# Patient Record
Sex: Female | Born: 1975
Health system: Southern US, Community
[De-identification: ages and names within clinical notes are randomized; demographics above are authoritative.]

## PROBLEM LIST (undated history)

## (undated) DIAGNOSIS — A599 Trichomoniasis, unspecified: Secondary | ICD-10-CM

## (undated) DIAGNOSIS — K602 Anal fissure, unspecified: Secondary | ICD-10-CM

## (undated) DIAGNOSIS — A499 Bacterial infection, unspecified: Secondary | ICD-10-CM

## (undated) DIAGNOSIS — B379 Candidiasis, unspecified: Secondary | ICD-10-CM

## (undated) DIAGNOSIS — N920 Excessive and frequent menstruation with regular cycle: Secondary | ICD-10-CM

## (undated) DIAGNOSIS — T7840XA Allergy, unspecified, initial encounter: Secondary | ICD-10-CM

## (undated) DIAGNOSIS — Z8619 Personal history of other infectious and parasitic diseases: Secondary | ICD-10-CM

## (undated) DIAGNOSIS — N809 Endometriosis, unspecified: Secondary | ICD-10-CM

## (undated) DIAGNOSIS — IMO0002 Reserved for concepts with insufficient information to code with codable children: Secondary | ICD-10-CM

## (undated) DIAGNOSIS — N760 Acute vaginitis: Secondary | ICD-10-CM

## (undated) DIAGNOSIS — K219 Gastro-esophageal reflux disease without esophagitis: Secondary | ICD-10-CM

## (undated) DIAGNOSIS — L100 Pemphigus vulgaris: Secondary | ICD-10-CM

## (undated) DIAGNOSIS — N898 Other specified noninflammatory disorders of vagina: Secondary | ICD-10-CM

## (undated) DIAGNOSIS — N946 Dysmenorrhea, unspecified: Secondary | ICD-10-CM

## (undated) HISTORY — PX: DILATION AND CURETTAGE OF UTERUS: SHX78

## (undated) HISTORY — DX: Trichomoniasis, unspecified: A59.9

## (undated) HISTORY — DX: Reserved for concepts with insufficient information to code with codable children: IMO0002

## (undated) HISTORY — DX: Other specified noninflammatory disorders of vagina: N89.8

## (undated) HISTORY — PX: TUBAL LIGATION: SHX77

## (undated) HISTORY — DX: Personal history of other infectious and parasitic diseases: Z86.19

## (undated) HISTORY — PX: WISDOM TOOTH EXTRACTION: SHX21

## (undated) HISTORY — DX: Allergy, unspecified, initial encounter: T78.40XA

## (undated) HISTORY — PX: OTHER SURGICAL HISTORY: SHX169

## (undated) HISTORY — DX: Candidiasis, unspecified: B37.9

## (undated) HISTORY — DX: Bacterial infection, unspecified: A49.9

---

## 1997-10-13 ENCOUNTER — Emergency Department (HOSPITAL_COMMUNITY): Admission: EM | Admit: 1997-10-13 | Discharge: 1997-10-13 | Payer: Self-pay | Admitting: Emergency Medicine

## 1997-11-24 ENCOUNTER — Inpatient Hospital Stay (HOSPITAL_COMMUNITY): Admission: AD | Admit: 1997-11-24 | Discharge: 1997-11-24 | Payer: Self-pay | Admitting: Family Medicine

## 1997-11-25 ENCOUNTER — Emergency Department (HOSPITAL_COMMUNITY): Admission: EM | Admit: 1997-11-25 | Discharge: 1997-11-25 | Payer: Self-pay | Admitting: Emergency Medicine

## 1997-12-06 ENCOUNTER — Emergency Department (HOSPITAL_COMMUNITY): Admission: EM | Admit: 1997-12-06 | Discharge: 1997-12-06 | Payer: Self-pay | Admitting: Emergency Medicine

## 1997-12-06 ENCOUNTER — Encounter: Payer: Self-pay | Admitting: Emergency Medicine

## 1998-03-04 ENCOUNTER — Emergency Department (HOSPITAL_COMMUNITY): Admission: EM | Admit: 1998-03-04 | Discharge: 1998-03-04 | Payer: Self-pay | Admitting: Emergency Medicine

## 1998-03-22 ENCOUNTER — Inpatient Hospital Stay (HOSPITAL_COMMUNITY): Admission: AD | Admit: 1998-03-22 | Discharge: 1998-03-22 | Payer: Self-pay | Admitting: *Deleted

## 1998-06-11 ENCOUNTER — Inpatient Hospital Stay (HOSPITAL_COMMUNITY): Admission: AD | Admit: 1998-06-11 | Discharge: 1998-06-11 | Payer: Self-pay | Admitting: Obstetrics

## 1998-06-12 ENCOUNTER — Inpatient Hospital Stay (HOSPITAL_COMMUNITY): Admission: AD | Admit: 1998-06-12 | Discharge: 1998-06-12 | Payer: Self-pay | Admitting: *Deleted

## 1998-10-01 ENCOUNTER — Inpatient Hospital Stay (HOSPITAL_COMMUNITY): Admission: AD | Admit: 1998-10-01 | Discharge: 1998-10-01 | Payer: Self-pay | Admitting: *Deleted

## 1998-10-17 ENCOUNTER — Inpatient Hospital Stay (HOSPITAL_COMMUNITY): Admission: AD | Admit: 1998-10-17 | Discharge: 1998-10-18 | Payer: Self-pay | Admitting: *Deleted

## 1998-10-20 ENCOUNTER — Emergency Department (HOSPITAL_COMMUNITY): Admission: EM | Admit: 1998-10-20 | Discharge: 1998-10-20 | Payer: Self-pay | Admitting: Emergency Medicine

## 1998-11-04 ENCOUNTER — Inpatient Hospital Stay (HOSPITAL_COMMUNITY): Admission: AD | Admit: 1998-11-04 | Discharge: 1998-11-04 | Payer: Self-pay | Admitting: *Deleted

## 1998-11-11 ENCOUNTER — Ambulatory Visit (HOSPITAL_COMMUNITY): Admission: RE | Admit: 1998-11-11 | Discharge: 1998-11-11 | Payer: Self-pay | Admitting: *Deleted

## 1998-11-15 ENCOUNTER — Inpatient Hospital Stay (HOSPITAL_COMMUNITY): Admission: AD | Admit: 1998-11-15 | Discharge: 1998-11-15 | Payer: Self-pay | Admitting: *Deleted

## 1998-11-16 ENCOUNTER — Encounter: Payer: Self-pay | Admitting: *Deleted

## 1998-11-16 ENCOUNTER — Inpatient Hospital Stay (HOSPITAL_COMMUNITY): Admission: AD | Admit: 1998-11-16 | Discharge: 1998-11-16 | Payer: Self-pay | Admitting: Obstetrics

## 1998-12-27 ENCOUNTER — Ambulatory Visit (HOSPITAL_COMMUNITY): Admission: RE | Admit: 1998-12-27 | Discharge: 1998-12-27 | Payer: Self-pay | Admitting: *Deleted

## 1999-02-01 ENCOUNTER — Inpatient Hospital Stay (HOSPITAL_COMMUNITY): Admission: AD | Admit: 1999-02-01 | Discharge: 1999-02-01 | Payer: Self-pay | Admitting: *Deleted

## 1999-03-11 ENCOUNTER — Inpatient Hospital Stay (HOSPITAL_COMMUNITY): Admission: AD | Admit: 1999-03-11 | Discharge: 1999-03-11 | Payer: Self-pay | Admitting: Obstetrics

## 1999-03-15 ENCOUNTER — Ambulatory Visit (HOSPITAL_COMMUNITY): Admission: RE | Admit: 1999-03-15 | Discharge: 1999-03-15 | Payer: Self-pay | Admitting: *Deleted

## 1999-04-04 ENCOUNTER — Inpatient Hospital Stay (HOSPITAL_COMMUNITY): Admission: AD | Admit: 1999-04-04 | Discharge: 1999-04-04 | Payer: Self-pay | Admitting: *Deleted

## 1999-04-14 ENCOUNTER — Inpatient Hospital Stay (HOSPITAL_COMMUNITY): Admission: AD | Admit: 1999-04-14 | Discharge: 1999-04-14 | Payer: Self-pay | Admitting: Obstetrics

## 1999-04-16 ENCOUNTER — Inpatient Hospital Stay (HOSPITAL_COMMUNITY): Admission: AD | Admit: 1999-04-16 | Discharge: 1999-04-16 | Payer: Self-pay | Admitting: Obstetrics & Gynecology

## 1999-04-17 ENCOUNTER — Inpatient Hospital Stay (HOSPITAL_COMMUNITY): Admission: AD | Admit: 1999-04-17 | Discharge: 1999-04-17 | Payer: Self-pay | Admitting: Obstetrics & Gynecology

## 1999-04-30 ENCOUNTER — Inpatient Hospital Stay (HOSPITAL_COMMUNITY): Admission: AD | Admit: 1999-04-30 | Discharge: 1999-04-30 | Payer: Self-pay | Admitting: Obstetrics & Gynecology

## 1999-05-06 ENCOUNTER — Inpatient Hospital Stay (HOSPITAL_COMMUNITY): Admission: AD | Admit: 1999-05-06 | Discharge: 1999-05-06 | Payer: Self-pay | Admitting: Obstetrics

## 1999-05-12 ENCOUNTER — Inpatient Hospital Stay (HOSPITAL_COMMUNITY): Admission: AD | Admit: 1999-05-12 | Discharge: 1999-05-12 | Payer: Self-pay | Admitting: Obstetrics

## 1999-05-12 ENCOUNTER — Ambulatory Visit (HOSPITAL_COMMUNITY): Admission: RE | Admit: 1999-05-12 | Discharge: 1999-05-12 | Payer: Self-pay | Admitting: *Deleted

## 1999-05-20 ENCOUNTER — Inpatient Hospital Stay (HOSPITAL_COMMUNITY): Admission: AD | Admit: 1999-05-20 | Discharge: 1999-05-20 | Payer: Self-pay | Admitting: Obstetrics & Gynecology

## 1999-05-22 ENCOUNTER — Encounter (INDEPENDENT_AMBULATORY_CARE_PROVIDER_SITE_OTHER): Payer: Self-pay | Admitting: Specialist

## 1999-05-22 ENCOUNTER — Inpatient Hospital Stay (HOSPITAL_COMMUNITY): Admission: AD | Admit: 1999-05-22 | Discharge: 1999-05-24 | Payer: Self-pay | Admitting: Obstetrics & Gynecology

## 1999-05-29 ENCOUNTER — Inpatient Hospital Stay (HOSPITAL_COMMUNITY): Admission: AD | Admit: 1999-05-29 | Discharge: 1999-05-29 | Payer: Self-pay | Admitting: Obstetrics

## 1999-08-28 ENCOUNTER — Emergency Department (HOSPITAL_COMMUNITY): Admission: EM | Admit: 1999-08-28 | Discharge: 1999-08-28 | Payer: Self-pay

## 2001-01-07 ENCOUNTER — Other Ambulatory Visit: Admission: RE | Admit: 2001-01-07 | Discharge: 2001-01-07 | Payer: Self-pay | Admitting: Obstetrics and Gynecology

## 2001-05-20 ENCOUNTER — Other Ambulatory Visit: Admission: RE | Admit: 2001-05-20 | Discharge: 2001-05-20 | Payer: Self-pay | Admitting: Obstetrics and Gynecology

## 2001-06-27 ENCOUNTER — Encounter (INDEPENDENT_AMBULATORY_CARE_PROVIDER_SITE_OTHER): Payer: Self-pay

## 2001-06-27 ENCOUNTER — Ambulatory Visit (HOSPITAL_COMMUNITY): Admission: RE | Admit: 2001-06-27 | Discharge: 2001-06-27 | Payer: Self-pay | Admitting: Obstetrics and Gynecology

## 2001-12-09 ENCOUNTER — Emergency Department (HOSPITAL_COMMUNITY): Admission: EM | Admit: 2001-12-09 | Discharge: 2001-12-10 | Payer: Self-pay | Admitting: Emergency Medicine

## 2001-12-10 ENCOUNTER — Emergency Department (HOSPITAL_COMMUNITY): Admission: EM | Admit: 2001-12-10 | Discharge: 2001-12-10 | Payer: Self-pay | Admitting: Emergency Medicine

## 2002-02-04 ENCOUNTER — Encounter: Payer: Self-pay | Admitting: Emergency Medicine

## 2002-02-04 ENCOUNTER — Emergency Department (HOSPITAL_COMMUNITY): Admission: EM | Admit: 2002-02-04 | Discharge: 2002-02-04 | Payer: Self-pay | Admitting: Emergency Medicine

## 2002-03-05 DIAGNOSIS — R87619 Unspecified abnormal cytological findings in specimens from cervix uteri: Secondary | ICD-10-CM

## 2002-03-05 DIAGNOSIS — IMO0002 Reserved for concepts with insufficient information to code with codable children: Secondary | ICD-10-CM

## 2002-03-05 HISTORY — DX: Reserved for concepts with insufficient information to code with codable children: IMO0002

## 2002-03-05 HISTORY — DX: Unspecified abnormal cytological findings in specimens from cervix uteri: R87.619

## 2002-12-21 ENCOUNTER — Emergency Department (HOSPITAL_COMMUNITY): Admission: EM | Admit: 2002-12-21 | Discharge: 2002-12-21 | Payer: Self-pay | Admitting: Emergency Medicine

## 2003-09-27 ENCOUNTER — Emergency Department (HOSPITAL_COMMUNITY): Admission: EM | Admit: 2003-09-27 | Discharge: 2003-09-27 | Payer: Self-pay | Admitting: Emergency Medicine

## 2004-11-15 ENCOUNTER — Inpatient Hospital Stay (HOSPITAL_COMMUNITY): Admission: AD | Admit: 2004-11-15 | Discharge: 2004-11-15 | Payer: Self-pay | Admitting: Family Medicine

## 2005-01-01 ENCOUNTER — Emergency Department (HOSPITAL_COMMUNITY): Admission: EM | Admit: 2005-01-01 | Discharge: 2005-01-01 | Payer: Self-pay | Admitting: Emergency Medicine

## 2005-01-05 ENCOUNTER — Emergency Department (HOSPITAL_COMMUNITY): Admission: EM | Admit: 2005-01-05 | Discharge: 2005-01-05 | Payer: Self-pay | Admitting: Family Medicine

## 2005-02-24 ENCOUNTER — Emergency Department (HOSPITAL_COMMUNITY): Admission: EM | Admit: 2005-02-24 | Discharge: 2005-02-24 | Payer: Self-pay | Admitting: Emergency Medicine

## 2005-04-12 ENCOUNTER — Emergency Department (HOSPITAL_COMMUNITY): Admission: EM | Admit: 2005-04-12 | Discharge: 2005-04-12 | Payer: Self-pay | Admitting: Family Medicine

## 2005-05-16 ENCOUNTER — Emergency Department (HOSPITAL_COMMUNITY): Admission: EM | Admit: 2005-05-16 | Discharge: 2005-05-16 | Payer: Self-pay | Admitting: Emergency Medicine

## 2005-05-31 ENCOUNTER — Emergency Department (HOSPITAL_COMMUNITY): Admission: EM | Admit: 2005-05-31 | Discharge: 2005-05-31 | Payer: Self-pay | Admitting: Family Medicine

## 2005-08-25 ENCOUNTER — Emergency Department (HOSPITAL_COMMUNITY): Admission: EM | Admit: 2005-08-25 | Discharge: 2005-08-25 | Payer: Self-pay | Admitting: Emergency Medicine

## 2005-09-17 ENCOUNTER — Emergency Department (HOSPITAL_COMMUNITY): Admission: EM | Admit: 2005-09-17 | Discharge: 2005-09-17 | Payer: Self-pay | Admitting: Family Medicine

## 2006-07-12 ENCOUNTER — Inpatient Hospital Stay (HOSPITAL_COMMUNITY): Admission: AD | Admit: 2006-07-12 | Discharge: 2006-07-12 | Payer: Self-pay | Admitting: Obstetrics & Gynecology

## 2006-08-12 ENCOUNTER — Emergency Department (HOSPITAL_COMMUNITY): Admission: EM | Admit: 2006-08-12 | Discharge: 2006-08-12 | Payer: Self-pay | Admitting: Family Medicine

## 2006-09-25 ENCOUNTER — Emergency Department (HOSPITAL_COMMUNITY): Admission: EM | Admit: 2006-09-25 | Discharge: 2006-09-25 | Payer: Self-pay | Admitting: Emergency Medicine

## 2007-04-08 DIAGNOSIS — N898 Other specified noninflammatory disorders of vagina: Secondary | ICD-10-CM

## 2007-04-08 HISTORY — DX: Other specified noninflammatory disorders of vagina: N89.8

## 2007-05-18 ENCOUNTER — Emergency Department (HOSPITAL_COMMUNITY): Admission: EM | Admit: 2007-05-18 | Discharge: 2007-05-18 | Payer: Self-pay | Admitting: Emergency Medicine

## 2008-03-06 ENCOUNTER — Emergency Department (HOSPITAL_COMMUNITY): Admission: EM | Admit: 2008-03-06 | Discharge: 2008-03-06 | Payer: Self-pay | Admitting: Emergency Medicine

## 2008-03-24 ENCOUNTER — Inpatient Hospital Stay (HOSPITAL_COMMUNITY): Admission: AD | Admit: 2008-03-24 | Discharge: 2008-03-24 | Payer: Self-pay | Admitting: Anesthesiology

## 2008-03-28 ENCOUNTER — Encounter (INDEPENDENT_AMBULATORY_CARE_PROVIDER_SITE_OTHER): Payer: Self-pay | Admitting: Emergency Medicine

## 2008-03-28 ENCOUNTER — Emergency Department (HOSPITAL_COMMUNITY): Admission: EM | Admit: 2008-03-28 | Discharge: 2008-03-28 | Payer: Self-pay | Admitting: Emergency Medicine

## 2008-03-28 ENCOUNTER — Ambulatory Visit: Payer: Self-pay | Admitting: Vascular Surgery

## 2008-10-19 ENCOUNTER — Emergency Department (HOSPITAL_COMMUNITY): Admission: EM | Admit: 2008-10-19 | Discharge: 2008-10-19 | Payer: Self-pay | Admitting: Emergency Medicine

## 2008-10-23 ENCOUNTER — Emergency Department (HOSPITAL_COMMUNITY): Admission: EM | Admit: 2008-10-23 | Discharge: 2008-10-23 | Payer: Self-pay | Admitting: Family Medicine

## 2008-11-28 ENCOUNTER — Emergency Department (HOSPITAL_COMMUNITY): Admission: EM | Admit: 2008-11-28 | Discharge: 2008-11-28 | Payer: Self-pay | Admitting: Emergency Medicine

## 2008-12-01 ENCOUNTER — Emergency Department (HOSPITAL_COMMUNITY): Admission: EM | Admit: 2008-12-01 | Discharge: 2008-12-01 | Payer: Self-pay | Admitting: Family Medicine

## 2008-12-11 ENCOUNTER — Emergency Department (HOSPITAL_BASED_OUTPATIENT_CLINIC_OR_DEPARTMENT_OTHER): Admission: EM | Admit: 2008-12-11 | Discharge: 2008-12-11 | Payer: Self-pay | Admitting: Emergency Medicine

## 2008-12-14 ENCOUNTER — Ambulatory Visit: Payer: Self-pay | Admitting: Advanced Practice Midwife

## 2008-12-14 ENCOUNTER — Inpatient Hospital Stay (HOSPITAL_COMMUNITY): Admission: AD | Admit: 2008-12-14 | Discharge: 2008-12-14 | Payer: Self-pay | Admitting: Obstetrics & Gynecology

## 2009-08-14 ENCOUNTER — Emergency Department (HOSPITAL_COMMUNITY): Admission: EM | Admit: 2009-08-14 | Discharge: 2009-08-14 | Payer: Self-pay | Admitting: Emergency Medicine

## 2010-03-18 ENCOUNTER — Emergency Department (HOSPITAL_COMMUNITY)
Admission: EM | Admit: 2010-03-18 | Discharge: 2010-03-18 | Payer: Self-pay | Source: Home / Self Care | Admitting: Emergency Medicine

## 2010-05-29 DIAGNOSIS — R946 Abnormal results of thyroid function studies: Secondary | ICD-10-CM | POA: Insufficient documentation

## 2010-05-29 DIAGNOSIS — E559 Vitamin D deficiency, unspecified: Secondary | ICD-10-CM | POA: Insufficient documentation

## 2010-06-08 LAB — WET PREP, GENITAL
Trich, Wet Prep: NONE SEEN
Yeast Wet Prep HPF POC: NONE SEEN

## 2010-06-10 LAB — BASIC METABOLIC PANEL
BUN: 9 mg/dL (ref 6–23)
CO2: 26 mEq/L (ref 19–32)
Calcium: 8.6 mg/dL (ref 8.4–10.5)
Chloride: 105 mEq/L (ref 96–112)
Creatinine, Ser: 0.74 mg/dL (ref 0.4–1.2)
GFR calc Af Amer: >60 mL/min (ref >60)
GFR calc non Af Amer: >60 mL/min (ref >60)
Glucose, Bld: 96 mg/dL (ref 70–99)
Potassium: 3.8 mEq/L (ref 3.5–5.1)
Sodium: 136 mEq/L (ref 135–145)

## 2010-06-10 LAB — CBC
HCT: 35.5 % — ABNORMAL LOW (ref 36.0–46.0)
Hemoglobin: 12.3 g/dL (ref 12.0–15.0)
MCHC: 34.6 g/dL (ref 30.0–36.0)
MCV: 93.7 fL (ref 78.0–100.0)
Platelets: 337 10*3/uL (ref 150–400)
RBC: 3.79 MIL/uL — ABNORMAL LOW (ref 3.87–5.11)
RDW: 12.8 % (ref 11.5–15.5)
WBC: 5.6 10*3/uL (ref 4.0–10.5)

## 2010-06-10 LAB — DIFFERENTIAL
Basophils Absolute: 0 10*3/uL (ref 0.0–0.1)
Basophils Relative: 0 % (ref 0–1)
Eosinophils Absolute: 0.1 10*3/uL (ref 0.0–0.7)
Eosinophils Relative: 1 % (ref 0–5)
Lymphocytes Relative: 30 % (ref 12–46)
Lymphs Abs: 1.7 10*3/uL (ref 0.7–4.0)
Monocytes Absolute: 0.5 10*3/uL (ref 0.1–1.0)
Monocytes Relative: 10 % (ref 3–12)
Neutro Abs: 3.3 10*3/uL (ref 1.7–7.7)
Neutrophils Relative %: 59 % (ref 43–77)

## 2010-06-10 LAB — URINALYSIS, ROUTINE W REFLEX MICROSCOPIC
Bilirubin Urine: NEGATIVE
Bilirubin Urine: NEGATIVE
Glucose, UA: NEGATIVE mg/dL
Glucose, UA: NEGATIVE mg/dL
Hgb urine dipstick: NEGATIVE
Hgb urine dipstick: NEGATIVE
Ketones, ur: NEGATIVE mg/dL
Nitrite: NEGATIVE
Nitrite: NEGATIVE
Protein, ur: NEGATIVE mg/dL
Protein, ur: NEGATIVE mg/dL
Specific Gravity, Urine: 1.012 (ref 1.005–1.030)
Specific Gravity, Urine: 1.031 — ABNORMAL HIGH (ref 1.005–1.030)
Urobilinogen, UA: 0.2 mg/dL (ref 0.0–1.0)
Urobilinogen, UA: 0.2 mg/dL (ref 0.0–1.0)
pH: 6 (ref 5.0–8.0)
pH: 7 (ref 5.0–8.0)

## 2010-06-10 LAB — PREGNANCY, URINE: Preg Test, Ur: NEGATIVE

## 2010-06-10 LAB — POCT PREGNANCY, URINE: Preg Test, Ur: NEGATIVE

## 2010-06-19 LAB — URINALYSIS, ROUTINE W REFLEX MICROSCOPIC
Bilirubin Urine: NEGATIVE
Glucose, UA: NEGATIVE mg/dL
Hgb urine dipstick: NEGATIVE
Ketones, ur: NEGATIVE mg/dL
Nitrite: NEGATIVE
Protein, ur: NEGATIVE mg/dL
Specific Gravity, Urine: >1.03 — ABNORMAL HIGH (ref 1.005–1.030)
Urobilinogen, UA: 0.2 mg/dL (ref 0.0–1.0)
pH: 6 (ref 5.0–8.0)

## 2010-06-19 LAB — POCT PREGNANCY, URINE: Preg Test, Ur: NEGATIVE

## 2010-06-19 LAB — WET PREP, GENITAL
Clue Cells Wet Prep HPF POC: NONE SEEN
Trich, Wet Prep: NONE SEEN
Yeast Wet Prep HPF POC: NONE SEEN

## 2010-06-19 LAB — GC/CHLAMYDIA PROBE AMP, GENITAL
Chlamydia, DNA Probe: NEGATIVE
GC Probe Amp, Genital: NEGATIVE

## 2010-07-21 NOTE — Op Note (Signed)
[facility]  Patient:    Christina Allen, Christina Allen                          MRN: [id number] Proc. Date: 05/23/99 Adm. Date:  [id number] Attending:  Antionette Char                           Operative Report  PREOPERATIVE DIAGNOSIS:       Desire for surgical sterilization.  POSTOPERATIVE DIAGNOSIS:      Desire for surgical sterilization.  OPERATION:                    Modified bilateral Pomeroy tubal ligation.  SURGEON:                      Conni Elliot, M.D.  ASSISTANT:  ANESTHESIA:  ESTIMATED BLOOD LOSS:         Approximately 10 cc.  OPERATIVE FINDINGS:           Uterus, tubes and ovaries were normal for post gravid state; however, there were adhesions to the omentum overlying the adnexa as well as adhesions to the anterior abdominal wall of the omentum.  INDICATIONS:  DESCRIPTION OF PROCEDURE:     After continuous lumbar epidural anesthetic, the patient supine in the left lateral tilt position, the abdomen was prepped and draped in a sterile fashion.  A periumbilical incision was made.  Incision was ade through the skin and subcutaneous fascia.  Cavity entered. The right fallopian ube was identified, grasped with Babcock clamps and followed out to its fimbriated nd. Segment of tube was brought into the operative field, double suture ligated and an approximately 1 cm segment was incised and hemostasis was adequate.  The same procedure was done on the left. Hemostasis was adequate.  Anterior peritoneum and fascia in routine fashion.  Needle and instrument was correct. DD:  05/23/99 TD:  05/24/99 Job: [id number]

## 2010-07-21 NOTE — Discharge Summary (Signed)
[facility] [place]  Patient:    Christina Allen, Christina Allen                          MRN: [id number] Adm. Date:  [id number] Disc. Date: [id number] Attending:  Antionette Char Dictator:   Pollyann Savoy, P.A.-S.                           Discharge Summary  HISTORY OF PRESENT ILLNESS:   This patient is a 35 year old, gravida 3, para 2-1-0-3, admitted at 39-6/7 weeks for the onset of labor.  HOSPITAL COURSE:              The patient progressed to completion and delivered a viable female under an epidural anesthesia, NSVD. The infants Apgars were 9 at ne minute and 9 at five minutes.  Nuchal cord x 1 was reduced.  Estimated blood loss was about 500 cc.  There were no tears or abrasions and no complications.  The following day, May 23, 1999, the patient underwent BTL performed by Conni Elliot, M.D.  The procedure was successful with no complications.  On physical  examination, the patient is a well-dressed, well-nourished female in no acute distress.  Afebrile, vital signs are stable.  Heart is regular rate and rhythm.  Lungs are clear to auscultation bilaterally.  The abdomen was gravid and nontender. On extremities, there was no clubbing, cyanosis, or edema.  Reflexes were 2+, pulses 2+ with no clonus.  On admission, the patients cervical examination was about 3 to 4 cm, 60% effaced, station -1 and the patient was contracting regularly about every four to six minutes with no decellerations.  Fetal heart rate in the 140s to 160s, strip reassuring.  There were no complications during the hospitalization.  The patient will breastfeed her female infant.  Postoperative CBC showed an H&H of 10.2 and 28.9.  The patient was discharged on May 24, 1999, n good condition.  The patient was discharged to home accompanied by her female infant.  DISCHARGE MEDICATIONS:        1. Tylox one to two q.4-6h. p.r.n.                               2. Ibuprofen 600 mg q.6h.                         3. Iron sulfate 325 mg b.i.d.                               4. Prenatal vitamins one q.d.  The patient was given routine instructions for pelvic rest x 6 weeks, regular diet, and signs and symptoms of infection wound care for her BTL.  The patient will follow up at [facility] in six weeks. DD:  05/25/99 TD:  05/25/99 Job: [id number]

## 2010-07-21 NOTE — H&P (Signed)
[facility]  Patient:    Christina Allen, Christina Allen Visit Number: [id number] MRN: [id number]          Service Type: DSU Location: [facility] Attending Physician:  Wandalee Ferdinand Dictated by:   Rudy Jew Ashley Royalty, M.D. Admit Date:  06/27/2001                           History and Physical  HISTORY OF PRESENT ILLNESS:   This is a 35 year old gravida 3, para 3, who presented for colposcopy June 09, 2001, due to a recent Pap smear in March 2003 revealing CIN-I after a December 2002 LEEP.  Exam was limited to colposcopy on June 09, 2001.  The study showed that the squamocolumnar junction was somewhat recessed apparently as a consequence of her previous LEEP.  There were no _____ lesions noted.  The cervix was then bathed with 5% acetic acid and revisualized through the colposcope.  Colposcopically- directed biopsies revealed CIN-I.  The operator wondered if he could see areas of white epithelium at the squamocolumnar junction at 11 and 5 oclock. However, the exam was difficult due to the recessed nature of the squamocolumnar junction.  The pathology returned December 2002 showing mild dysplasia, margins not involved.  The patient was admitted for cold knife conization.  MEDICATIONS:                  Prednisone.  PAST MEDICAL HISTORY:         Pemphigus vulgaris.  ALLERGIES:                    PENICILLIN.  FAMILY HISTORY:               Noncontributory.  SOCIAL HISTORY:               The patient denies the use of significant alcohol.  She does smoke cigarettes.  REVIEW OF SYSTEMS:            Noncontributory.  PHYSICAL EXAMINATION:  VITAL SIGNS:                  Afebrile.  Vital signs stable.  GENERAL:                      A well-developed, well-nourished white female in no acute distress.  SKIN:                         Warm and dry without lesions.  LYMPHATIC:                    There was no supraclavicular, cervical, or inguinal adenopathy.  HEENT:                         Normocephalic.  NECK:                         Supple without thyromegaly.  CHEST:                        Lungs clear.  CARDIAC:                      Regular rate and rhythm without murmurs, gallops, or rubs.  BREASTS:  Deferred.  ABDOMEN:                      Soft and nontender without masses or organomegaly.  Bowel sounds were active.  MUSCULOSKELETAL:              Full range of motion without any cyanosis or significant edema or tenderness.  PELVIC:                       External genitalia is within normal limits.  The vagina and cervix are without gross lesions.  Pap is done.  Bimanual examination reveals the uterus to be approximately 8 x 4.4 cm.  No adnexal masses were palpable.  Rectovaginal exam confirms.  IMPRESSION:                   1. History of loop electrosurgical excision                                  procedure for cervical intraepithelial                                  neoplasia.                               2. Cervical intraepithelial neoplasia I on                                  Papanicolaou smear with colposcopically-                                  directed biopsy revealing rare detached                                  atypical squamous fragments suspicious for                                  low-grade dysplasia.  PLAN:                         As patient has already had a LEEP in the past and a tall, skinny cone is recommended, the procedure will be to allow the conization.  The risks, benefits of conization were fully discussed.  The patient states she understands and accepts.  Questions invited and answered. Dictated by:   Rudy Jew Ashley Royalty, M.D. Attending Physician:  Wandalee Ferdinand DD:  06/27/01 TD:  06/27/01 Job: [id number]

## 2010-07-21 NOTE — Op Note (Signed)
[facility]  Patient:    Christina Allen, Christina Allen Visit Number: [id number] MRN: [id number]          Service Type: DSU Location: [facility] Attending Physician:  Wandalee Ferdinand Dictated by:   Rudy Jew Ashley Royalty, M.D. Proc. Date: 06/27/01 Admit Date:  06/27/2001                             Operative Report  PREOPERATIVE DIAGNOSIS:       1. CIN-I with detached atypical squamous fragments. Pathology is pending.                               2. Inadequate colposcopy.  POSTOPERATIVE DIAGNOSES:      1. CIN-I with detached atypical squamous fragments.  Pathology is pending.                               2. Inadequate colposcopy.  PROCEDURE:                    Cold knife conization.  SURGEON:                      Rudy Jew. Ashley Royalty, M.D.  ANESTHESIA:                   Monitored anesthesia care with 1% Xylocaine paracervical block (10 cc).  ESTIMATED BLOOD LOSS:         50 cc.  COMPLICATIONS:                None.  PACKS/DRAINS:                 None.  DESCRIPTION OF PROCEDURE:     The patient was taken to the operating room and placed in the dorsal supine position.  After adequate IV sedation was administered, she was placed in the lithotomy position and prepped and draped in the usual sterile fashion for vaginal surgery.  Hibiclens was used instead of Betadine.  The cervix was grasped at 3 and 9 oclock and hemostatic sutures of 2-0 chromic were placed without difficulty.  The cervix was then bathed with Lugol solution.  There were no nonstaining areas noted.  Next, a tall skinny cone was taken circumferentially around the cervix with the #11 blade.  It was cut off high in the cervical canal with Mayo scissors; submitted to pathology for histologic studies.  It was marked at 12 oclock with a suture.  Next, an attempt was made to cauterize the cone; however, it was not cooperative in terms of bleeding cessation.  Hence, four quadrant sutures of 2-0 chromic  were placed, each occupying one quadrant.  Hemostasis was noted. The procedure was then terminated.  All vaginal instruments were removed and the patient was sent to recovery room in excellent condition. Dictated by:   Rudy Jew Ashley Royalty, M.D. Attending Physician:  Wandalee Ferdinand DD:  06/27/01 TD:  06/28/01 Job: [id number]

## 2010-09-28 ENCOUNTER — Inpatient Hospital Stay (INDEPENDENT_AMBULATORY_CARE_PROVIDER_SITE_OTHER)
Admission: RE | Admit: 2010-09-28 | Discharge: 2010-09-28 | Disposition: A | Discharge status: Home / Self Care | Payer: Medicaid Other | Source: Ambulatory Visit | Attending: Family Medicine | Admitting: Family Medicine

## 2010-09-28 DIAGNOSIS — N390 Urinary tract infection, site not specified: Secondary | ICD-10-CM

## 2010-09-28 LAB — POCT URINALYSIS DIP (DEVICE)
Bilirubin Urine: NEGATIVE
Glucose, UA: NEGATIVE mg/dL
Ketones, ur: NEGATIVE mg/dL
Leukocytes, UA: NEGATIVE
Nitrite: POSITIVE — AB
Protein, ur: NEGATIVE mg/dL
Specific Gravity, Urine: 1.025 (ref 1.005–1.030)
Urobilinogen, UA: 0.2 mg/dL (ref 0.0–1.0)
pH: 5.5 (ref 5.0–8.0)

## 2010-09-28 LAB — POCT PREGNANCY, URINE: Preg Test, Ur: NEGATIVE

## 2010-11-27 LAB — URINALYSIS, ROUTINE W REFLEX MICROSCOPIC
Bilirubin Urine: NEGATIVE
Glucose, UA: NEGATIVE
Hgb urine dipstick: NEGATIVE
Ketones, ur: NEGATIVE
Nitrite: NEGATIVE
Protein, ur: NEGATIVE
Specific Gravity, Urine: 1.024
Urobilinogen, UA: 0.2
pH: 6.5

## 2010-11-27 LAB — CBC
HCT: 35.7 — ABNORMAL LOW
Hemoglobin: 12.4
MCHC: 34.8
MCV: 92.9
Platelets: 357
RBC: 3.84 — ABNORMAL LOW
RDW: 12.5
WBC: 3.4 — ABNORMAL LOW

## 2010-11-27 LAB — DIFFERENTIAL
Basophils Absolute: 0
Basophils Relative: 1
Eosinophils Absolute: 0.1
Eosinophils Relative: 2
Lymphocytes Relative: 41
Lymphs Abs: 1.4
Monocytes Absolute: 0.4
Monocytes Relative: 11
Neutro Abs: 1.5 — ABNORMAL LOW
Neutrophils Relative %: 45

## 2010-11-27 LAB — WET PREP, GENITAL
Clue Cells Wet Prep HPF POC: NONE SEEN
Trich, Wet Prep: NONE SEEN
Yeast Wet Prep HPF POC: NONE SEEN

## 2010-11-27 LAB — RPR: RPR Ser Ql: NONREACTIVE

## 2010-11-27 LAB — GC/CHLAMYDIA PROBE AMP, GENITAL
Chlamydia, DNA Probe: NEGATIVE
GC Probe Amp, Genital: NEGATIVE

## 2010-11-27 LAB — PREGNANCY, URINE: Preg Test, Ur: NEGATIVE

## 2010-12-18 LAB — URINE MICROSCOPIC-ADD ON

## 2010-12-18 LAB — WET PREP, GENITAL: Yeast Wet Prep HPF POC: NONE SEEN

## 2010-12-18 LAB — GC/CHLAMYDIA PROBE AMP, GENITAL
Chlamydia, DNA Probe: NEGATIVE
GC Probe Amp, Genital: NEGATIVE

## 2010-12-18 LAB — URINALYSIS, ROUTINE W REFLEX MICROSCOPIC
Bilirubin Urine: NEGATIVE
Glucose, UA: NEGATIVE
Hgb urine dipstick: NEGATIVE
Ketones, ur: NEGATIVE
Nitrite: NEGATIVE
Protein, ur: NEGATIVE
Specific Gravity, Urine: 1.015
Urobilinogen, UA: 1
pH: 7

## 2010-12-18 LAB — RPR: RPR Ser Ql: NONREACTIVE

## 2010-12-18 LAB — PREGNANCY, URINE: Preg Test, Ur: NEGATIVE

## 2010-12-21 LAB — POCT URINALYSIS DIP (DEVICE)
Bilirubin Urine: NEGATIVE
Glucose, UA: NEGATIVE
Ketones, ur: NEGATIVE
Nitrite: NEGATIVE
Operator id: 235561
Protein, ur: 30 — AB
Specific Gravity, Urine: >=1.03
Urobilinogen, UA: 1
pH: 6

## 2010-12-21 LAB — GC/CHLAMYDIA PROBE AMP, GENITAL
Chlamydia, DNA Probe: NEGATIVE
GC Probe Amp, Genital: POSITIVE — AB

## 2010-12-21 LAB — WET PREP, GENITAL: Yeast Wet Prep HPF POC: NONE SEEN

## 2010-12-21 LAB — POCT PREGNANCY, URINE
Operator id: 235561
Preg Test, Ur: NEGATIVE

## 2011-03-05 ENCOUNTER — Other Ambulatory Visit: Payer: Medicaid Other

## 2011-03-05 ENCOUNTER — Other Ambulatory Visit: Payer: Self-pay | Admitting: Orthopaedic Surgery

## 2011-03-05 ENCOUNTER — Ambulatory Visit
Admission: RE | Admit: 2011-03-05 | Discharge: 2011-03-05 | Disposition: A | Discharge status: Home / Self Care | Payer: Medicaid Other | Source: Ambulatory Visit | Attending: Orthopaedic Surgery | Admitting: Orthopaedic Surgery

## 2011-03-05 DIAGNOSIS — M25569 Pain in unspecified knee: Secondary | ICD-10-CM

## 2011-06-10 ENCOUNTER — Encounter (HOSPITAL_COMMUNITY): Payer: Self-pay | Admitting: *Deleted

## 2011-06-10 ENCOUNTER — Emergency Department (HOSPITAL_COMMUNITY)
Admission: EM | Admit: 2011-06-10 | Discharge: 2011-06-10 | Disposition: A | Discharge status: Home / Self Care | Payer: Medicaid Other | Attending: Emergency Medicine | Admitting: Emergency Medicine

## 2011-06-10 DIAGNOSIS — K5289 Other specified noninfective gastroenteritis and colitis: Secondary | ICD-10-CM | POA: Insufficient documentation

## 2011-06-10 DIAGNOSIS — K529 Noninfective gastroenteritis and colitis, unspecified: Secondary | ICD-10-CM

## 2011-06-10 DIAGNOSIS — K219 Gastro-esophageal reflux disease without esophagitis: Secondary | ICD-10-CM | POA: Insufficient documentation

## 2011-06-10 HISTORY — DX: Gastro-esophageal reflux disease without esophagitis: K21.9

## 2011-06-10 LAB — COMPREHENSIVE METABOLIC PANEL
ALT: 17 U/L (ref 0–35)
AST: 21 U/L (ref 0–37)
Albumin: 4 g/dL (ref 3.5–5.2)
Alkaline Phosphatase: 77 U/L (ref 39–117)
BUN: 15 mg/dL (ref 6–23)
CO2: 20 mEq/L (ref 19–32)
Calcium: 9.4 mg/dL (ref 8.4–10.5)
Chloride: 105 mEq/L (ref 96–112)
Creatinine, Ser: 0.87 mg/dL (ref 0.50–1.10)
GFR calc Af Amer: >90 mL/min (ref >90)
GFR calc non Af Amer: 87 mL/min — ABNORMAL LOW (ref >90)
Glucose, Bld: 98 mg/dL (ref 70–99)
Potassium: 4.1 mEq/L (ref 3.5–5.1)
Sodium: 137 mEq/L (ref 135–145)
Total Bilirubin: 0.4 mg/dL (ref 0.3–1.2)
Total Protein: 7.7 g/dL (ref 6.0–8.3)

## 2011-06-10 LAB — LIPASE, BLOOD: Lipase: 20 U/L (ref 11–59)

## 2011-06-10 LAB — DIFFERENTIAL
Basophils Absolute: 0 10*3/uL (ref 0.0–0.1)
Basophils Relative: 0 % (ref 0–1)
Eosinophils Absolute: 0 10*3/uL (ref 0.0–0.7)
Eosinophils Relative: 0 % (ref 0–5)
Lymphocytes Relative: 13 % (ref 12–46)
Lymphs Abs: 1.2 10*3/uL (ref 0.7–4.0)
Monocytes Absolute: 0.6 10*3/uL (ref 0.1–1.0)
Monocytes Relative: 6 % (ref 3–12)
Neutro Abs: 7.4 10*3/uL (ref 1.7–7.7)
Neutrophils Relative %: 81 % — ABNORMAL HIGH (ref 43–77)

## 2011-06-10 LAB — URINALYSIS, ROUTINE W REFLEX MICROSCOPIC
Bilirubin Urine: NEGATIVE
Glucose, UA: NEGATIVE mg/dL
Hgb urine dipstick: NEGATIVE
Ketones, ur: NEGATIVE mg/dL
Leukocytes, UA: NEGATIVE
Nitrite: NEGATIVE
Protein, ur: NEGATIVE mg/dL
Specific Gravity, Urine: 1.029 (ref 1.005–1.030)
Urobilinogen, UA: 0.2 mg/dL (ref 0.0–1.0)
pH: 5.5 (ref 5.0–8.0)

## 2011-06-10 LAB — CBC
HCT: 40.3 % (ref 36.0–46.0)
Hemoglobin: 14.3 g/dL (ref 12.0–15.0)
MCH: 31.7 pg (ref 26.0–34.0)
MCHC: 35.5 g/dL (ref 30.0–36.0)
MCV: 89.4 fL (ref 78.0–100.0)
Platelets: 395 10*3/uL (ref 150–400)
RBC: 4.51 MIL/uL (ref 3.87–5.11)
RDW: 12.6 % (ref 11.5–15.5)
WBC: 9.2 10*3/uL (ref 4.0–10.5)

## 2011-06-10 LAB — POCT PREGNANCY, URINE: Preg Test, Ur: NEGATIVE

## 2011-06-10 MED ORDER — SODIUM CHLORIDE 0.9 % IV BOLUS (SEPSIS)
1000.0000 mL | Freq: Once | INTRAVENOUS | Status: AC
  Administered 2011-06-10: 1000 mL via INTRAVENOUS

## 2011-06-10 MED ORDER — ONDANSETRON HCL 4 MG PO TABS: 4.0000 mg | ORAL_TABLET | Freq: Four times a day (QID) | ORAL | Status: AC

## 2011-06-10 MED ORDER — TRAMADOL HCL 50 MG PO TABS: 50.0000 mg | ORAL_TABLET | Freq: Four times a day (QID) | ORAL | Status: AC | PRN

## 2011-06-10 MED ORDER — MORPHINE SULFATE 4 MG/ML IJ SOLN
4.0000 mg | Freq: Once | INTRAMUSCULAR | Status: AC
  Administered 2011-06-10: 4 mg via INTRAVENOUS
  Filled 2011-06-10: qty 1

## 2011-06-10 MED ORDER — ONDANSETRON HCL 4 MG/2ML IJ SOLN
4.0000 mg | Freq: Once | INTRAMUSCULAR | Status: AC
  Administered 2011-06-10: 4 mg via INTRAVENOUS
  Filled 2011-06-10: qty 2

## 2011-06-10 NOTE — ED Provider Notes (Signed)
Medical screening examination/treatment/procedure(s) were performed by non-physician practitioner and as supervising physician I was immediately available for consultation/collaboration.   Carleene Cooper III, MD 06/10/11 2239

## 2011-06-10 NOTE — ED Provider Notes (Signed)
History     CSN: [id number]  Arrival date & time 06/10/11  1136   First MD Initiated Contact with Patient 06/10/11 1249      Chief Complaint  Patient presents with  . Abdominal Pain  . Diarrhea  . Emesis    (Consider location/radiation/quality/duration/timing/severity/associated sxs/prior treatment) HPI  36 year old female presents with a chief complaints of nausea, vomiting, diarrhea, and abdominal pain. Patient states she was at a cookout yesterday. States she did eat a moderate amount of food. This morning when she woke up she experienced diffuse abdominal pain with associated nausea, nonbloody nonbilious vomiting, and nonbloody diarrhea. Onset was this morning, Symptoms has been persistent, nothing makes her worse, fetal position seems to make the pain better. She has not tried any other thing to help alleviate symptoms. Patient denies fever, chest pain, shortness of breath, back pain, dysuria, vaginal discharge, or rash. Her last menstrual period was 3 weeks ago. She is not sexually active. She has a history of GERD but states this does not feel like GERD. Patient believes her pain is likely from eating at a cookout yesterday. Patient does have prior abdominal surgery including tubal ligation and C-section.   Past Medical History  Diagnosis Date  . GERD (gastroesophageal reflux disease)     Past Surgical History  Procedure Date  . Other surgical history     tubal ligation and c-section    No family history on file.  History  Substance Use Topics  . Smoking status: Former Games developer  . Smokeless tobacco: Not on file  . Alcohol Use: No    OB History    Grav Para Term Preterm Abortions TAB SAB Ect Mult Living                  Review of Systems  All other systems reviewed and are negative.    Allergies  Review of patient's allergies indicates no known allergies.  Home Medications   Current Outpatient Rx  Name Route Sig Dispense Refill  . DEXLANSOPRAZOLE 30 MG  PO CPDR Oral Take 30 mg by mouth daily.      BP 104/80  Pulse 94  Temp(Src) 98 F (36.7 C) (Oral)  Resp 28  SpO2 98%  LMP 05/23/2011  Physical Exam  Nursing note and vitals reviewed. Constitutional: She appears well-developed and well-nourished. No distress.       Awake, alert, nontoxic appearance  HENT:  Head: Atraumatic.  Eyes: Conjunctivae are normal. Right eye exhibits no discharge. Left eye exhibits no discharge.  Neck: Neck supple.  Cardiovascular: Normal rate and regular rhythm.   Pulmonary/Chest: Effort normal. No respiratory distress. She exhibits no tenderness.  Abdominal: Soft. There is tenderness. There is no rebound.       Diffuse abdominal pain without guarding or rebound tenderness. No overlying skin changes. No mass noted. No hernia appreciated.  Musculoskeletal: She exhibits no tenderness.       ROM appears intact, no obvious focal weakness  Neurological: She is alert.       Mental status and motor strength appears intact  Skin: No rash noted.  Psychiatric: She has a normal mood and affect.    ED Course  Procedures (including critical care time)   Labs Reviewed  URINALYSIS, ROUTINE W REFLEX MICROSCOPIC   No results found.   No diagnosis found.  Results for orders placed during the hospital encounter of 06/10/11  URINALYSIS, ROUTINE W REFLEX MICROSCOPIC      Component Value Range   Color, Urine  YELLOW  YELLOW    APPearance HAZY (*) CLEAR    Specific Gravity, Urine 1.029  1.005 - 1.030    pH 5.5  5.0 - 8.0    Glucose, UA NEGATIVE  NEGATIVE (mg/dL)   Hgb urine dipstick NEGATIVE  NEGATIVE    Bilirubin Urine NEGATIVE  NEGATIVE    Ketones, ur NEGATIVE  NEGATIVE (mg/dL)   Protein, ur NEGATIVE  NEGATIVE (mg/dL)   Urobilinogen, UA 0.2  0.0 - 1.0 (mg/dL)   Nitrite NEGATIVE  NEGATIVE    Leukocytes, UA NEGATIVE  NEGATIVE   POCT PREGNANCY, URINE      Component Value Range   Preg Test, Ur NEGATIVE  NEGATIVE   CBC      Component Value Range   WBC 9.2   4.0 - 10.5 (K/uL)   RBC 4.51  3.87 - 5.11 (MIL/uL)   Hemoglobin 14.3  12.0 - 15.0 (g/dL)   HCT 16.1  09.6 - 04.5 (%)   MCV 89.4  78.0 - 100.0 (fL)   MCH 31.7  26.0 - 34.0 (pg)   MCHC 35.5  30.0 - 36.0 (g/dL)   RDW 40.9  81.1 - 91.4 (%)   Platelets 395  150 - 400 (K/uL)  DIFFERENTIAL      Component Value Range   Neutrophils Relative 81 (*) 43 - 77 (%)   Neutro Abs 7.4  1.7 - 7.7 (K/uL)   Lymphocytes Relative 13  12 - 46 (%)   Lymphs Abs 1.2  0.7 - 4.0 (K/uL)   Monocytes Relative 6  3 - 12 (%)   Monocytes Absolute 0.6  0.1 - 1.0 (K/uL)   Eosinophils Relative 0  0 - 5 (%)   Eosinophils Absolute 0.0  0.0 - 0.7 (K/uL)   Basophils Relative 0  0 - 1 (%)   Basophils Absolute 0.0  0.0 - 0.1 (K/uL)  COMPREHENSIVE METABOLIC PANEL      Component Value Range   Sodium 137  135 - 145 (mEq/L)   Potassium 4.1  3.5 - 5.1 (mEq/L)   Chloride 105  96 - 112 (mEq/L)   CO2 20  19 - 32 (mEq/L)   Glucose, Bld 98  70 - 99 (mg/dL)   BUN 15  6 - 23 (mg/dL)   Creatinine, Ser 7.82  0.50 - 1.10 (mg/dL)   Calcium 9.4  8.4 - 95.6 (mg/dL)   Total Protein 7.7  6.0 - 8.3 (g/dL)   Albumin 4.0  3.5 - 5.2 (g/dL)   AST PENDING  0 - 37 (U/L)   ALT 17  0 - 35 (U/L)   Alkaline Phosphatase 77  39 - 117 (U/L)   Total Bilirubin 0.4  0.3 - 1.2 (mg/dL)   GFR calc non Af Amer 87 (*) >90 (mL/min)   GFR calc Af Amer >90  >90 (mL/min)  LIPASE, BLOOD      Component Value Range   Lipase 20  11 - 59 (U/L)   No results found.    MDM  Abdominal pain with nausea vomiting and diarrhea. Abdomen is diffusely tender without guarding or rebound tenderness. Patient states uncomfortable but in no acute distress. Diagnostic lab work ordered. Patient will be receiving IV fluids, antinausea medication, pain medication, and I will continue to monitor the patient.   2:06 PM Re: evaluation, patient states she feels much better. States her nausea and abdominal pain has improved with current treatment. She still complaining of the  dehydrated and request for more IV fluid. I will continue to  hydrate her. Patient has not vomited or had diarrhea in the ED.     Fayrene Helper, PA-C 06/10/11 1500

## 2011-06-10 NOTE — ED Notes (Signed)
Pt is here with abdominal pain, diarrhea, and vomiting at 0700 this am.  LMP was in March.  Pt reports she is not having sex.  Pt is crying and aggitated at triage.

## 2011-06-10 NOTE — Discharge Instructions (Signed)
Viral Gastroenteritis Viral gastroenteritis is also known as stomach flu. This condition affects the stomach and intestinal tract. It can cause sudden diarrhea and vomiting. The illness typically lasts 3 to 8 days. Most people develop an immune response that eventually gets rid of the virus. While this natural response develops, the virus can make you quite ill. CAUSES  Many different viruses can cause gastroenteritis, such as rotavirus or noroviruses. You can catch one of these viruses by consuming contaminated food or water. You may also catch a virus by sharing utensils or other personal items with an infected person or by touching a contaminated surface. SYMPTOMS  The most common symptoms are diarrhea and vomiting. These problems can cause a severe loss of body fluids (dehydration) and a body salt (electrolyte) imbalance. Other symptoms may include:  Fever.   Headache.   Fatigue.   Abdominal pain.  DIAGNOSIS  Your caregiver can usually diagnose viral gastroenteritis based on your symptoms and a physical exam. A stool sample may also be taken to test for the presence of viruses or other infections. TREATMENT  This illness typically goes away on its own. Treatments are aimed at rehydration. The most serious cases of viral gastroenteritis involve vomiting so severely that you are not able to keep fluids down. In these cases, fluids must be given through an intravenous line (IV). HOME CARE INSTRUCTIONS   Drink enough fluids to keep your urine clear or pale yellow. Drink small amounts of fluids frequently and increase the amounts as tolerated.   Ask your caregiver for specific rehydration instructions.   Avoid:   Foods high in sugar.   Alcohol.   Carbonated drinks.   Tobacco.   Juice.   Caffeine drinks.   Extremely hot or cold fluids.   Fatty, greasy foods.   Too much intake of anything at one time.   Dairy products until 24 to 48 hours after diarrhea stops.   You may  consume probiotics. Probiotics are active cultures of beneficial bacteria. They may lessen the amount and number of diarrheal stools in adults. Probiotics can be found in yogurt with active cultures and in supplements.   Wash your hands well to avoid spreading the virus.   Only take over-the-counter or prescription medicines for pain, discomfort, or fever as directed by your caregiver. Do not give aspirin to children. Antidiarrheal medicines are not recommended.   Ask your caregiver if you should continue to take your regular prescribed and over-the-counter medicines.   Keep all follow-up appointments as directed by your caregiver.  SEEK IMMEDIATE MEDICAL CARE IF:   You are unable to keep fluids down.   You do not urinate at least once every 6 to 8 hours.   You develop shortness of breath.   You notice blood in your stool or vomit. This may look like coffee grounds.   You have abdominal pain that increases or is concentrated in one small area (localized).   You have persistent vomiting or diarrhea.   You have a fever.   The patient is a child younger than 3 months, and he or she has a fever.   The patient is a child older than 3 months, and he or she has a fever and persistent symptoms.   The patient is a child older than 3 months, and he or she has a fever and symptoms suddenly get worse.   The patient is a baby, and he or she has no tears when crying.  MAKE SURE YOU:     Understand these instructions.   Will watch your condition.   Will get help right away if you are not doing well or get worse.  Document Released: 02/19/2005 Document Revised: 02/08/2011 Document Reviewed: 12/06/2010 [facility] Patient Information 2012 [place], [place].  Clear Liquid Diet The clear liquid dietconsists of foods that are liquid or will become liquid at room temperature.You should be able to see through the liquid and beverages. Examples of foods allowed on a clear liquid diet include fruit  juice, broth or bouillon, gelatin, or frozen ice pops. The purpose of this diet is to provide necessary fluid, electrolytes such as sodium and potassium, and energy to keep the body functioning during times when you are not able to consume a regular diet.A clear liquid diet should not be continued for long periods of time as it is not nutritionally adequate.  REASONS FOR USING A CLEAR LIQUID DIET  In sudden onset (acute) conditions for a patient before or after surgery.   As the first step in oral feeding.   For fluid and electrolyte replacement in diarrheal diseases.   As a diet before certain medical tests are performed.  ADEQUACY The clear liquid diet is adequate only in ascorbic acid, according to the Recommended Dietary Allowances of the [employer]. CHOOSING FOODS Breads and Starches  Allowed:  None are allowed.   Avoid: All are avoided.  Vegetables  Allowed:  Strained tomato or vegetable juice.   Avoid: Any others.  Fruit  Allowed:  Strained fruit juices and fruit drinks. Include 1 serving of citrus or vitamin C-enriched fruit juice daily.   Avoid: Any others.  Meat and Meat Substitutes  Allowed:  None are allowed.   Avoid: All are avoided.  Milk  Allowed:  None are allowed.   Avoid: All are avoided.  Soups and Combination Foods  Allowed:  Clear bouillon, broth, or strained broth-based soups.   Avoid: Any others.  Desserts and Sweets  Allowed:  Sugar, honey. High protein gelatin. Flavored gelatin, ices, or frozen ice pops that do not contain milk.   Avoid: Any others.  Fats and Oils  Allowed:  None are allowed.   Avoid: All are avoided.  Beverages  Allowed: Cereal beverages, coffee (regular or decaffeinated), tea, or soda at the discretion of your caregiver.   Avoid: Any others.  Condiments  Allowed:  Iodized salt.   Avoid: Any others, including pepper.  Supplements  Allowed:  Liquid nutrition beverages.   Avoid: Any others  that contain lactose or fiber.  SAMPLE MEAL PLAN Breakfast  4 oz (120 mL) strained orange juice.    to 1 cup (125 to 250 mL) gelatin (plain or fortified).   1 cup (250 mL) beverage (coffee or tea).   Sugar, if desired.  Midmorning Snack   cup (125 mL) gelatin (plain or fortified).  Lunch  1 cup (250 mL) broth or consomm.   4 oz (120 mL) strained grapefruit juice.    cup (125 mL) gelatin (plain or fortified).   1 cup (250 mL) beverage (coffee or tea).   Sugar, if desired.  Midafternoon Snack   cup (125 mL) fruit ice.    cup (125 mL) strained fruit juice.  Dinner  1 cup (250 mL) broth or consomm.    cup (125 mL) cranberry juice.    cup (125 mL) flavored gelatin (plain or fortified).   1 cup (250 mL) beverage (coffee or tea).   Sugar, if desired.  Evening Snack  4 oz (120 mL)  strained apple juice (vitamin C-fortified).    cup (125 mL) flavored gelatin (plain or fortified).  Document Released: 02/19/2005 Document Revised: 02/08/2011 Document Reviewed: 05/19/2010 [facility] Patient Information 2012 [place], [place].Viral Gastroenteritis Viral gastroenteritis is also known as stomach flu. This condition affects the stomach and intestinal tract. It can cause sudden diarrhea and vomiting. The illness typically lasts 3 to 8 days. Most people develop an immune response that eventually gets rid of the virus. While this natural response develops, the virus can make you quite ill. CAUSES  Many different viruses can cause gastroenteritis, such as rotavirus or noroviruses. You can catch one of these viruses by consuming contaminated food or water. You may also catch a virus by sharing utensils or other personal items with an infected person or by touching a contaminated surface. SYMPTOMS  The most common symptoms are diarrhea and vomiting. These problems can cause a severe loss of body fluids (dehydration) and a body salt (electrolyte) imbalance. Other symptoms may  include:  Fever.   Headache.   Fatigue.   Abdominal pain.  DIAGNOSIS  Your caregiver can usually diagnose viral gastroenteritis based on your symptoms and a physical exam. A stool sample may also be taken to test for the presence of viruses or other infections. TREATMENT  This illness typically goes away on its own. Treatments are aimed at rehydration. The most serious cases of viral gastroenteritis involve vomiting so severely that you are not able to keep fluids down. In these cases, fluids must be given through an intravenous line (IV). HOME CARE INSTRUCTIONS   Drink enough fluids to keep your urine clear or pale yellow. Drink small amounts of fluids frequently and increase the amounts as tolerated.   Ask your caregiver for specific rehydration instructions.   Avoid:   Foods high in sugar.   Alcohol.   Carbonated drinks.   Tobacco.   Juice.   Caffeine drinks.   Extremely hot or cold fluids.   Fatty, greasy foods.   Too much intake of anything at one time.   Dairy products until 24 to 48 hours after diarrhea stops.   You may consume probiotics. Probiotics are active cultures of beneficial bacteria. They may lessen the amount and number of diarrheal stools in adults. Probiotics can be found in yogurt with active cultures and in supplements.   Wash your hands well to avoid spreading the virus.   Only take over-the-counter or prescription medicines for pain, discomfort, or fever as directed by your caregiver. Do not give aspirin to children. Antidiarrheal medicines are not recommended.   Ask your caregiver if you should continue to take your regular prescribed and over-the-counter medicines.   Keep all follow-up appointments as directed by your caregiver.  SEEK IMMEDIATE MEDICAL CARE IF:   You are unable to keep fluids down.   You do not urinate at least once every 6 to 8 hours.   You develop shortness of breath.   You notice blood in your stool or vomit. This  may look like coffee grounds.   You have abdominal pain that increases or is concentrated in one small area (localized).   You have persistent vomiting or diarrhea.   You have a fever.   The patient is a child younger than 3 months, and he or she has a fever.   The patient is a child older than 3 months, and he or she has a fever and persistent symptoms.   The patient is a child older than 3 months, and  he or she has a fever and symptoms suddenly get worse.   The patient is a baby, and he or she has no tears when crying.  MAKE SURE YOU:   Understand these instructions.   Will watch your condition.   Will get help right away if you are not doing well or get worse.  Document Released: 02/19/2005 Document Revised: 02/08/2011 Document Reviewed: 12/06/2010 [facility] Patient Information 2012 [place], [place].

## 2011-07-25 DIAGNOSIS — Z8639 Personal history of other endocrine, nutritional and metabolic disease: Secondary | ICD-10-CM | POA: Insufficient documentation

## 2011-07-27 ENCOUNTER — Telehealth: Payer: Self-pay | Admitting: Obstetrics and Gynecology

## 2011-08-15 ENCOUNTER — Ambulatory Visit (INDEPENDENT_AMBULATORY_CARE_PROVIDER_SITE_OTHER): Payer: Medicaid Other | Admitting: Obstetrics and Gynecology

## 2011-08-15 ENCOUNTER — Encounter: Payer: Self-pay | Admitting: Obstetrics and Gynecology

## 2011-08-15 VITALS — BP 104/70 | Ht 61.0 in | Wt 174.0 lb

## 2011-08-15 DIAGNOSIS — N946 Dysmenorrhea, unspecified: Secondary | ICD-10-CM

## 2011-08-15 DIAGNOSIS — Z975 Presence of (intrauterine) contraceptive device: Secondary | ICD-10-CM

## 2011-08-15 DIAGNOSIS — Z124 Encounter for screening for malignant neoplasm of cervix: Secondary | ICD-10-CM

## 2011-08-16 DIAGNOSIS — Z975 Presence of (intrauterine) contraceptive device: Secondary | ICD-10-CM | POA: Insufficient documentation

## 2011-08-16 LAB — PAP IG W/ RFLX HPV ASCU

## 2011-08-16 NOTE — Progress Notes (Signed)
Pt here primarily to make sure IUD still in place properly. No h/o surgery for endometriosis and says Mirena controls her menorrhagia and Dysmenorrhea well.  Filed Vitals:   08/15/11 1140  BP: 104/70   ROS: noncontributory  Pelvic exam:  VULVA: normal appearing vulva with no masses, tenderness or lesions,  VAGINA: normal appearing vagina with normal color and discharge, no lesions, CERVIX: normal appearing cervix without discharge or lesions, string is visible, dark blood in vault UTERUS: uterus is normal size, shape, consistency and nontender,  ADNEXA: normal adnexa in size, nontender and no masses.  A/P Pt offered u/s for further reassurance.  She would like that.  Will schedule. Next available for u/s and f/u Pap overdue - done today

## 2011-08-29 ENCOUNTER — Telehealth: Payer: Self-pay | Admitting: Obstetrics and Gynecology

## 2011-08-29 NOTE — Telephone Encounter (Signed)
JACKIE/READ MSG FROM [facility]

## 2011-08-29 NOTE — Telephone Encounter (Signed)
Attempted to reach pt to let her know I faxed notes from 08/16/2011 [facility], per her request. Levin Erp

## 2011-08-29 NOTE — Telephone Encounter (Signed)
Christina Allen/follow up

## 2011-09-14 ENCOUNTER — Encounter: Payer: Medicaid Other | Admitting: Obstetrics and Gynecology

## 2011-09-14 ENCOUNTER — Other Ambulatory Visit: Payer: Medicaid Other

## 2011-09-21 ENCOUNTER — Telehealth: Payer: Self-pay

## 2011-10-01 ENCOUNTER — Encounter: Payer: Medicaid Other | Admitting: Obstetrics and Gynecology

## 2011-10-01 ENCOUNTER — Other Ambulatory Visit: Payer: Medicaid Other

## 2011-10-09 ENCOUNTER — Ambulatory Visit (INDEPENDENT_AMBULATORY_CARE_PROVIDER_SITE_OTHER): Payer: Medicaid Other | Admitting: Obstetrics and Gynecology

## 2011-10-09 ENCOUNTER — Encounter: Payer: Self-pay | Admitting: Obstetrics and Gynecology

## 2011-10-09 ENCOUNTER — Ambulatory Visit (INDEPENDENT_AMBULATORY_CARE_PROVIDER_SITE_OTHER): Payer: Medicaid Other

## 2011-10-09 VITALS — BP 106/72 | Ht 63.0 in | Wt 175.0 lb

## 2011-10-09 DIAGNOSIS — N946 Dysmenorrhea, unspecified: Secondary | ICD-10-CM

## 2011-10-09 DIAGNOSIS — N949 Unspecified condition associated with female genital organs and menstrual cycle: Secondary | ICD-10-CM

## 2011-10-09 DIAGNOSIS — Z30432 Encounter for removal of intrauterine contraceptive device: Secondary | ICD-10-CM

## 2011-10-09 MED ORDER — KETOROLAC TROMETHAMINE 60 MG/2ML IM SOLN: 60.0000 mg | Freq: Once | INTRAMUSCULAR | Status: AC

## 2011-10-09 MED ORDER — IBUPROFEN 600 MG PO TABS: 600.0000 mg | ORAL_TABLET | Freq: Four times a day (QID) | ORAL | Status: AC | PRN

## 2011-10-09 MED ORDER — KETOROLAC TROMETHAMINE 30 MG/ML IJ SOLN
30.0000 mg | Freq: Once | INTRAMUSCULAR | Status: AC
  Administered 2011-10-09: 30 mg via INTRAMUSCULAR

## 2011-10-09 MED ORDER — TRANEXAMIC ACID 650 MG PO TABS: 1300.0000 mg | ORAL_TABLET | Freq: Three times a day (TID) | ORAL | Status: DC

## 2011-10-09 NOTE — Addendum Note (Signed)
Addended by: Marla Roe A on: 10/09/2011 09:56 AM   Modules accepted: Orders

## 2011-10-09 NOTE — Progress Notes (Signed)
Ultrasound uterus 3.5 x 7.1 x 4.1 cm normal bilateral ovaries IUD is identified within the uterine cavity there is a question of the T. Portion extending into the lateral uterine walls and the fundal portion otherwise IUD is in correct position  Pt says she is having pain and thinks its the IUD and wants it out but scared of pain with removal.   Filed Vitals:   10/09/11 0853  BP: 106/72   IUD removed without difficulty  A/P Will try lysteda and motrin in meantime.  Other BC options discussed, pt will consider. RTO in for f/u meds I don't rec pt try another IUD secondary to tenderness toradol 30mg  IM now

## 2012-01-17 ENCOUNTER — Ambulatory Visit: Payer: Medicaid Other | Admitting: Family Medicine

## 2012-01-25 ENCOUNTER — Telehealth: Payer: Self-pay | Admitting: Nurse Practitioner

## 2012-01-25 ENCOUNTER — Encounter: Payer: Self-pay | Admitting: Family Medicine

## 2012-01-25 ENCOUNTER — Ambulatory Visit (INDEPENDENT_AMBULATORY_CARE_PROVIDER_SITE_OTHER): Payer: Self-pay | Admitting: Family Medicine

## 2012-01-25 VITALS — BP 120/83 | HR 83 | Temp 98.0°F | Ht 63.0 in | Wt 177.0 lb

## 2012-01-25 DIAGNOSIS — K589 Irritable bowel syndrome without diarrhea: Secondary | ICD-10-CM

## 2012-01-25 DIAGNOSIS — J309 Allergic rhinitis, unspecified: Secondary | ICD-10-CM

## 2012-01-25 DIAGNOSIS — K219 Gastro-esophageal reflux disease without esophagitis: Secondary | ICD-10-CM

## 2012-01-25 DIAGNOSIS — L100 Pemphigus vulgaris: Secondary | ICD-10-CM | POA: Insufficient documentation

## 2012-01-25 DIAGNOSIS — Z87891 Personal history of nicotine dependence: Secondary | ICD-10-CM

## 2012-01-25 DIAGNOSIS — Z0000 Encounter for general adult medical examination without abnormal findings: Secondary | ICD-10-CM

## 2012-01-25 MED ORDER — TRANEXAMIC ACID 650 MG PO TABS: 1300.0000 mg | ORAL_TABLET | Freq: Three times a day (TID) | ORAL | Status: DC

## 2012-01-25 MED ORDER — OMEPRAZOLE 20 MG PO CPDR: 20.0000 mg | DELAYED_RELEASE_CAPSULE | Freq: Every day | ORAL | Status: DC

## 2012-01-25 NOTE — Telephone Encounter (Signed)
Notified patient $15 at [facility]. Rx sent.

## 2012-01-25 NOTE — Telephone Encounter (Signed)
The meds that were prescribed today are too expensive so she would like something something cheaper or on a $4.00 plan.  She is willing to go to a different pharmacy if she has to.

## 2012-01-25 NOTE — Patient Instructions (Addendum)
Try the new reflux medication (omeprazole)  Follow-up at your convenience to talk about your irritable bowel syndrome Please let me know the name of the specialist you saw across the street (call and let me know). Thank you.   It was nice to meet you.

## 2012-01-25 NOTE — Assessment & Plan Note (Signed)
She was advised to make follow-up visit to discuss this further. She signed ROI today so we may get records from [facility] office; she will call and let us know who she has seen in the past.

## 2012-01-25 NOTE — Progress Notes (Signed)
  Subjective:    Patient ID: Christina Allen, female    DOB: 07-Apr-1975, 36 y.o.   MRN: [id number]  HPI New patient visit  # She reports severe acid reflux symptoms ever since she was 36 years old She has been on PPI for the past 3-4 years which have worked well for her. She has been out of medications since her Medicaid ran out She tries to eat a low acid, low citrus, bland diet ROS: denies throat pain, blood in sputum  # She has a history of significant menorrhagia and was followed by a specialist. An IUD was unsuccessful, and she has been prescribed Lysteda to take prn and this significantly helps her bleeding  # History of IBS alternating between constipation and diarrhea She had seen a specialist in the past. They wanted to try medication and other modalities before resorting to surgical options. She is not taking any medications currently. She reports history of anal fissures.   # Preventative She denies being sexually active She has a few children, oldest age 38  Review of Systems  Allergies, medication, past medical history, family and social history reviewed.      Objective:   Physical Exam GEN: NAD CV: RRR, normal S1/S2, no murmurs PULM: NI WOB; CTAB ABD: NABS, soft, very mild suprapubic tenderness EXT: no edema NEURO: no focal deficits PSYCH: pleasant, normally and socially appropriate to questions    Assessment & Plan:

## 2012-01-25 NOTE — Assessment & Plan Note (Signed)
She is up to date on Pap smear.  She does not want a flu shot at this time.

## 2012-01-25 NOTE — Assessment & Plan Note (Signed)
PLAN: Change PPI to cheaper omeprazole

## 2012-01-25 NOTE — Telephone Encounter (Signed)
Fwd. To Dr.Oh Willaim Bane

## 2012-12-04 ENCOUNTER — Encounter (HOSPITAL_COMMUNITY): Payer: Self-pay | Admitting: *Deleted

## 2012-12-04 ENCOUNTER — Emergency Department (INDEPENDENT_AMBULATORY_CARE_PROVIDER_SITE_OTHER)
Admission: EM | Admit: 2012-12-04 | Discharge: 2012-12-04 | Disposition: A | Discharge status: Home / Self Care | Payer: Self-pay | Source: Home / Self Care | Attending: Emergency Medicine | Admitting: Emergency Medicine

## 2012-12-04 DIAGNOSIS — S139XXA Sprain of joints and ligaments of unspecified parts of neck, initial encounter: Secondary | ICD-10-CM

## 2012-12-04 DIAGNOSIS — S161XXA Strain of muscle, fascia and tendon at neck level, initial encounter: Secondary | ICD-10-CM

## 2012-12-04 MED ORDER — ONDANSETRON 4 MG PO TBDP
8.0000 mg | ORAL_TABLET | Freq: Once | ORAL | Status: AC
  Administered 2012-12-04: 8 mg via ORAL

## 2012-12-04 MED ORDER — ONDANSETRON 4 MG PO TBDP
ORAL_TABLET | ORAL | Status: AC
  Filled 2012-12-04: qty 2

## 2012-12-04 MED ORDER — ONDANSETRON 8 MG PO TBDP: 8.0000 mg | ORAL_TABLET | Freq: Three times a day (TID) | ORAL | Status: DC | PRN

## 2012-12-04 MED ORDER — MELOXICAM 15 MG PO TABS: 15.0000 mg | ORAL_TABLET | Freq: Every day | ORAL | Status: DC

## 2012-12-04 MED ORDER — HYDROCODONE-ACETAMINOPHEN 5-325 MG PO TABS
ORAL_TABLET | ORAL | Status: AC
  Filled 2012-12-04: qty 2

## 2012-12-04 MED ORDER — HYDROCODONE-ACETAMINOPHEN 5-325 MG PO TABS
2.0000 | ORAL_TABLET | Freq: Once | ORAL | Status: AC
  Administered 2012-12-04: 2 via ORAL

## 2012-12-04 MED ORDER — HYDROCODONE-ACETAMINOPHEN 5-325 MG PO TABS: ORAL_TABLET | ORAL | Status: DC

## 2012-12-04 MED ORDER — METHOCARBAMOL 500 MG PO TABS: 500.0000 mg | ORAL_TABLET | Freq: Three times a day (TID) | ORAL | Status: DC
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
## 2013-01-03 ENCOUNTER — Emergency Department (INDEPENDENT_AMBULATORY_CARE_PROVIDER_SITE_OTHER)
Admission: EM | Admit: 2013-01-03 | Discharge: 2013-01-03 | Disposition: A | Discharge status: Home / Self Care | Payer: Self-pay | Source: Home / Self Care

## 2013-01-03 ENCOUNTER — Encounter (HOSPITAL_COMMUNITY): Payer: Self-pay | Admitting: Emergency Medicine

## 2013-01-03 DIAGNOSIS — L109 Pemphigus, unspecified: Secondary | ICD-10-CM

## 2013-01-03 DIAGNOSIS — L100 Pemphigus vulgaris: Secondary | ICD-10-CM

## 2013-01-03 DIAGNOSIS — J069 Acute upper respiratory infection, unspecified: Secondary | ICD-10-CM

## 2013-01-03 NOTE — ED Provider Notes (Signed)
CSN: [id number]     Arrival date & time 01/03/13  1809 History   First MD Initiated Contact with Patient 01/03/13 1828     No chief complaint on file.  (Consider location/radiation/quality/duration/timing/severity/associated sxs/prior Treatment) HPI Comments: Feeling sluggish for 2-3 days. Tattoo to lower back applied 7 d ago and there is a small area of swelling and discomfort.   Past Medical History  Diagnosis Date  . GERD (gastroesophageal reflux disease)   . H/O varicella   . Trichomonas   . Leukorrhea 04/08/2007    Physiologic  . Abnormal Pap smear 2004  . Yeast infection   . Bacterial infection   . Preterm labor    Past Surgical History  Procedure Laterality Date  . Other surgical history      tubal ligation and c-section  . Wisdom tooth extraction    . Cesarean section    . Dilation and curettage of uterus    . Tubal ligation     Family History  Problem Relation Age of Onset  . Hypertension Mother   . COPD Maternal Grandmother     Emphysema   History  Substance Use Topics  . Smoking status: Former Games developer  . Smokeless tobacco: Not on file  . Alcohol Use: No   OB History   Grav Para Term Preterm Abortions TAB SAB Ect Mult Living   3 3        3      Review of Systems  Constitutional: Positive for activity change and fatigue. Negative for fever, chills and appetite change.  HENT: Positive for congestion, postnasal drip, sinus pressure and sneezing. Negative for facial swelling and sore throat.   Eyes: Negative.   Respiratory: Negative.   Cardiovascular: Negative.   Gastrointestinal: Negative.   Genitourinary: Negative.   Musculoskeletal: Negative for neck pain and neck stiffness.  Skin: Positive for rash. Negative for pallor.  Neurological: Negative.     Allergies  Review of patient's allergies indicates no known allergies.  Home Medications   Current Outpatient Rx  Name  Route  Sig  Dispense  Refill  . HYDROcodone-acetaminophen (NORCO/VICODIN) 5-325  MG per tablet      1 to 2 tabs every 4 to 6 hours as needed for pain.   20 tablet   0   . meloxicam (MOBIC) 15 MG tablet   Oral   Take 1 tablet (15 mg total) by mouth daily.   15 tablet   0   . methocarbamol (ROBAXIN) 500 MG tablet   Oral   Take 1 tablet (500 mg total) by mouth 3 (three) times daily.   30 tablet   0   . omeprazole (PRILOSEC) 20 MG capsule   Oral   Take 1 capsule (20 mg total) by mouth daily.   30 capsule   11   . ondansetron (ZOFRAN ODT) 8 MG disintegrating tablet   Oral   Take 1 tablet (8 mg total) by mouth every 8 (eight) hours as needed for nausea.   20 tablet   0   . tranexamic acid (LYSTEDA) 650 MG TABS   Oral   Take 2 tablets (1,300 mg total) by mouth 3 (three) times daily. As needed for severe menstrual bleeding.   30 tablet   0    BP 131/96  Pulse 93  Temp(Src) 99.4 F (37.4 C) (Oral)  Resp 16  SpO2 96%  LMP 11/27/2012 Physical Exam  Nursing note and vitals reviewed. Constitutional: She is oriented to person, place, and time.  She appears well-developed and well-nourished. No distress.  HENT:  Right Ear: External ear normal.  Left Ear: External ear normal.  Mouth/Throat: Oropharynx is clear and moist. No oropharyngeal exudate.  Eyes: Conjunctivae and EOM are normal.  Neck: Normal range of motion. Neck supple.  Cardiovascular: Normal rate, regular rhythm and normal heart sounds.   Pulmonary/Chest: Effort normal and breath sounds normal. No respiratory distress.  Musculoskeletal: Normal range of motion. She exhibits no edema.  Lymphadenopathy:    She has no cervical adenopathy.  Neurological: She is alert and oriented to person, place, and time.  Skin: Skin is warm and dry.  Adjacent to one of the tattoo lines ln back there is a small crop of papules 4 x 5 mm. Mildly tender. No erythema, fluctuence, no lymphangitis, no open areas or drainage.   Psychiatric: She has a normal mood and affect.    ED Course  Procedures (including  critical care time) Labs Review Labs Reviewed - No data to display Imaging Review No results found.     MDM   1. URI (upper respiratory infection)   2. Pemphigus vulgaris    Has a hx of pemphigus vulgaris since age of 11y. The lesion on the back is consistent with previous lesions due to this.   URI instructions; fluids and Alka Seltzer Cold meds. Sx's may last 10 days or more Ibuprofen for fever or discomfort For signs of infection to back rechk promptly.   Hayden Rasmussen, NP 01/03/13 [phone]

## 2013-01-03 NOTE — ED Provider Notes (Signed)
Medical screening examination/treatment/procedure(s) were performed by non-physician practitioner and as supervising physician I was immediately available for consultation/collaboration.  Leslee Home, M.D.  Reuben Likes, MD 01/03/13 [phone]

## 2013-01-03 NOTE — ED Notes (Signed)
Pt is here with complaints of feeling sluggish; sneezing, sinus headache, ear ache x 2 dys. Also here for skin infection due to having tattoo on back x 6 dys.

## 2013-06-24 ENCOUNTER — Emergency Department (HOSPITAL_COMMUNITY)
Admission: EM | Admit: 2013-06-24 | Discharge: 2013-06-24 | Disposition: A | Discharge status: Home / Self Care | Payer: 59 | Attending: Emergency Medicine | Admitting: Emergency Medicine

## 2013-06-24 ENCOUNTER — Encounter (HOSPITAL_COMMUNITY): Payer: Self-pay | Admitting: Emergency Medicine

## 2013-06-24 DIAGNOSIS — Z79899 Other long term (current) drug therapy: Secondary | ICD-10-CM | POA: Insufficient documentation

## 2013-06-24 DIAGNOSIS — R110 Nausea: Secondary | ICD-10-CM | POA: Insufficient documentation

## 2013-06-24 DIAGNOSIS — Z791 Long term (current) use of non-steroidal anti-inflammatories (NSAID): Secondary | ICD-10-CM | POA: Insufficient documentation

## 2013-06-24 DIAGNOSIS — R1084 Generalized abdominal pain: Secondary | ICD-10-CM | POA: Insufficient documentation

## 2013-06-24 DIAGNOSIS — G8929 Other chronic pain: Secondary | ICD-10-CM | POA: Insufficient documentation

## 2013-06-24 DIAGNOSIS — Z3202 Encounter for pregnancy test, result negative: Secondary | ICD-10-CM | POA: Insufficient documentation

## 2013-06-24 DIAGNOSIS — Z9851 Tubal ligation status: Secondary | ICD-10-CM | POA: Insufficient documentation

## 2013-06-24 DIAGNOSIS — Z87891 Personal history of nicotine dependence: Secondary | ICD-10-CM | POA: Insufficient documentation

## 2013-06-24 DIAGNOSIS — Z8619 Personal history of other infectious and parasitic diseases: Secondary | ICD-10-CM | POA: Insufficient documentation

## 2013-06-24 DIAGNOSIS — R51 Headache: Secondary | ICD-10-CM | POA: Insufficient documentation

## 2013-06-24 DIAGNOSIS — R109 Unspecified abdominal pain: Secondary | ICD-10-CM

## 2013-06-24 DIAGNOSIS — Z8742 Personal history of other diseases of the female genital tract: Secondary | ICD-10-CM | POA: Insufficient documentation

## 2013-06-24 DIAGNOSIS — R519 Headache, unspecified: Secondary | ICD-10-CM

## 2013-06-24 DIAGNOSIS — R079 Chest pain, unspecified: Secondary | ICD-10-CM

## 2013-06-24 DIAGNOSIS — K219 Gastro-esophageal reflux disease without esophagitis: Secondary | ICD-10-CM | POA: Insufficient documentation

## 2013-06-24 DIAGNOSIS — R197 Diarrhea, unspecified: Secondary | ICD-10-CM | POA: Insufficient documentation

## 2013-06-24 LAB — CBC WITH DIFFERENTIAL/PLATELET
Basophils Absolute: 0 10*3/uL (ref 0.0–0.1)
Basophils Relative: 1 % (ref 0–1)
Eosinophils Absolute: 0 10*3/uL (ref 0.0–0.7)
Eosinophils Relative: 1 % (ref 0–5)
HCT: 35.2 % — ABNORMAL LOW (ref 36.0–46.0)
Hemoglobin: 12.3 g/dL (ref 12.0–15.0)
Lymphocytes Relative: 36 % (ref 12–46)
Lymphs Abs: 1.6 10*3/uL (ref 0.7–4.0)
MCH: 32.1 pg (ref 26.0–34.0)
MCHC: 34.9 g/dL (ref 30.0–36.0)
MCV: 91.9 fL (ref 78.0–100.0)
Monocytes Absolute: 0.3 10*3/uL (ref 0.1–1.0)
Monocytes Relative: 6 % (ref 3–12)
Neutro Abs: 2.5 10*3/uL (ref 1.7–7.7)
Neutrophils Relative %: 56 % (ref 43–77)
Platelets: 330 10*3/uL (ref 150–400)
RBC: 3.83 MIL/uL — ABNORMAL LOW (ref 3.87–5.11)
RDW: 12.3 % (ref 11.5–15.5)
WBC: 4.4 10*3/uL (ref 4.0–10.5)

## 2013-06-24 LAB — URINALYSIS, ROUTINE W REFLEX MICROSCOPIC
Bilirubin Urine: NEGATIVE
Glucose, UA: NEGATIVE mg/dL
Ketones, ur: NEGATIVE mg/dL
Leukocytes, UA: NEGATIVE
Nitrite: NEGATIVE
Protein, ur: NEGATIVE mg/dL
Specific Gravity, Urine: 1.028 (ref 1.005–1.030)
Urobilinogen, UA: 0.2 mg/dL (ref 0.0–1.0)
pH: 6 (ref 5.0–8.0)

## 2013-06-24 LAB — COMPREHENSIVE METABOLIC PANEL
ALT: 12 U/L (ref 0–35)
AST: 13 U/L (ref 0–37)
Albumin: 3.6 g/dL (ref 3.5–5.2)
Alkaline Phosphatase: 64 U/L (ref 39–117)
BUN: 12 mg/dL (ref 6–23)
CO2: 24 mEq/L (ref 19–32)
Calcium: 8.7 mg/dL (ref 8.4–10.5)
Chloride: 102 mEq/L (ref 96–112)
Creatinine, Ser: 0.95 mg/dL (ref 0.50–1.10)
GFR calc Af Amer: 89 mL/min — ABNORMAL LOW (ref >90)
GFR calc non Af Amer: 77 mL/min — ABNORMAL LOW (ref >90)
Glucose, Bld: 92 mg/dL (ref 70–99)
Potassium: 3.4 mEq/L — ABNORMAL LOW (ref 3.7–5.3)
Sodium: 136 mEq/L — ABNORMAL LOW (ref 137–147)
Total Bilirubin: 0.3 mg/dL (ref 0.3–1.2)
Total Protein: 6.9 g/dL (ref 6.0–8.3)

## 2013-06-24 LAB — PREGNANCY, URINE: Preg Test, Ur: NEGATIVE

## 2013-06-24 LAB — URINE MICROSCOPIC-ADD ON

## 2013-06-24 LAB — TROPONIN I: Troponin I: <0.3 ng/mL (ref <0.30)

## 2013-06-24 LAB — LIPASE, BLOOD: Lipase: 19 U/L (ref 11–59)

## 2013-06-24 MED ORDER — POTASSIUM CHLORIDE CRYS ER 20 MEQ PO TBCR
40.0000 meq | EXTENDED_RELEASE_TABLET | Freq: Once | ORAL | Status: AC
  Administered 2013-06-24: 40 meq via ORAL
  Filled 2013-06-24: qty 2

## 2013-06-24 MED ORDER — SODIUM CHLORIDE 0.9 % IV BOLUS (SEPSIS)
1000.0000 mL | INTRAVENOUS | Status: AC
  Administered 2013-06-24: 1000 mL via INTRAVENOUS

## 2013-06-24 MED ORDER — SODIUM CHLORIDE 0.9 % IV BOLUS (SEPSIS): 1000.0000 mL | INTRAVENOUS | Status: DC

## 2013-06-24 MED ORDER — HYDROMORPHONE HCL PF 1 MG/ML IJ SOLN
1.0000 mg | Freq: Once | INTRAMUSCULAR | Status: AC
  Administered 2013-06-24: 1 mg via INTRAVENOUS
  Filled 2013-06-24: qty 1

## 2013-06-24 MED ORDER — DIPHENHYDRAMINE HCL 50 MG/ML IJ SOLN: 25.0000 mg | Freq: Once | INTRAMUSCULAR | Status: DC

## 2013-06-24 MED ORDER — PROMETHAZINE HCL 25 MG PO TABS: 25.0000 mg | ORAL_TABLET | Freq: Four times a day (QID) | ORAL | Status: DC | PRN

## 2013-06-24 MED ORDER — KETOROLAC TROMETHAMINE 30 MG/ML IJ SOLN: 30.0000 mg | Freq: Once | INTRAMUSCULAR | Status: DC

## 2013-06-24 MED ORDER — METOCLOPRAMIDE HCL 5 MG/ML IJ SOLN: 10.0000 mg | Freq: Once | INTRAMUSCULAR | Status: DC

## 2013-06-24 MED ORDER — ONDANSETRON HCL 4 MG/2ML IJ SOLN
4.0000 mg | Freq: Once | INTRAMUSCULAR | Status: AC
  Administered 2013-06-24: 4 mg via INTRAVENOUS
  Filled 2013-06-24: qty 2

## 2013-06-24 MED ORDER — HYDROCODONE-ACETAMINOPHEN 5-325 MG PO TABS: 1.0000 | ORAL_TABLET | Freq: Four times a day (QID) | ORAL | Status: DC | PRN

## 2013-06-24 MED ORDER — PROMETHAZINE HCL 25 MG/ML IJ SOLN
25.0000 mg | Freq: Once | INTRAMUSCULAR | Status: AC
  Administered 2013-06-24: 25 mg via INTRAVENOUS
  Filled 2013-06-24: qty 1

## 2013-06-24 NOTE — ED Notes (Addendum)
We had to do multiple ekgs due to interpretation not printing on the first one

## 2013-06-24 NOTE — ED Provider Notes (Signed)
Medical screening examination/treatment/procedure(s) were performed by non-physician practitioner and as supervising physician I was immediately available for consultation/collaboration.   EKG Interpretation   Date/Time:  Wednesday June 24 2013 12:04:31 EDT Ventricular Rate:  73 PR Interval:  147 QRS Duration: 99 QT Interval:  398 QTC Calculation: 439 R Axis:   -39 Text Interpretation:  Normal sinus rhythm No old tracing to compare  Confirmed by [facility]  MD, Analeese Andreatta [phone]) on 06/24/2013 1:23:16 PM       Richarda Blade, MD 06/24/13 1730

## 2013-06-24 NOTE — ED Notes (Signed)
Pt c/o chronic, intermittent, generalized abdominal pain, nausea, and sharp, intermittent, central chest pain x "over 1 month," headaches x 2-3 weeks, and diarrhea starting this morning.  Pain score 10/10.  Pt reports being seen multiple times for abdominal pain.

## 2013-06-24 NOTE — ED Provider Notes (Signed)
CSN: [id number]     Arrival date & time 06/24/13  1144 History   First MD Initiated Contact with Patient 06/24/13 1147     Chief Complaint  Patient presents with  . Abdominal Pain  . Chest Pain  . Headache     (Consider location/radiation/quality/duration/timing/severity/associated sxs/prior Treatment) HPI Pt is a 38yo female with hx of chronic abdominal pain and intermittent diarrhea and constipation presenting to ED c/o exacerbation of generalized abdominal pain that is constant, sharp in nature, 10/10, associated with nausea and "several" episodes of watery diarrhea w/o blood in stool. Diarrhea started this morning. Pt also c/o centralized, intermittent, sharp chest pain "over a month.  Pt has had intermittent headache that is generalized and aching, 8/10, for 2-3 weeks. Denies head trauma, change in vision, or balance. Similar to previous headaches. States she has been evaluated multiple times for abdominal pain, including by GI specialist in [place]. Pt moved to [place] area about 1 year ago, does not have PCP or GI specialist in the area. Denies hx of CAD or pancreatitis. Denies urinary or vaginal symptoms. Denies sick contacts or recent travel. Denies fever or vomiting. Admits to recreational use of alcohol. Denies cocaine use. Abdominal hx significant for tubal ligation an cesarean section.    Past Medical History  Diagnosis Date  . GERD (gastroesophageal reflux disease)   . H/O varicella   . Trichomonas   . Leukorrhea 04/08/2007    Physiologic  . Abnormal Pap smear 2004  . Yeast infection   . Bacterial infection   . Preterm labor    Past Surgical History  Procedure Laterality Date  . Other surgical history      tubal ligation and c-section  . Wisdom tooth extraction    . Cesarean section    . Dilation and curettage of uterus    . Tubal ligation     Family History  Problem Relation Age of Onset  . Hypertension Mother   . COPD Maternal Grandmother     Emphysema   History   Substance Use Topics  . Smoking status: Former Research scientist (life sciences)  . Smokeless tobacco: Not on file  . Alcohol Use: No   OB History   Grav Para Term Preterm Abortions TAB SAB Ect Mult Living   3 3        3      Review of Systems  Constitutional: Positive for appetite change. Negative for fever, chills, diaphoresis and fatigue.  Respiratory: Negative for cough and shortness of breath.   Cardiovascular: Positive for chest pain. Negative for palpitations and leg swelling.  Gastrointestinal: Positive for nausea, abdominal pain and diarrhea. Negative for vomiting, constipation, blood in stool and anal bleeding.  Genitourinary: Negative for dysuria, urgency, frequency, hematuria, flank pain, decreased urine volume, vaginal bleeding, vaginal discharge, vaginal pain, menstrual problem and pelvic pain.  All other systems reviewed and are negative.     Allergies  Review of patient's allergies indicates no known allergies.  Home Medications   Prior to Admission medications   Medication Sig Start Date End Date Taking? Authorizing Provider  HYDROcodone-acetaminophen (NORCO/VICODIN) 5-325 MG per tablet 1 to 2 tabs every 4 to 6 hours as needed for pain. 12/04/12   Harden Mo, MD  meloxicam (MOBIC) 15 MG tablet Take 1 tablet (15 mg total) by mouth daily. 12/04/12   Harden Mo, MD  methocarbamol (ROBAXIN) 500 MG tablet Take 1 tablet (500 mg total) by mouth 3 (three) times daily. 12/04/12   Harden Mo, MD  omeprazole (PRILOSEC) 20 MG capsule Take 1 capsule (20 mg total) by mouth daily. 01/25/12   Carolin Guernsey, MD  ondansetron (ZOFRAN ODT) 8 MG disintegrating tablet Take 1 tablet (8 mg total) by mouth every 8 (eight) hours as needed for nausea. 12/04/12   Harden Mo, MD  tranexamic acid (LYSTEDA) 650 MG TABS Take 2 tablets (1,300 mg total) by mouth 3 (three) times daily. As needed for severe menstrual bleeding. 01/25/12   Carolin Guernsey, MD   BP 101/69  Pulse 80  Temp(Src) 98.3 F (36.8 C)  (Oral)  Resp 16  SpO2 99%  LMP 06/03/2013 Physical Exam  Nursing note and vitals reviewed. Constitutional: She appears well-developed and well-nourished. No distress.  HENT:  Head: Normocephalic and atraumatic.  Eyes: Conjunctivae are normal. No scleral icterus.  Neck: Normal range of motion.  Cardiovascular: Normal rate, regular rhythm and normal heart sounds.   Pulmonary/Chest: Effort normal and breath sounds normal. No respiratory distress. She has no wheezes. She has no rales. She exhibits no tenderness.  Abdominal: Soft. Bowel sounds are normal. She exhibits no distension and no mass. There is tenderness ( mild, diffuse). There is no rebound and no guarding.  Soft, non-distended, mild diffuse tenderness. No CVAT   Musculoskeletal: Normal range of motion.  Neurological: She is alert.  Skin: Skin is warm and dry. She is not diaphoretic.    ED Course  Procedures (including critical care time) Labs Review Labs Reviewed  CBC WITH DIFFERENTIAL - Abnormal; Notable for the following:    RBC 3.83 (*)    HCT 35.2 (*)    All other components within normal limits  COMPREHENSIVE METABOLIC PANEL - Abnormal; Notable for the following:    Sodium 136 (*)    Potassium 3.4 (*)    GFR calc non Af Amer 77 (*)    GFR calc Af Amer 89 (*)    All other components within normal limits  URINALYSIS, ROUTINE W REFLEX MICROSCOPIC - Abnormal; Notable for the following:    APPearance CLOUDY (*)    Hgb urine dipstick TRACE (*)    All other components within normal limits  URINE MICROSCOPIC-ADD ON - Abnormal; Notable for the following:    Bacteria, UA FEW (*)    All other components within normal limits  LIPASE, BLOOD  TROPONIN I  PREGNANCY, URINE    Imaging Review No results found.   EKG Interpretation   Date/Time:  Wednesday June 24 2013 12:04:31 EDT Ventricular Rate:  73 PR Interval:  147 QRS Duration: 99 QT Interval:  398 QTC Calculation: 439 R Axis:   -39 Text Interpretation:   Normal sinus rhythm No old tracing to compare  Confirmed by [facility]  MD, ELLIOTT [phone]) on 06/24/2013 1:23:16 PM      MDM   Final diagnoses:  Chronic abdominal pain  Nausea  Diarrhea  Chest pain  Headache    Pt with hx of chronic abdominal pain c/o exacerbation of pain with nausea, diarrhea, chest pain and headache.  Doubt emergent process taking place at this time, including ACS, PE, surgical abdomen, SAH, CVA/TIA or intracranial bleed, however will order labs and EKG.  EKG: unremarkable CBC, Lipase, and Troponin: unremarkable. CMP: significant for mild hypokalemia, 3.4 UA: unremarkable.  Urine preg: negative.  Tx in ED: IV fluids, diluadid, zofran, potassium and phenergan.  Pt able to keep down several ounces of water. States pain and nausea have improved. States she feels comfortable being discharged home.  Advised to  f/u with [facility] for chronic abdominal symptoms.  Advised to also f/u with PCP. Return precautions provided. Pt verbalized understanding and agreement with tx plan.     Noland Fordyce, PA-C 06/24/13 1612

## 2013-06-24 NOTE — ED Provider Notes (Signed)
EKG Interpretation  Date/Time:  Wednesday June 24 2013 12:04:31 EDT Ventricular Rate:  73 PR Interval:  147 QRS Duration: 99 QT Interval:  398 QTC Calculation: 439 R Axis:   -39 Text Interpretation:  Normal sinus rhythm No old tracing to compare Confirmed by [facility]  MD, Ariana Cavenaugh [phone]) on 06/24/2013 1:23:16 PM      Medical screening examination/treatment/procedure(s) were performed by non-physician practitioner and as supervising physician I was immediately available for consultation/collaboration.   EKG Interpretation   Date/Time:  Wednesday June 24 2013 12:04:31 EDT Ventricular Rate:  73 PR Interval:  147 QRS Duration: 99 QT Interval:  398 QTC Calculation: 439 R Axis:   -39 Text Interpretation:  Normal sinus rhythm No old tracing to compare  Confirmed by [facility]  MD, Zaron Zwiefelhofer [phone]) on 06/24/2013 1:23:16 PM        Richarda Blade, MD 06/24/13 1730

## 2013-06-24 NOTE — Progress Notes (Signed)
[facility] provided pt with a list of primary care resources. Patient stated pending insurance through job.  °

## 2013-06-24 NOTE — ED Notes (Signed)
Pt given sprite to dink

## 2013-06-25 ENCOUNTER — Encounter (HOSPITAL_COMMUNITY): Payer: Self-pay | Admitting: Emergency Medicine

## 2013-06-25 ENCOUNTER — Emergency Department (HOSPITAL_COMMUNITY): Payer: 59

## 2013-06-25 ENCOUNTER — Emergency Department (HOSPITAL_COMMUNITY)
Admission: EM | Admit: 2013-06-25 | Discharge: 2013-06-25 | Disposition: A | Discharge status: Home / Self Care | Payer: 59 | Attending: Emergency Medicine | Admitting: Emergency Medicine

## 2013-06-25 DIAGNOSIS — R079 Chest pain, unspecified: Secondary | ICD-10-CM

## 2013-06-25 DIAGNOSIS — K219 Gastro-esophageal reflux disease without esophagitis: Secondary | ICD-10-CM | POA: Insufficient documentation

## 2013-06-25 DIAGNOSIS — R51 Headache: Secondary | ICD-10-CM | POA: Insufficient documentation

## 2013-06-25 DIAGNOSIS — Z8742 Personal history of other diseases of the female genital tract: Secondary | ICD-10-CM | POA: Insufficient documentation

## 2013-06-25 DIAGNOSIS — Z8619 Personal history of other infectious and parasitic diseases: Secondary | ICD-10-CM | POA: Insufficient documentation

## 2013-06-25 DIAGNOSIS — Z79899 Other long term (current) drug therapy: Secondary | ICD-10-CM | POA: Insufficient documentation

## 2013-06-25 DIAGNOSIS — Z87891 Personal history of nicotine dependence: Secondary | ICD-10-CM | POA: Insufficient documentation

## 2013-06-25 DIAGNOSIS — R110 Nausea: Secondary | ICD-10-CM | POA: Insufficient documentation

## 2013-06-25 DIAGNOSIS — R072 Precordial pain: Secondary | ICD-10-CM | POA: Insufficient documentation

## 2013-06-25 LAB — BASIC METABOLIC PANEL
BUN: 13 mg/dL (ref 6–23)
CO2: 19 mEq/L (ref 19–32)
Calcium: 8.8 mg/dL (ref 8.4–10.5)
Chloride: 107 mEq/L (ref 96–112)
Creatinine, Ser: 0.92 mg/dL (ref 0.50–1.10)
GFR calc Af Amer: >90 mL/min (ref >90)
GFR calc non Af Amer: 80 mL/min — ABNORMAL LOW (ref >90)
Glucose, Bld: 88 mg/dL (ref 70–99)
Potassium: 4 mEq/L (ref 3.7–5.3)
Sodium: 137 mEq/L (ref 137–147)

## 2013-06-25 LAB — CBC
HCT: 36.7 % (ref 36.0–46.0)
Hemoglobin: 12.8 g/dL (ref 12.0–15.0)
MCH: 32.9 pg (ref 26.0–34.0)
MCHC: 34.9 g/dL (ref 30.0–36.0)
MCV: 94.3 fL (ref 78.0–100.0)
Platelets: 305 10*3/uL (ref 150–400)
RBC: 3.89 MIL/uL (ref 3.87–5.11)
RDW: 12.6 % (ref 11.5–15.5)
WBC: 4.7 10*3/uL (ref 4.0–10.5)

## 2013-06-25 LAB — I-STAT TROPONIN, ED: Troponin i, poc: 0 ng/mL (ref 0.00–0.08)

## 2013-06-25 MED ORDER — IBUPROFEN 800 MG PO TABS
800.0000 mg | ORAL_TABLET | Freq: Once | ORAL | Status: AC
  Administered 2013-06-25: 800 mg via ORAL
  Filled 2013-06-25: qty 1

## 2013-06-25 MED ORDER — GI COCKTAIL ~~LOC~~
30.0000 mL | Freq: Once | ORAL | Status: AC
  Administered 2013-06-25: 30 mL via ORAL
  Filled 2013-06-25: qty 30

## 2013-06-25 NOTE — ED Notes (Signed)
Pt reports intermittent sharp central 7/10  CP x 3 weeks with N/V. Reports radiation to bilateral arms described as numbness/tinging. Neuro intact, grips equal. Denies numbness at this time. Seen last night [facility] for same. NAD. VSS.

## 2013-06-25 NOTE — Discharge Planning (Signed)
[id number] Christina Allen, Community Liaison  Spoke to patient about primary care resources and establishing care with a provider. Patient states she will be obtaining insurance through her employer in the upcoming weeks. Resource guide and my contact information was provided for any future questions or concerns.

## 2013-06-25 NOTE — Discharge Instructions (Signed)
May take excedrine migraine/tension headache as needed for recurrent headaches. Recommend eating bananas, leafy green vegetables, and drinking orange juice to help keep potassium up. Return to the ED for new or worsening symptoms.

## 2013-06-25 NOTE — ED Notes (Signed)
Pt discharged to home with family. NAD.  

## 2013-06-25 NOTE — ED Provider Notes (Signed)
CSN: [id number]     Arrival date & time 06/25/13  1148 History   First MD Initiated Contact with Patient 06/25/13 1207     Chief Complaint  Patient presents with  . Chest Pain  . Nausea     (Consider location/radiation/quality/duration/timing/severity/associated sxs/prior Treatment) Patient is a 38 y.o. female presenting with chest pain. The history is provided by the patient and medical records.  Chest Pain  [name] with history of GERD, presenting to the ED for intermittent chest pain over the past month. Patient states pain will occur on and off for several days, disappeared, then returned a few days later. Pain is always localized to the midsternal region, described as sharp and stabbing without radiation. Some nausea today, no vomiting.  No associated shortness of breath, palpitations, diaphoresis, dizziness, or weakness.  Patient is a former smoker. No personal cardiac history. + family hx of CAD.  No recent travel, LE edema, calf pain, prolonged immobilization, or use of exogenous estrogens.  No prior history of PE or DVT.  Patient also complains of a mild, generalized headache with associated photophobia intermittently over the past several weeks as well. States headache is worse when working on computer or watching television. She is now her glasses or contacts. She denies any visual disturbance, tinnitus, confusion, changes in speech, or difficulty ambulating.  Hx of migraines years ago.  No fevers, chills, or neck pain.  Past Medical History  Diagnosis Date  . GERD (gastroesophageal reflux disease)   . H/O varicella   . Trichomonas   . Leukorrhea 04/08/2007    Physiologic  . Abnormal Pap smear 2004  . Yeast infection   . Bacterial infection   . Preterm labor    Past Surgical History  Procedure Laterality Date  . Other surgical history      tubal ligation and c-section  . Wisdom tooth extraction    . Cesarean section    . Dilation and curettage of uterus     . Tubal ligation     Family History  Problem Relation Age of Onset  . Hypertension Mother   . COPD Maternal Grandmother     Emphysema   History  Substance Use Topics  . Smoking status: Former Research scientist (life sciences)  . Smokeless tobacco: Not on file  . Alcohol Use: No   OB History   Grav Para Term Preterm Abortions TAB SAB Ect Mult Living   3 3        3      Review of Systems  Cardiovascular: Positive for chest pain.  All other systems reviewed and are negative.     Allergies  Review of patient's allergies indicates no known allergies.  Home Medications   Prior to Admission medications   Medication Sig Start Date End Date Taking? Authorizing Provider  calcium carbonate (TUMS - DOSED IN MG ELEMENTAL CALCIUM) 500 MG chewable tablet Chew 1 tablet by mouth as needed for indigestion or heartburn.   Yes Historical Provider, MD  HYDROcodone-acetaminophen (NORCO/VICODIN) 5-325 MG per tablet Take 1-2 tablets by mouth every 6 (six) hours as needed for moderate pain or severe pain. 06/24/13  Yes Noland Fordyce, PA-C  ibuprofen (ADVIL,MOTRIN) 200 MG tablet Take 600 mg by mouth every 6 (six) hours as needed for mild pain.   Yes Historical Provider, MD  Multiple Vitamins-Minerals (MULTIVITAMIN WITH MINERALS) tablet Take 1 tablet by mouth daily as needed.   Yes Historical Provider, MD  omeprazole (PRILOSEC) 20 MG capsule Take 1 capsule (20  mg total) by mouth daily. 01/25/12  Yes Carolin Guernsey, MD  promethazine (PHENERGAN) 25 MG tablet Take 1 tablet (25 mg total) by mouth every 6 (six) hours as needed for nausea or vomiting. 06/24/13  Yes Noland Fordyce, PA-C   BP 105/76  Pulse 83  Temp(Src) 98.2 F (36.8 C) (Oral)  Resp 18  SpO2 100%  LMP 06/03/2013  Physical Exam  Nursing note and vitals reviewed. Constitutional: She is oriented to person, place, and time. She appears well-developed and well-nourished. No distress.  HENT:  Head: Normocephalic and atraumatic.  Mouth/Throat: Oropharynx is clear  and moist.  Eyes: Conjunctivae and EOM are normal. Pupils are equal, round, and reactive to light.  Neck: Normal range of motion and full passive range of motion without pain. Neck supple. No rigidity. No edema present.  No meningeal signs  Cardiovascular: Normal rate, regular rhythm and normal heart sounds.   Pulmonary/Chest: Effort normal and breath sounds normal. No respiratory distress. She has no wheezes.  Chest wall nontender  Abdominal: Soft. Bowel sounds are normal. There is no tenderness. There is no guarding.  Musculoskeletal: Normal range of motion. She exhibits no edema.  Neurological: She is alert and oriented to person, place, and time. She has normal strength. She displays no tremor. No cranial nerve deficit or sensory deficit. She displays no seizure activity. Gait normal.  AAOx3, answering questions and following appropriately; equal strength UE and LE bilaterally; CN grossly intact; moves all extremities appropriately without ataxia; no focal neuro deficits or facial asymmetry appreciated  Skin: Skin is warm and dry. She is not diaphoretic.  Psychiatric: She has a normal mood and affect.    ED Course  Procedures (including critical care time) Labs Review Labs Reviewed  BASIC METABOLIC PANEL - Abnormal; Notable for the following:    GFR calc non Af Amer 80 (*)    All other components within normal limits  CBC  I-STAT TROPOININ, ED    Imaging Review Dg Chest 2 View  06/25/2013   CLINICAL DATA:  Sternal chest pain for 2-3 weeks.  EXAM: CHEST  2 VIEW  COMPARISON:  None.  FINDINGS: The heart size and mediastinal contours are within normal limits. Both lungs are clear. The visualized skeletal structures are unremarkable.  IMPRESSION: Normal chest radiographs.   Electronically Signed   By: Lajean Manes M.D.   On: 06/25/2013 12:39     EKG Interpretation None      MDM   Final diagnoses:  Chest pain  Nausea   38 y.o. F with no cardiac RF presenting to the ED for  intermittent chest pain x 1 month.  Nursing note stated numbness and paresthesias however pt denies this to me.  Mild headache, currently afebrile without neck pain.  Neuro exam is intact without focal deficit.  Will obtain basic labs, EKG, CXR.  EKG sinus rhythm without ischemic change.  Trop negative.  Labs reassuring.  CXR clear.  Pt with vague complaints, story is not consistent from triage to time of my evaluation.  Pt was also seen at [facility] yesterday with negative work-up.  She has no known cardiac RF and at this time i have low suspicion for ACS, PE, dissection, or other acute cardiac event.  Heart score of 0.  Pt is PERC negative.  Neuro exam remains intact without deficit.  Doubt ICH, SAH, TIA, stroke, or meningitis.  Pt will be discharged home with referral to wellness clinic.  Recommended excedrine migrain/tesnsion headache as needed.  Discussed plan with pt, she acknowledged understanding and agreed with plan of care.  Larene Pickett, PA-C 06/25/13 1536

## 2013-06-27 NOTE — ED Provider Notes (Signed)
Medical screening examination/treatment/procedure(s) were performed by non-physician practitioner and as supervising physician I was immediately available for consultation/collaboration.    Dot Lanes, MD 06/27/13 1002

## 2013-07-14 ENCOUNTER — Encounter (HOSPITAL_COMMUNITY): Payer: Self-pay

## 2013-07-14 ENCOUNTER — Inpatient Hospital Stay (HOSPITAL_COMMUNITY)
Admission: AD | Admit: 2013-07-14 | Discharge: 2013-07-14 | Disposition: A | Discharge status: Home / Self Care | Payer: 59 | Source: Ambulatory Visit | Attending: Obstetrics & Gynecology | Admitting: Obstetrics & Gynecology

## 2013-07-14 DIAGNOSIS — K219 Gastro-esophageal reflux disease without esophagitis: Secondary | ICD-10-CM | POA: Insufficient documentation

## 2013-07-14 DIAGNOSIS — Z87891 Personal history of nicotine dependence: Secondary | ICD-10-CM | POA: Insufficient documentation

## 2013-07-14 DIAGNOSIS — N898 Other specified noninflammatory disorders of vagina: Secondary | ICD-10-CM | POA: Insufficient documentation

## 2013-07-14 HISTORY — DX: Endometriosis, unspecified: N80.9

## 2013-07-14 LAB — POCT PREGNANCY, URINE: Preg Test, Ur: NEGATIVE

## 2013-07-14 LAB — WET PREP, GENITAL
Clue Cells Wet Prep HPF POC: NONE SEEN
Trich, Wet Prep: NONE SEEN
Yeast Wet Prep HPF POC: NONE SEEN

## 2013-07-14 NOTE — MAU Provider Note (Signed)
Chief Complaint: Vaginal Discharge   First Provider Initiated Contact with Patient 07/14/13 2157     SUBJECTIVE HPI: Christina Allen is a 38 y.o. G3P3 female who presents with increasing vaginal discharge since her period ended a week ago. Denies itching or odor. Patient states she is in a mutually monogamous relationship. Describes discharge as clear/creamy and thick.   Past Medical History  Diagnosis Date  . GERD (gastroesophageal reflux disease)   . H/O varicella   . Trichomonas   . Abnormal Pap smear 2004  . Yeast infection   . Bacterial infection   . Preterm labor   . Endometriosis    OB History  Gravida Para Term Preterm AB SAB TAB Ectopic Multiple Living  3 3        3     # Outcome Date GA Lbr Len/2nd Weight Sex Delivery Anes PTL Lv  3 PAR     U    Y  2 PAR     U    Y  1 PAR     U    Y     Past Surgical History  Procedure Laterality Date  . Other surgical history      tubal ligation and c-section  . Wisdom tooth extraction    . Cesarean section    . Dilation and curettage of uterus    . Tubal ligation     History   Social History  . Marital Status: Single    Spouse Name: N/A    Number of Children: N/A  . Years of Education: N/A   Occupational History  . Not on file.   Social History Main Topics  . Smoking status: Former Research scientist (life sciences)  . Smokeless tobacco: Not on file  . Alcohol Use: No  . Drug Use: No  . Sexual Activity: No   Other Topics Concern  . Not on file   Social History Narrative   Lives with children    Quit tobacco 2005 (after 10 years)   Single   No current facility-administered medications on file prior to encounter.   Current Outpatient Prescriptions on File Prior to Encounter  Medication Sig Dispense Refill  . calcium carbonate (TUMS - DOSED IN MG ELEMENTAL CALCIUM) 500 MG chewable tablet Chew 1 tablet by mouth as needed for indigestion or heartburn.      Marland Kitchen ibuprofen (ADVIL,MOTRIN) 200 MG tablet Take 400 mg by mouth every 6 (six) hours as  needed for mild pain or moderate pain.       . Multiple Vitamins-Minerals (MULTIVITAMIN WITH MINERALS) tablet Take 1 tablet by mouth daily.       Marland Kitchen omeprazole (PRILOSEC) 20 MG capsule Take 1 capsule (20 mg total) by mouth daily.  30 capsule  11   No Known Allergies  ROS: Pertinent items in HPI  OBJECTIVE Blood pressure 119/91, pulse 61, temperature 98 F (36.7 C), temperature source Oral, resp. rate 16, height 5' 2.5" (1.588 m), weight 68.675 kg (151 lb 6.4 oz), last menstrual period 07/02/2013, SpO2 100.00%. GENERAL: Well-developed, well-nourished female in no acute distress.  HEENT: Normocephalic HEART: normal rate RESP: normal effort ABDOMEN: Soft, non-tender.  EXTREMITIES: Nontender, no edema NEURO: Alert and oriented SPECULUM EXAM: NEFG, moderate amount of muciod-white, odorless discharge, no blood noted, cervix clean BIMANUAL: cervix closed; uterus normal size, no adnexal tenderness or masses. No CMT.   LAB RESULTS Results for orders placed during the hospital encounter of 07/14/13 (from the past 24 hour(s))  WET PREP, GENITAL  Status: Abnormal   Collection Time    07/14/13  9:02 PM      Result Value Ref Range   Yeast Wet Prep HPF POC NONE SEEN  NONE SEEN   Trich, Wet Prep NONE SEEN  NONE SEEN   Clue Cells Wet Prep HPF POC NONE SEEN  NONE SEEN   WBC, Wet Prep HPF POC FEW (*) NONE SEEN  POCT PREGNANCY, URINE     Status: None   Collection Time    07/14/13 11:00 PM      Result Value Ref Range   Preg Test, Ur NEGATIVE  NEGATIVE    IMAGING NA  MAU COURSE  ASSESSMENT 1. Clear vaginal discharge-spinnbarkeit     PLAN Discharge home in stable condition. GC/Ct cultures pending.      Follow-up Information   Follow up with Gynecologist. (As needed for routine care)       Follow up with [place]. (As needed in emergencies)    Contact information:   [id number] [place]   [id number] [phone]    List of Gyns given.   Medication List         calcium carbonate 500 MG chewable tablet  Commonly known as:  TUMS - dosed in mg elemental calcium  Chew 1 tablet by mouth as needed for indigestion or heartburn.     ibuprofen 200 MG tablet  Commonly known as:  ADVIL,MOTRIN  Take 400 mg by mouth every 6 (six) hours as needed for mild pain or moderate pain.     loratadine 10 MG tablet  Commonly known as:  CLARITIN  Take 10 mg by mouth daily.     multivitamin with minerals tablet  Take 1 tablet by mouth daily.     omeprazole 20 MG capsule  Commonly known as:  PRILOSEC  Take 1 capsule (20 mg total) by mouth daily.       [place], [place] 07/14/2013  10:58 PM

## 2013-07-14 NOTE — MAU Note (Signed)
Patient states she had a vaginal discharge after her period ended. Denies odor, color, bleeding or pain.

## 2013-07-14 NOTE — Discharge Instructions (Signed)
Your discharge appears to be related to ovulation. Most women ovulate (release an egg) between 11 and 14 days after her last period started. Some women have an increased, thick, clear discharge at this time. This is a normal finding and usually decreases after 3-4 days.   Gonorrhea and Chlamydia cultures are pending. We will call you in 3 days if there are positive results.

## 2013-07-15 LAB — GC/CHLAMYDIA PROBE AMP
CT Probe RNA: NEGATIVE
GC Probe RNA: NEGATIVE

## 2013-07-16 NOTE — MAU Provider Note (Signed)

## 2013-09-09 ENCOUNTER — Emergency Department (HOSPITAL_COMMUNITY)
Admission: EM | Admit: 2013-09-09 | Discharge: 2013-09-09 | Disposition: A | Discharge status: Home / Self Care | Payer: Medicaid Other | Attending: Emergency Medicine | Admitting: Emergency Medicine

## 2013-09-09 ENCOUNTER — Emergency Department (HOSPITAL_COMMUNITY): Payer: Medicaid Other

## 2013-09-09 ENCOUNTER — Encounter (HOSPITAL_COMMUNITY): Payer: Self-pay | Admitting: Emergency Medicine

## 2013-09-09 DIAGNOSIS — IMO0002 Reserved for concepts with insufficient information to code with codable children: Secondary | ICD-10-CM | POA: Insufficient documentation

## 2013-09-09 DIAGNOSIS — Z8742 Personal history of other diseases of the female genital tract: Secondary | ICD-10-CM | POA: Insufficient documentation

## 2013-09-09 DIAGNOSIS — R079 Chest pain, unspecified: Secondary | ICD-10-CM | POA: Insufficient documentation

## 2013-09-09 DIAGNOSIS — Z8619 Personal history of other infectious and parasitic diseases: Secondary | ICD-10-CM | POA: Diagnosis not present

## 2013-09-09 DIAGNOSIS — Z79899 Other long term (current) drug therapy: Secondary | ICD-10-CM | POA: Diagnosis not present

## 2013-09-09 DIAGNOSIS — R519 Headache, unspecified: Secondary | ICD-10-CM

## 2013-09-09 DIAGNOSIS — Z87891 Personal history of nicotine dependence: Secondary | ICD-10-CM | POA: Diagnosis not present

## 2013-09-09 DIAGNOSIS — J069 Acute upper respiratory infection, unspecified: Secondary | ICD-10-CM | POA: Insufficient documentation

## 2013-09-09 DIAGNOSIS — H9209 Otalgia, unspecified ear: Secondary | ICD-10-CM | POA: Insufficient documentation

## 2013-09-09 DIAGNOSIS — R51 Headache: Secondary | ICD-10-CM

## 2013-09-09 DIAGNOSIS — K219 Gastro-esophageal reflux disease without esophagitis: Secondary | ICD-10-CM | POA: Diagnosis not present

## 2013-09-09 LAB — CBC
HCT: 36.5 % (ref 36.0–46.0)
Hemoglobin: 12.6 g/dL (ref 12.0–15.0)
MCH: 32.1 pg (ref 26.0–34.0)
MCHC: 34.5 g/dL (ref 30.0–36.0)
MCV: 92.9 fL (ref 78.0–100.0)
Platelets: 335 10*3/uL (ref 150–400)
RBC: 3.93 MIL/uL (ref 3.87–5.11)
RDW: 12.2 % (ref 11.5–15.5)
WBC: 4.3 10*3/uL (ref 4.0–10.5)

## 2013-09-09 LAB — BASIC METABOLIC PANEL
Anion gap: 15 (ref 5–15)
BUN: 10 mg/dL (ref 6–23)
CO2: 20 mEq/L (ref 19–32)
Calcium: 9.1 mg/dL (ref 8.4–10.5)
Chloride: 102 mEq/L (ref 96–112)
Creatinine, Ser: 0.86 mg/dL (ref 0.50–1.10)
GFR calc Af Amer: >90 mL/min (ref >90)
GFR calc non Af Amer: 86 mL/min — ABNORMAL LOW (ref >90)
Glucose, Bld: 102 mg/dL — ABNORMAL HIGH (ref 70–99)
Potassium: 3.9 mEq/L (ref 3.7–5.3)
Sodium: 137 mEq/L (ref 137–147)

## 2013-09-09 LAB — I-STAT TROPONIN, ED: Troponin i, poc: 0 ng/mL (ref 0.00–0.08)

## 2013-09-09 LAB — PRO B NATRIURETIC PEPTIDE: Pro B Natriuretic peptide (BNP): 14.2 pg/mL (ref 0–125)

## 2013-09-09 MED ORDER — FLUTICASONE PROPIONATE 50 MCG/ACT NA SUSP: 2.0000 | Freq: Every day | NASAL | Status: DC

## 2013-09-09 MED ORDER — ASPIRIN-ACETAMINOPHEN-CAFFEINE 250-250-65 MG PO TABS
2.0000 | ORAL_TABLET | Freq: Once | ORAL | Status: AC
  Administered 2013-09-09: 2 via ORAL
  Filled 2013-09-09: qty 2

## 2013-09-09 MED ORDER — GUAIFENESIN 100 MG/5ML PO LIQD: 100.0000 mg | ORAL | Status: DC | PRN

## 2013-09-09 NOTE — Discharge Instructions (Signed)

## 2013-09-09 NOTE — ED Notes (Signed)
Return from xray

## 2013-09-09 NOTE — ED Notes (Signed)
Patient transported to X-ray 

## 2013-09-09 NOTE — ED Notes (Signed)
Pt reports recently had a pipe burst in her apartment, thinks it had black mold in it. Today onset of chest heaviness, generalized body pain, sore throat, chills, cough, headache. No acute distress noted at triage, ekg done, airway intact.

## 2013-09-09 NOTE — ED Provider Notes (Signed)
CSN: [id number]     Arrival date & time 09/09/13  1455 History   First MD Initiated Contact with Patient 09/09/13 1534     Chief Complaint  Patient presents with  . Generalized Body Aches  . Chills  . Chest Pain     (Consider location/radiation/quality/duration/timing/severity/associated sxs/prior Treatment) HPI Pt is a 38yo female with hx of GERD, c/o intermittent chest pain and headaches x2-3 weeks, with onset of chest heaviness, generalized body aches, sore throat, chills, and cough that started 2 days ago, however, triage note states symptoms started today.  Pt states she has black mold in her apartment due to a burst pipe and states she now has to move. Pt states she told her mother about her symptoms and was advised to be evaluated by her mother in the ED.  Pt states her younger children have had "cold like" symptoms with cough and congestion last week.  Pt also c/o nausea and vomiting yesterday but none today.  States she has tried acetaminophen and ibuprofen yesterday but none today.  Per medical records, pt has been in ED several other times for CP and abdominal pain. Pt states she has stomach issues due to indigestion.  Pt states she does have a PCP but has not f/u with her since onset of symptoms.    Past Medical History  Diagnosis Date  . GERD (gastroesophageal reflux disease)   . H/O varicella   . Trichomonas   . Leukorrhea 04/08/2007    Physiologic  . Abnormal Pap smear 2004  . Yeast infection   . Bacterial infection   . Preterm labor   . Endometriosis    Past Surgical History  Procedure Laterality Date  . Other surgical history      tubal ligation and c-section  . Wisdom tooth extraction    . Cesarean section    . Dilation and curettage of uterus    . Tubal ligation     Family History  Problem Relation Age of Onset  . Hypertension Mother   . COPD Maternal Grandmother     Emphysema   History  Substance Use Topics  . Smoking status: Former Research scientist (life sciences)  . Smokeless  tobacco: Not on file  . Alcohol Use: No   OB History   Grav Para Term Preterm Abortions TAB SAB Ect Mult Living   3 3        3      Review of Systems  Constitutional: Positive for fever and chills. Negative for diaphoresis, appetite change, fatigue and unexpected weight change.  HENT: Positive for congestion, ear pain and sore throat. Negative for sneezing, trouble swallowing and voice change.   Respiratory: Positive for cough, chest tightness and shortness of breath.   Cardiovascular: Positive for chest pain. Negative for palpitations and leg swelling.  Gastrointestinal: Positive for nausea and vomiting. Negative for abdominal pain, diarrhea and constipation.  Neurological: Positive for headaches. Negative for dizziness and light-headedness.  All other systems reviewed and are negative.     Allergies  Review of patient's allergies indicates no known allergies.  Home Medications   Prior to Admission medications   Medication Sig Start Date End Date Taking? Authorizing Provider  calcium carbonate (TUMS - DOSED IN MG ELEMENTAL CALCIUM) 500 MG chewable tablet Chew 1 tablet by mouth as needed for indigestion or heartburn.    Historical Provider, MD  fluticasone (FLONASE) 50 MCG/ACT nasal spray Place 2 sprays into both nostrils daily. 09/09/13   Noland Fordyce, PA-C  guaiFENesin (ROBITUSSIN) 100  MG/5ML liquid Take 5-10 mLs (100-200 mg total) by mouth every 4 (four) hours as needed for cough. 09/09/13   Noland Fordyce, PA-C  ibuprofen (ADVIL,MOTRIN) 200 MG tablet Take 400 mg by mouth every 6 (six) hours as needed for mild pain or moderate pain.     Historical Provider, MD  loratadine (CLARITIN) 10 MG tablet Take 10 mg by mouth daily.    Historical Provider, MD  Multiple Vitamins-Minerals (MULTIVITAMIN WITH MINERALS) tablet Take 1 tablet by mouth daily.     Historical Provider, MD  omeprazole (PRILOSEC) 20 MG capsule Take 1 capsule (20 mg total) by mouth daily. 01/25/12   Carolin Guernsey, MD    BP 122/87  Pulse 82  Temp(Src) 98.1 F (36.7 C) (Oral)  Resp 18  Ht 5\' 3"  (1.6 m)  Wt 158 lb (71.668 kg)  BMI 28.00 kg/m2  SpO2 100%  LMP 08/26/2013 Physical Exam  Nursing note and vitals reviewed. Constitutional: She is oriented to person, place, and time. She appears well-developed and well-nourished. No distress.  Pt lying comfortably in darkened exam bed, NAD.  HENT:  Head: Normocephalic and atraumatic.  Right Ear: Hearing, tympanic membrane, external ear and ear canal normal.  Left Ear: Hearing, tympanic membrane, external ear and ear canal normal.  Nose: Mucosal edema present.  Mouth/Throat: Uvula is midline, oropharynx is clear and moist and mucous membranes are normal. No oropharyngeal exudate, posterior oropharyngeal edema, posterior oropharyngeal erythema or tonsillar abscesses.  Eyes: Conjunctivae and EOM are normal. Pupils are equal, round, and reactive to light. No scleral icterus.  Neck: Normal range of motion.  Cardiovascular: Normal rate, regular rhythm and normal heart sounds.   Regular rate and rhythm  Pulmonary/Chest: Effort normal and breath sounds normal. No respiratory distress. She has no wheezes. She has no rales. She exhibits no tenderness.  No respiratory distress, able to speak in full sentences w/o difficulty. Lungs: CTAB. No chest wall tenderness  Abdominal: Soft. Bowel sounds are normal. She exhibits no distension and no mass. There is no tenderness. There is no rebound and no guarding.  Soft, non-distended, non-tender. No CVAT  Musculoskeletal: Normal range of motion.  Neurological: She is alert and oriented to person, place, and time. She has normal strength. No cranial nerve deficit or sensory deficit. Coordination normal. GCS eye subscore is 4. GCS verbal subscore is 5. GCS motor subscore is 6.  Skin: Skin is warm and dry. She is not diaphoretic.    ED Course  Procedures (including critical care time) Labs Review Labs Reviewed  BASIC  METABOLIC PANEL - Abnormal; Notable for the following:    Glucose, Bld 102 (*)    GFR calc non Af Amer 86 (*)    All other components within normal limits  PRO B NATRIURETIC PEPTIDE  CBC  I-STAT TROPOININ, ED    Imaging Review Dg Chest 2 View  09/09/2013   CLINICAL DATA:  GENERALIZED BODY ACHES CHILLS CHEST PAIN  EXAM: CHEST  2 VIEW  COMPARISON:  Two-view chest 06/25/2013  FINDINGS: The heart size and mediastinal contours are within normal limits. Both lungs are clear. The visualized skeletal structures are unremarkable.  IMPRESSION: No active cardiopulmonary disease.   Electronically Signed   By: Margaree Mackintosh M.D.   On: 09/09/2013 16:00     EKG Interpretation   Date/Time:  Wednesday September 09 2013 15:02:49 EDT Ventricular Rate:  99 PR Interval:  134 QRS Duration: 82 QT Interval:  354 QTC Calculation: 454 R Axis:   -44  Text Interpretation:  Normal sinus rhythm Left axis deviation No  significant change since last tracing Confirmed by Ashok Cordia  MD, Lennette Bihari  ([id number]) on 09/09/2013 3:42:44 PM      MDM   Final diagnoses:  URI, acute  Headache, unspecified headache type  Chest pain, unspecified chest pain type    Pt is a 38yo female presenting to ED with multiple complaints including 3 week hx of intermittent chest pain and headache associated with new onset flu-like symptoms.  Pt believes it is due to black mold in her apartment. Reports younger children are also sick with cough and congestion.  Pt appears well, non-toxic. NAD. Afebrile, O2-100% on RA.  Pt vaguely describes CP and HA. States pain feels similar to previous.  Pt most concerned she has infection from mold.  Denies hx of asthma or COPD. CP not concerning for ACS or PE. HA-not concerned for [facility] or other intracranial bleed. Physical exam: unremarkable. No signs of infection. Normal neuro exam. Symptoms likely viral in nature.   Labs: unremarkable.  CXR: no active cardiopulmonary disease  EKG: no significant change since last  EKG  Previous medical records and nursing notes reviewed.   Will discharge pt home to f/u with PCP. Advised pt if she continued to have URI symptoms, she may need to be tested by PCP or allergies for fungis or other allergies related to mold in apartment. Discussed with pt that her lungs sounds clear and CXR at this time is normal, no evidence of pneumonia.  Rx: flonase, robitussin, ibuprofen.  Return precautions provided. Pt verbalized understanding and agreement with tx plan.   Noland Fordyce, PA-C 09/09/13 [phone]

## 2013-09-12 NOTE — ED Provider Notes (Signed)
Medical screening examination/treatment/procedure(s) were conducted as a shared visit with non-physician practitioner(s) and myself.  I personally evaluated the patient during the encounter.   EKG Interpretation   Date/Time:  Wednesday September 09 2013 15:02:49 EDT Ventricular Rate:  99 PR Interval:  134 QRS Duration: 82 QT Interval:  354 QTC Calculation: 454 R Axis:   -44 Text Interpretation:  Normal sinus rhythm Left axis deviation No  significant change since last tracing Confirmed by Batoul Limes  MD, Lennette Bihari  ([id number]) on 09/09/2013 3:42:44 PM      Pt x hx gerd, c/o cp x 2 weeks. No sob. Was concerned re mold in apt. No productive cough, no fevers. Chest cta. Labs. cxr.  Mirna Mires, MD 09/12/13 [phone]

## 2013-09-20 ENCOUNTER — Emergency Department (INDEPENDENT_AMBULATORY_CARE_PROVIDER_SITE_OTHER)
Admission: EM | Admit: 2013-09-20 | Discharge: 2013-09-20 | Disposition: A | Discharge status: Home / Self Care | Payer: 59 | Source: Home / Self Care | Attending: Family Medicine | Admitting: Family Medicine

## 2013-09-20 ENCOUNTER — Encounter (HOSPITAL_COMMUNITY): Payer: Self-pay | Admitting: Emergency Medicine

## 2013-09-20 DIAGNOSIS — J069 Acute upper respiratory infection, unspecified: Secondary | ICD-10-CM

## 2013-09-20 DIAGNOSIS — R109 Unspecified abdominal pain: Secondary | ICD-10-CM

## 2013-09-20 LAB — POCT RAPID STREP A: Streptococcus, Group A Screen (Direct): NEGATIVE

## 2013-09-20 LAB — POCT PREGNANCY, URINE: Preg Test, Ur: NEGATIVE

## 2013-09-20 LAB — POCT URINALYSIS DIP (DEVICE)
Bilirubin Urine: NEGATIVE
Glucose, UA: NEGATIVE mg/dL
Ketones, ur: NEGATIVE mg/dL
Leukocytes, UA: NEGATIVE
Nitrite: NEGATIVE
Protein, ur: NEGATIVE mg/dL
Specific Gravity, Urine: 1.015 (ref 1.005–1.030)
Urobilinogen, UA: 0.2 mg/dL (ref 0.0–1.0)
pH: 6 (ref 5.0–8.0)

## 2013-09-20 MED ORDER — IPRATROPIUM BROMIDE 0.06 % NA SOLN: 2.0000 | Freq: Four times a day (QID) | NASAL | Status: DC

## 2013-09-20 MED ORDER — CEPHALEXIN 500 MG PO CAPS: 500.0000 mg | ORAL_CAPSULE | Freq: Four times a day (QID) | ORAL | Status: DC

## 2013-09-20 MED ORDER — DICLOFENAC SODIUM 50 MG PO TBEC: 50.0000 mg | DELAYED_RELEASE_TABLET | Freq: Two times a day (BID) | ORAL | Status: DC | PRN

## 2013-09-20 MED ORDER — ANTIPYRINE-BENZOCAINE 5.4-1.4 % OT SOLN: 3.0000 [drp] | OTIC | Status: DC | PRN

## 2013-09-20 NOTE — ED Notes (Signed)
Patient states has had a sore throat, sinus pain And abd pain to her left side.  States her urine feels gritty when she voids Recently had to move due to mold in the walls

## 2013-09-20 NOTE — ED Provider Notes (Signed)
Christina Allen is a 38 y.o. female who presents to Urgent Care today for left flank pain. Patient has a one-week history of left flank pain. She notes some urinary frequency urgency and dysuria. She denies any vomiting or diarrhea fevers or chills. She additionally notes cough congestion facial pain and pressure. Visual symptoms have been present for one week as well. She has not tried any medications yet. Symptoms are moderate. No vaginal discharge.   Past Medical History  Diagnosis Date  . GERD (gastroesophageal reflux disease)   . H/O varicella   . Trichomonas   . Leukorrhea 04/08/2007    Physiologic  . Abnormal Pap smear 2004  . Yeast infection   . Bacterial infection   . Preterm labor   . Endometriosis    History  Substance Use Topics  . Smoking status: Former Research scientist (life sciences)  . Smokeless tobacco: Not on file  . Alcohol Use: No   ROS as above Medications: No current facility-administered medications for this encounter.   Current Outpatient Prescriptions  Medication Sig Dispense Refill  . antipyrine-benzocaine (AURALGAN) otic solution Place 3-4 drops into both ears every 2 (two) hours as needed for ear pain.  10 mL  0  . calcium carbonate (TUMS - DOSED IN MG ELEMENTAL CALCIUM) 500 MG chewable tablet Chew 1 tablet by mouth as needed for indigestion or heartburn.      . cephALEXin (KEFLEX) 500 MG capsule Take 1 capsule (500 mg total) by mouth 4 (four) times daily.  28 capsule  0  . diclofenac (VOLTAREN) 50 MG EC tablet Take 1 tablet (50 mg total) by mouth 2 (two) times daily as needed.  30 tablet  0  . fluticasone (FLONASE) 50 MCG/ACT nasal spray Place 2 sprays into both nostrils daily.  9.9 g  2  . guaiFENesin (ROBITUSSIN) 100 MG/5ML liquid Take 5-10 mLs (100-200 mg total) by mouth every 4 (four) hours as needed for cough.  60 mL  0  . ibuprofen (ADVIL,MOTRIN) 200 MG tablet Take 400 mg by mouth every 6 (six) hours as needed for mild pain or moderate pain.       Marland Kitchen ipratropium (ATROVENT) 0.06 %  nasal spray Place 2 sprays into both nostrils 4 (four) times daily.  15 mL  1  . loratadine (CLARITIN) 10 MG tablet Take 10 mg by mouth daily.      . Multiple Vitamins-Minerals (MULTIVITAMIN WITH MINERALS) tablet Take 1 tablet by mouth daily.       Marland Kitchen omeprazole (PRILOSEC) 20 MG capsule Take 1 capsule (20 mg total) by mouth daily.  30 capsule  11    Exam:  BP 113/88  Pulse 86  Temp(Src) 98.6 F (37 C) (Oral)  Resp 16  SpO2 100%  LMP 08/26/2013 Gen: Well NAD nontoxic appearing HEENT: EOMI,  MMM normal posterior pharynx and tympanic membranes. Nontender maxillary sinuses bilaterally. Lungs: Normal work of breathing. CTABL Heart: RRR no MRG Abd: NABS, Soft. Nondistended, Nontender no CV angle tenderness to percussion Exts: Brisk capillary refill, warm and well perfused.   Results for orders placed during the hospital encounter of 09/20/13 (from the past 24 hour(s))  POCT URINALYSIS DIP (DEVICE)     Status: Abnormal   Collection Time    09/20/13 11:46 AM      Result Value Ref Range   Glucose, UA NEGATIVE  NEGATIVE mg/dL   Bilirubin Urine NEGATIVE  NEGATIVE   Ketones, ur NEGATIVE  NEGATIVE mg/dL   Specific Gravity, Urine 1.015  1.005 - 1.030  Hgb urine dipstick SMALL (*) NEGATIVE   pH 6.0  5.0 - 8.0   Protein, ur NEGATIVE  NEGATIVE mg/dL   Urobilinogen, UA 0.2  0.0 - 1.0 mg/dL   Nitrite NEGATIVE  NEGATIVE   Leukocytes, UA NEGATIVE  NEGATIVE  POCT PREGNANCY, URINE     Status: None   Collection Time    09/20/13 11:56 AM      Result Value Ref Range   Preg Test, Ur NEGATIVE  NEGATIVE  POCT RAPID STREP A ([facility] ONLY)     Status: None   Collection Time    09/20/13 12:02 PM      Result Value Ref Range   Streptococcus, Group A Screen (Direct) NEGATIVE  NEGATIVE   No results found.  Assessment and Plan: 38 y.o. female with  1) flank pain: Unclear etiology. Nontender abdomen or flank. Possible kidney stone versus early urinary tract infection. Urine cultures pending. Empiric  treatment with Keflex and diclofenac. 2) URI: Patient has symptoms consistent with viral URI. She has facial pressure your pain and sore throat and discharge. Plan for treatment with Atrovent nasal spray and Auralgan ear drops. Diclofenac as needed for pain as well.  Discussed warning signs or symptoms. Please see discharge instructions. Patient expresses understanding.   This note was created using Systems analyst. Any transcription errors are unintended.    Gregor Hams, MD 09/20/13 [phone]

## 2013-09-20 NOTE — Discharge Instructions (Signed)
Thank you for coming in today. 1) Side Pain: Take the keflex 4x daily.  Use diclofenac twice daily for pain as needed.  Followup with your primary care Dr. Daphane Shepherd to the emergency room if you worsen If your belly pain worsens, or you have high fever, bad vomiting, blood in your stool or black tarry stool go to the Emergency Room.   2) Facial pain and congestion: The antibiotic should help Use the nasal spray and ear drops as needed for runny nose and ear pain Use diclofenac for pain as needed Followup with your primary care Dr. Call or go to the emergency room if you get worse, have trouble breathing, have chest pains, or palpitations.    Abdominal Pain, Women Abdominal (stomach, pelvic, or belly) pain can be caused by many things. It is important to tell your doctor:  The location of the pain.  Does it come and go or is it present all the time?  Are there things that start the pain (eating certain foods, exercise)?  Are there other symptoms associated with the pain (fever, nausea, vomiting, diarrhea)? All of this is helpful to know when trying to find the cause of the pain. CAUSES   Stomach: virus or bacteria infection, or ulcer.  Intestine: appendicitis (inflamed appendix), regional ileitis (Crohn's disease), ulcerative colitis (inflamed colon), irritable bowel syndrome, diverticulitis (inflamed diverticulum of the colon), or cancer of the stomach or intestine.  Gallbladder disease or stones in the gallbladder.  Kidney disease, kidney stones, or infection.  Pancreas infection or cancer.  Fibromyalgia (pain disorder).  Diseases of the female organs:  Uterus: fibroid (non-cancerous) tumors or infection.  Fallopian tubes: infection or tubal pregnancy.  Ovary: cysts or tumors.  Pelvic adhesions (scar tissue).  Endometriosis (uterus lining tissue growing in the pelvis and on the pelvic organs).  Pelvic congestion syndrome (female organs filling up with blood just before  the menstrual period).  Pain with the menstrual period.  Pain with ovulation (producing an egg).  Pain with an IUD (intrauterine device, birth control) in the uterus.  Cancer of the female organs.  Functional pain (pain not caused by a disease, may improve without treatment).  Psychological pain.  Depression. DIAGNOSIS  Your doctor will decide the seriousness of your pain by doing an examination.  Blood tests.  X-rays.  Ultrasound.  CT scan (computed tomography, special type of X-ray).  MRI (magnetic resonance imaging).  Cultures, for infection.  Barium enema (dye inserted in the large intestine, to better view it with X-rays).  Colonoscopy (looking in intestine with a lighted tube).  Laparoscopy (minor surgery, looking in abdomen with a lighted tube).  Major abdominal exploratory surgery (looking in abdomen with a large incision). TREATMENT  The treatment will depend on the cause of the pain.   Many cases can be observed and treated at home.  Over-the-counter medicines recommended by your caregiver.  Prescription medicine.  Antibiotics, for infection.  Birth control pills, for painful periods or for ovulation pain.  Hormone treatment, for endometriosis.  Nerve blocking injections.  Physical therapy.  Antidepressants.  Counseling with a psychologist or psychiatrist.  Minor or major surgery. HOME CARE INSTRUCTIONS   Do not take laxatives, unless directed by your caregiver.  Take over-the-counter pain medicine only if ordered by your caregiver. Do not take aspirin because it can cause an upset stomach or bleeding.  Try a clear liquid diet (broth or water) as ordered by your caregiver. Slowly move to a bland diet, as tolerated, if the  pain is related to the stomach or intestine.  Have a thermometer and take your temperature several times a day, and record it.  Bed rest and sleep, if it helps the pain.  Avoid sexual intercourse, if it causes  pain.  Avoid stressful situations.  Keep your follow-up appointments and tests, as your caregiver orders.  If the pain does not go away with medicine or surgery, you may try:  Acupuncture.  Relaxation exercises (yoga, meditation).  Group therapy.  Counseling. SEEK MEDICAL CARE IF:   You notice certain foods cause stomach pain.  Your home care treatment is not helping your pain.  You need stronger pain medicine.  You want your IUD removed.  You feel faint or lightheaded.  You develop nausea and vomiting.  You develop a rash.  You are having side effects or an allergy to your medicine. SEEK IMMEDIATE MEDICAL CARE IF:   Your pain does not go away or gets worse.  You have a fever.  Your pain is felt only in portions of the abdomen. The right side could possibly be appendicitis. The left lower portion of the abdomen could be colitis or diverticulitis.  You are passing blood in your stools (bright red or black tarry stools, with or without vomiting).  You have blood in your urine.  You develop chills, with or without a fever.  You pass out. MAKE SURE YOU:   Understand these instructions.  Will watch your condition.  Will get help right away if you are not doing well or get worse. Document Released: 12/17/2006 Document Revised: 05/14/2011 Document Reviewed: 01/06/2009 [facility] Patient Information 2015 [place], [place]. This information is not intended to replace advice given to you by your health care provider. Make sure you discuss any questions you have with your health care provider.  Upper Respiratory Infection, Adult An upper respiratory infection (URI) is also sometimes known as the common cold. The upper respiratory tract includes the nose, sinuses, throat, trachea, and bronchi. Bronchi are the airways leading to the lungs. Most people improve within 1 week, but symptoms can last up to 2 weeks. A residual cough may last even longer.  CAUSES Many different  viruses can infect the tissues lining the upper respiratory tract. The tissues become irritated and inflamed and often become very moist. Mucus production is also common. A cold is contagious. You can easily spread the virus to others by oral contact. This includes kissing, sharing a glass, coughing, or sneezing. Touching your mouth or nose and then touching a surface, which is then touched by another person, can also spread the virus. SYMPTOMS  Symptoms typically develop 1 to 3 days after you come in contact with a cold virus. Symptoms vary from person to person. They may include:  Runny nose.  Sneezing.  Nasal congestion.  Sinus irritation.  Sore throat.  Loss of voice (laryngitis).  Cough.  Fatigue.  Muscle aches.  Loss of appetite.  Headache.  Low-grade fever. DIAGNOSIS  You might diagnose your own cold based on familiar symptoms, since most people get a cold 2 to 3 times a year. Your caregiver can confirm this based on your exam. Most importantly, your caregiver can check that your symptoms are not due to another disease such as strep throat, sinusitis, pneumonia, asthma, or epiglottitis. Blood tests, throat tests, and X-rays are not necessary to diagnose a common cold, but they may sometimes be helpful in excluding other more serious diseases. Your caregiver will decide if any further tests are required.  RISKS AND COMPLICATIONS  You may be at risk for a more severe case of the common cold if you smoke cigarettes, have chronic heart disease (such as heart failure) or lung disease (such as asthma), or if you have a weakened immune system. The very young and very old are also at risk for more serious infections. Bacterial sinusitis, middle ear infections, and bacterial pneumonia can complicate the common cold. The common cold can worsen asthma and chronic obstructive pulmonary disease (COPD). Sometimes, these complications can require emergency medical care and may be  life-threatening. PREVENTION  The best way to protect against getting a cold is to practice good hygiene. Avoid oral or hand contact with people with cold symptoms. Wash your hands often if contact occurs. There is no clear evidence that vitamin C, vitamin E, echinacea, or exercise reduces the chance of developing a cold. However, it is always recommended to get plenty of rest and practice good nutrition. TREATMENT  Treatment is directed at relieving symptoms. There is no cure. Antibiotics are not effective, because the infection is caused by a virus, not by bacteria. Treatment may include:  Increased fluid intake. Sports drinks offer valuable electrolytes, sugars, and fluids.  Breathing heated mist or steam (vaporizer or shower).  Eating chicken soup or other clear broths, and maintaining good nutrition.  Getting plenty of rest.  Using gargles or lozenges for comfort.  Controlling fevers with ibuprofen or acetaminophen as directed by your caregiver.  Increasing usage of your inhaler if you have asthma. Zinc gel and zinc lozenges, taken in the first 24 hours of the common cold, can shorten the duration and lessen the severity of symptoms. Pain medicines may help with fever, muscle aches, and throat pain. A variety of non-prescription medicines are available to treat congestion and runny nose. Your caregiver can make recommendations and may suggest nasal or lung inhalers for other symptoms.  HOME CARE INSTRUCTIONS   Only take over-the-counter or prescription medicines for pain, discomfort, or fever as directed by your caregiver.  Use a warm mist humidifier or inhale steam from a shower to increase air moisture. This may keep secretions moist and make it easier to breathe.  Drink enough water and fluids to keep your urine clear or pale yellow.  Rest as needed.  Return to work when your temperature has returned to normal or as your caregiver advises. You may need to stay home longer to  avoid infecting others. You can also use a face mask and careful hand washing to prevent spread of the virus. SEEK MEDICAL CARE IF:   After the first few days, you feel you are getting worse rather than better.  You need your caregiver's advice about medicines to control symptoms.  You develop chills, worsening shortness of breath, or brown or red sputum. These may be signs of pneumonia.  You develop yellow or brown nasal discharge or pain in the face, especially when you bend forward. These may be signs of sinusitis.  You develop a fever, swollen neck glands, pain with swallowing, or white areas in the back of your throat. These may be signs of strep throat. SEEK IMMEDIATE MEDICAL CARE IF:   You have a fever.  You develop severe or persistent headache, ear pain, sinus pain, or chest pain.  You develop wheezing, a prolonged cough, cough up blood, or have a change in your usual mucus (if you have chronic lung disease).  You develop sore muscles or a stiff neck. Document Released: 08/15/2000 Document Revised:  05/14/2011 Document Reviewed: 06/23/2010 [employer] Patient Information 2015 [place], [place]. This information is not intended to replace advice given to you by your health care provider. Make sure you discuss any questions you have with your health care provider.

## 2013-09-21 LAB — URINE CULTURE
Colony Count: NO GROWTH
Culture: NO GROWTH
Special Requests: NORMAL

## 2013-09-22 LAB — CULTURE, GROUP A STREP

## 2013-09-28 ENCOUNTER — Encounter (HOSPITAL_COMMUNITY): Payer: Self-pay | Admitting: Emergency Medicine

## 2013-09-28 ENCOUNTER — Emergency Department (HOSPITAL_COMMUNITY)
Admission: EM | Admit: 2013-09-28 | Discharge: 2013-09-28 | Disposition: A | Discharge status: Home / Self Care | Payer: 59 | Attending: Emergency Medicine | Admitting: Emergency Medicine

## 2013-09-28 ENCOUNTER — Emergency Department (HOSPITAL_COMMUNITY): Payer: 59

## 2013-09-28 DIAGNOSIS — Z792 Long term (current) use of antibiotics: Secondary | ICD-10-CM | POA: Diagnosis not present

## 2013-09-28 DIAGNOSIS — Z8619 Personal history of other infectious and parasitic diseases: Secondary | ICD-10-CM | POA: Diagnosis not present

## 2013-09-28 DIAGNOSIS — K219 Gastro-esophageal reflux disease without esophagitis: Secondary | ICD-10-CM | POA: Diagnosis not present

## 2013-09-28 DIAGNOSIS — IMO0002 Reserved for concepts with insufficient information to code with codable children: Secondary | ICD-10-CM | POA: Insufficient documentation

## 2013-09-28 DIAGNOSIS — F411 Generalized anxiety disorder: Secondary | ICD-10-CM | POA: Insufficient documentation

## 2013-09-28 DIAGNOSIS — Z87891 Personal history of nicotine dependence: Secondary | ICD-10-CM | POA: Diagnosis not present

## 2013-09-28 DIAGNOSIS — Z79899 Other long term (current) drug therapy: Secondary | ICD-10-CM | POA: Diagnosis not present

## 2013-09-28 DIAGNOSIS — R079 Chest pain, unspecified: Secondary | ICD-10-CM

## 2013-09-28 DIAGNOSIS — Z8742 Personal history of other diseases of the female genital tract: Secondary | ICD-10-CM | POA: Diagnosis not present

## 2013-09-28 LAB — BASIC METABOLIC PANEL
Anion gap: 13 (ref 5–15)
BUN: 13 mg/dL (ref 6–23)
CO2: 25 mEq/L (ref 19–32)
Calcium: 8.7 mg/dL (ref 8.4–10.5)
Chloride: 100 mEq/L (ref 96–112)
Creatinine, Ser: 0.9 mg/dL (ref 0.50–1.10)
GFR calc Af Amer: >90 mL/min (ref >90)
GFR calc non Af Amer: 81 mL/min — ABNORMAL LOW (ref >90)
Glucose, Bld: 101 mg/dL — ABNORMAL HIGH (ref 70–99)
Potassium: 3.7 mEq/L (ref 3.7–5.3)
Sodium: 138 mEq/L (ref 137–147)

## 2013-09-28 LAB — CBC
HCT: 37.9 % (ref 36.0–46.0)
Hemoglobin: 13 g/dL (ref 12.0–15.0)
MCH: 32.3 pg (ref 26.0–34.0)
MCHC: 34.3 g/dL (ref 30.0–36.0)
MCV: 94.3 fL (ref 78.0–100.0)
Platelets: 427 10*3/uL — ABNORMAL HIGH (ref 150–400)
RBC: 4.02 MIL/uL (ref 3.87–5.11)
RDW: 12.4 % (ref 11.5–15.5)
WBC: 8.1 10*3/uL (ref 4.0–10.5)

## 2013-09-28 LAB — I-STAT TROPONIN, ED
Troponin i, poc: 0 ng/mL (ref 0.00–0.08)
Troponin i, poc: 0 ng/mL (ref 0.00–0.08)

## 2013-09-28 LAB — URINALYSIS, ROUTINE W REFLEX MICROSCOPIC
Bilirubin Urine: NEGATIVE
Glucose, UA: NEGATIVE mg/dL
Hgb urine dipstick: NEGATIVE
Ketones, ur: NEGATIVE mg/dL
Leukocytes, UA: NEGATIVE
Nitrite: NEGATIVE
Protein, ur: NEGATIVE mg/dL
Specific Gravity, Urine: 1.012 (ref 1.005–1.030)
Urobilinogen, UA: 0.2 mg/dL (ref 0.0–1.0)
pH: 8 (ref 5.0–8.0)

## 2013-09-28 LAB — PRO B NATRIURETIC PEPTIDE: Pro B Natriuretic peptide (BNP): 33.4 pg/mL (ref 0–125)

## 2013-09-28 MED ORDER — OXYCODONE-ACETAMINOPHEN 5-325 MG PO TABS
1.0000 | ORAL_TABLET | Freq: Once | ORAL | Status: AC
  Administered 2013-09-28: 1 via ORAL
  Filled 2013-09-28: qty 1

## 2013-09-28 MED ORDER — LORAZEPAM 2 MG/ML IJ SOLN
0.5000 mg | Freq: Once | INTRAMUSCULAR | Status: AC
  Administered 2013-09-28: 0.5 mg via INTRAVENOUS
  Filled 2013-09-28: qty 1

## 2013-09-28 MED ORDER — ONDANSETRON 4 MG PO TBDP
4.0000 mg | ORAL_TABLET | Freq: Once | ORAL | Status: AC
  Administered 2013-09-28: 4 mg via ORAL
  Filled 2013-09-28: qty 1

## 2013-09-28 MED ORDER — ACETAMINOPHEN 325 MG PO TABS
650.0000 mg | ORAL_TABLET | Freq: Once | ORAL | Status: AC
  Administered 2013-09-28: 650 mg via ORAL
  Filled 2013-09-28: qty 2

## 2013-09-28 NOTE — Discharge Instructions (Signed)
Your work-up today was normal. Follow-up with your primary care physician. Return to the ED for new or worsening symptoms.

## 2013-09-28 NOTE — ED Notes (Signed)
PT placed in gown and in bed. Pt monitored by pulse ox, bp cuff, and 12-lead. 

## 2013-09-28 NOTE — ED Notes (Signed)
Spoke with Tommi Rumps in x-ray regarding pt scan, stated she was on the list to be transported shortly.

## 2013-09-28 NOTE — ED Notes (Addendum)
Pt outside on phone, not available when paged, family with pager went to go get pt.

## 2013-09-28 NOTE — ED Notes (Signed)
Pt continues to state "I think my son is dead." Provided emotional support. States "I think I'm depressed, I need to check into a mental hospital." Denies suicidal/homocidal ideation at this time. EDP notified. See new orders

## 2013-09-28 NOTE — ED Notes (Signed)
Pt. reports mid chest pain for 2 weeks with SOB , occasional productive cough and nausea . Recent emotional stress due to son missing since Friday last week .

## 2013-09-28 NOTE — ED Notes (Signed)
Pt crying, able to answer questions.

## 2013-09-28 NOTE — ED Notes (Signed)
Pt up to toilet to give urine sample

## 2013-09-28 NOTE — ED Provider Notes (Signed)
CSN: [id number]     Arrival date & time 09/28/13  1923 History   First MD Initiated Contact with Patient 09/28/13 1949     Chief Complaint  Patient presents with  . Chest Pain     (Consider location/radiation/quality/duration/timing/severity/associated sxs/prior Treatment) The history is provided by the patient and medical records.   [name] with past medical history significant for GERD, endometriosis, presenting to the ED for 2 weeks of chest pain. Patient states pain has been intermittent, associated with shortness of breath. States pain occurs independent of exertional activity, described as sharp, and lasts a few seconds with each occurrence. Denies palpitations, dizziness, weakness, pain of neck or extremities, numbness, paresthesias.  No prior cardiac history.  Patient is a former smoker.  No recent travel, LE edema, calf pain, surgeries, or prolonged immobilization.  No exogenous estrogens.  No hx of DVT or PE.  She also notes a productive cough of white sputum and nausea. She denies vomiting, abdominal pain, or diarrhea. Has had some urinary frequency, but denies dysuria or vaginal discharge. No fever or chills.  She does admit to increased stress at home as her son has been missing for the past 3 days. States she is concerned that he is dead and no one has contacted her.  Mild tachycardia on arrival, VS otherwise stable.  Past Medical History  Diagnosis Date  . GERD (gastroesophageal reflux disease)   . H/O varicella   . Trichomonas   . Leukorrhea 04/08/2007    Physiologic  . Abnormal Pap smear 2004  . Yeast infection   . Bacterial infection   . Preterm labor   . Endometriosis    Past Surgical History  Procedure Laterality Date  . Other surgical history      tubal ligation and c-section  . Wisdom tooth extraction    . Cesarean section    . Dilation and curettage of uterus    . Tubal ligation     Family History  Problem Relation Age of Onset  .  Hypertension Mother   . COPD Maternal Grandmother     Emphysema   History  Substance Use Topics  . Smoking status: Former Research scientist (life sciences)  . Smokeless tobacco: Not on file  . Alcohol Use: No   OB History   Grav Para Term Preterm Abortions TAB SAB Ect Mult Living   3 3        3      Review of Systems  Cardiovascular: Positive for chest pain.  All other systems reviewed and are negative.     Allergies  Shellfish-derived products  Home Medications   Prior to Admission medications   Medication Sig Start Date End Date Taking? Authorizing Provider  antipyrine-benzocaine Toniann Fail) otic solution Place 1-2 drops into both ears every 2 (two) hours as needed for ear pain.   Yes Historical Provider, MD  calcium carbonate (TUMS - DOSED IN MG ELEMENTAL CALCIUM) 500 MG chewable tablet Chew 1-2 tablets by mouth as needed for indigestion or heartburn.    Yes Historical Provider, MD  cephALEXin (KEFLEX) 500 MG capsule Take 1 capsule (500 mg total) by mouth 4 (four) times daily. 09/20/13  Yes Gregor Hams, MD  diclofenac (VOLTAREN) 50 MG EC tablet Take 50 mg by mouth 2 (two) times daily as needed for moderate pain.   Yes Historical Provider, MD  fluticasone (FLONASE) 50 MCG/ACT nasal spray Place 1-2 sprays into both nostrils daily.   Yes Historical Provider, MD  ibuprofen (ADVIL,MOTRIN) 200  MG tablet Take 600 mg by mouth every 6 (six) hours as needed for mild pain or moderate pain.    Yes Historical Provider, MD  Multiple Vitamins-Minerals (MULTIVITAMIN WITH MINERALS) tablet Take 1 tablet by mouth daily.    Yes Historical Provider, MD  Pseudoephedrine HCl (SUDAFED 24 HOUR PO) Take 1 tablet by mouth daily.   Yes Historical Provider, MD  ranitidine (ZANTAC) 75 MG tablet Take 75 mg by mouth daily.   Yes Historical Provider, MD   BP 120/78  Pulse 87  Temp(Src) 98.2 F (36.8 C) (Oral)  Resp 21  SpO2 98%  LMP 09/10/2013  Physical Exam  Nursing note and vitals reviewed. Constitutional: She is oriented  to person, place, and time. She appears well-developed and well-nourished. No distress.  HENT:  Head: Normocephalic and atraumatic.  Mouth/Throat: Oropharynx is clear and moist.  Eyes: Conjunctivae and EOM are normal. Pupils are equal, round, and reactive to light.  Neck: Normal range of motion. Neck supple.  Cardiovascular: Normal rate, regular rhythm and normal heart sounds.   Pulmonary/Chest: Effort normal and breath sounds normal. No respiratory distress. She has no wheezes.  Abdominal: Soft. Bowel sounds are normal. There is no tenderness. There is no guarding.  Musculoskeletal: Normal range of motion.  Neurological: She is alert and oriented to person, place, and time.  Skin: Skin is warm and dry. She is not diaphoretic.  Psychiatric: Her mood appears anxious. She is not actively hallucinating. She expresses no homicidal and no suicidal ideation. She expresses no suicidal plans and no homicidal plans.    ED Course  Procedures (including critical care time) Labs Review Labs Reviewed  CBC - Abnormal; Notable for the following:    Platelets 427 (*)    All other components within normal limits  BASIC METABOLIC PANEL - Abnormal; Notable for the following:    Glucose, Bld 101 (*)    GFR calc non Af Amer 81 (*)    All other components within normal limits  PRO B NATRIURETIC PEPTIDE  URINALYSIS, ROUTINE W REFLEX MICROSCOPIC  I-STAT TROPOININ, ED  Randolm Idol, ED    Imaging Review Dg Chest 2 View  09/28/2013   CLINICAL DATA:  Shortness of breath and chest pain.  EXAM: CHEST  2 VIEW  COMPARISON:  PA and lateral chest 09/09/2013.  FINDINGS: Heart size and mediastinal contours are within normal limits. Both lungs are clear. Visualized skeletal structures are unremarkable.  IMPRESSION: Negative exam.   Electronically Signed   By: Inge Rise M.D.   On: 09/28/2013 21:22     EKG Interpretation None      MDM   Final diagnoses:  Chest pain, unspecified chest pain type    38 y.o. F with intermittent CP x 2 weeks.  On exam, patient is anxious appearing, tearful, and intermittently sobbing. EKG without acute ischemia.  Trop negative.  Labs reassuring.  CXR clear.  Patient has no prior cardiac history and no cardiac RF's. Feel she is low risk for ACS, PE, dissection, or other acute cardiac event however she states she would feel more comfortable with the "double test" that she has had previously.  Will obtain delta trop and will also send u/a as she complains of urinary frequency without other associated sx.  UA clear. Delta trop negative.  Suspect anxiety is playing a role in her sx given situation with her son.  Denies SI/HI/AVH, does not appear to be a threat to herself or others.  Feel patient is safe for discharge  home with PCP FU.  Discussed plan with patient, he/she acknowledged understanding and agreed with plan of care.  Return precautions given for new or worsening symptoms.  Larene Pickett, PA-C 09/29/13 [phone]

## 2013-09-29 NOTE — ED Provider Notes (Signed)
  Medical screening examination/treatment/procedure(s) were performed by non-physician practitioner and as supervising physician I was immediately available for consultation/collaboration.      Carmin Muskrat, MD 09/29/13 [phone]

## 2013-09-30 DIAGNOSIS — Z87891 Personal history of nicotine dependence: Secondary | ICD-10-CM | POA: Diagnosis not present

## 2013-09-30 DIAGNOSIS — Z8742 Personal history of other diseases of the female genital tract: Secondary | ICD-10-CM | POA: Insufficient documentation

## 2013-09-30 DIAGNOSIS — Z3202 Encounter for pregnancy test, result negative: Secondary | ICD-10-CM | POA: Insufficient documentation

## 2013-09-30 DIAGNOSIS — R1084 Generalized abdominal pain: Secondary | ICD-10-CM | POA: Diagnosis present

## 2013-09-30 DIAGNOSIS — Z9851 Tubal ligation status: Secondary | ICD-10-CM | POA: Insufficient documentation

## 2013-09-30 DIAGNOSIS — Z79899 Other long term (current) drug therapy: Secondary | ICD-10-CM | POA: Diagnosis not present

## 2013-09-30 DIAGNOSIS — Z90710 Acquired absence of both cervix and uterus: Secondary | ICD-10-CM | POA: Insufficient documentation

## 2013-09-30 DIAGNOSIS — Z8619 Personal history of other infectious and parasitic diseases: Secondary | ICD-10-CM | POA: Insufficient documentation

## 2013-09-30 DIAGNOSIS — K219 Gastro-esophageal reflux disease without esophagitis: Secondary | ICD-10-CM | POA: Insufficient documentation

## 2013-10-01 ENCOUNTER — Emergency Department (HOSPITAL_COMMUNITY)
Admission: EM | Admit: 2013-10-01 | Discharge: 2013-10-01 | Disposition: A | Discharge status: Home / Self Care | Payer: 59 | Attending: Emergency Medicine | Admitting: Emergency Medicine

## 2013-10-01 ENCOUNTER — Encounter (HOSPITAL_COMMUNITY): Payer: Self-pay | Admitting: Emergency Medicine

## 2013-10-01 ENCOUNTER — Emergency Department (HOSPITAL_COMMUNITY): Payer: 59

## 2013-10-01 DIAGNOSIS — R109 Unspecified abdominal pain: Secondary | ICD-10-CM

## 2013-10-01 LAB — COMPREHENSIVE METABOLIC PANEL
ALT: 18 U/L (ref 0–35)
AST: 13 U/L (ref 0–37)
Albumin: 3.6 g/dL (ref 3.5–5.2)
Alkaline Phosphatase: 69 U/L (ref 39–117)
Anion gap: 11 (ref 5–15)
BUN: 15 mg/dL (ref 6–23)
CO2: 24 mEq/L (ref 19–32)
Calcium: 8.4 mg/dL (ref 8.4–10.5)
Chloride: 104 mEq/L (ref 96–112)
Creatinine, Ser: 0.89 mg/dL (ref 0.50–1.10)
GFR calc Af Amer: >90 mL/min (ref >90)
GFR calc non Af Amer: 82 mL/min — ABNORMAL LOW (ref >90)
Glucose, Bld: 125 mg/dL — ABNORMAL HIGH (ref 70–99)
Potassium: 3.9 mEq/L (ref 3.7–5.3)
Sodium: 139 mEq/L (ref 137–147)
Total Bilirubin: 0.2 mg/dL — ABNORMAL LOW (ref 0.3–1.2)
Total Protein: 6.5 g/dL (ref 6.0–8.3)

## 2013-10-01 LAB — CBC WITH DIFFERENTIAL/PLATELET
Basophils Absolute: 0 10*3/uL (ref 0.0–0.1)
Basophils Relative: 0 % (ref 0–1)
Eosinophils Absolute: 0.1 10*3/uL (ref 0.0–0.7)
Eosinophils Relative: 1 % (ref 0–5)
HCT: 35.7 % — ABNORMAL LOW (ref 36.0–46.0)
Hemoglobin: 12.3 g/dL (ref 12.0–15.0)
Lymphocytes Relative: 51 % — ABNORMAL HIGH (ref 12–46)
Lymphs Abs: 3.5 10*3/uL (ref 0.7–4.0)
MCH: 32.9 pg (ref 26.0–34.0)
MCHC: 34.5 g/dL (ref 30.0–36.0)
MCV: 95.5 fL (ref 78.0–100.0)
Monocytes Absolute: 0.5 10*3/uL (ref 0.1–1.0)
Monocytes Relative: 7 % (ref 3–12)
Neutro Abs: 2.8 10*3/uL (ref 1.7–7.7)
Neutrophils Relative %: 41 % — ABNORMAL LOW (ref 43–77)
Platelets: 376 10*3/uL (ref 150–400)
RBC: 3.74 MIL/uL — ABNORMAL LOW (ref 3.87–5.11)
RDW: 12.6 % (ref 11.5–15.5)
WBC: 6.9 10*3/uL (ref 4.0–10.5)

## 2013-10-01 LAB — PREGNANCY, URINE: Preg Test, Ur: NEGATIVE

## 2013-10-01 LAB — LIPASE, BLOOD: Lipase: 25 U/L (ref 11–59)

## 2013-10-01 LAB — URINALYSIS, ROUTINE W REFLEX MICROSCOPIC
Bilirubin Urine: NEGATIVE
Glucose, UA: NEGATIVE mg/dL
Hgb urine dipstick: NEGATIVE
Ketones, ur: NEGATIVE mg/dL
Leukocytes, UA: NEGATIVE
Nitrite: NEGATIVE
Protein, ur: NEGATIVE mg/dL
Specific Gravity, Urine: 1.025 (ref 1.005–1.030)
Urobilinogen, UA: 1 mg/dL (ref 0.0–1.0)
pH: 7 (ref 5.0–8.0)

## 2013-10-01 LAB — RAPID STREP SCREEN (MED CTR MEBANE ONLY): Streptococcus, Group A Screen (Direct): NEGATIVE

## 2013-10-01 MED ORDER — IOHEXOL 300 MG/ML  SOLN
25.0000 mL | INTRAMUSCULAR | Status: AC
  Administered 2013-10-01: 25 mL via ORAL

## 2013-10-01 MED ORDER — IOHEXOL 300 MG/ML  SOLN
100.0000 mL | Freq: Once | INTRAMUSCULAR | Status: AC | PRN
  Administered 2013-10-01: 100 mL via INTRAVENOUS

## 2013-10-01 MED ORDER — MORPHINE SULFATE 4 MG/ML IJ SOLN
4.0000 mg | Freq: Once | INTRAMUSCULAR | Status: AC
  Administered 2013-10-01: 4 mg via INTRAVENOUS
  Filled 2013-10-01: qty 1

## 2013-10-01 MED ORDER — SODIUM CHLORIDE 0.9 % IV SOLN
1000.0000 mL | Freq: Once | INTRAVENOUS | Status: AC
  Administered 2013-10-01: 1000 mL via INTRAVENOUS

## 2013-10-01 MED ORDER — SODIUM CHLORIDE 0.9 % IV SOLN
1000.0000 mL | INTRAVENOUS | Status: DC
  Administered 2013-10-01: 1000 mL via INTRAVENOUS

## 2013-10-01 MED ORDER — TRAMADOL HCL 50 MG PO TABS: 50.0000 mg | ORAL_TABLET | Freq: Four times a day (QID) | ORAL | Status: DC | PRN

## 2013-10-01 NOTE — ED Notes (Signed)
Patient transported to CT 

## 2013-10-01 NOTE — ED Notes (Signed)
Pt alert and oriented with stable gait during ambulation to the waiting room with this RN.  Pt offered a wheelchair but refused.  This RN reviewed discharge instructions with the pt, pt verbalized understanding and the need for follow up care.

## 2013-10-01 NOTE — ED Provider Notes (Signed)
CSN: [id number]     Arrival date & time 09/30/13  2359 History   First MD Initiated Contact with Patient 10/01/13 [id number]     Chief Complaint  Patient presents with  . Abdominal Pain     (Consider location/radiation/quality/duration/timing/severity/associated sxs/prior Treatment) Patient is a 38 y.o. female presenting with abdominal pain. The history is provided by the patient.  Abdominal Pain She complains of generalized abdominal pain for the last 36 hours. She states it is like her colon is inflamed. Nothing makes pain better nothing makes it worse. She rates at 10/10. She denies nausea, vomiting, diarrhea. She denies fever or chills. She denies urinary urgency, frequency, tenesmus, dysuria. She also has multiple other complaints. She has been having some vague chest pain and states that her throat is burning and that she thinks there might be infection and diuretic lower molar and she is having pain in her ears. She has not taken any medication for any of this.  Past Medical History  Diagnosis Date  . GERD (gastroesophageal reflux disease)   . H/O varicella   . Trichomonas   . Leukorrhea 04/08/2007    Physiologic  . Abnormal Pap smear 2004  . Yeast infection   . Bacterial infection   . Preterm labor   . Endometriosis    Past Surgical History  Procedure Laterality Date  . Other surgical history      tubal ligation and c-section  . Wisdom tooth extraction    . Cesarean section    . Dilation and curettage of uterus    . Tubal ligation     Family History  Problem Relation Age of Onset  . Hypertension Mother   . COPD Maternal Grandmother     Emphysema   History  Substance Use Topics  . Smoking status: Former Research scientist (life sciences)  . Smokeless tobacco: Not on file  . Alcohol Use: No   OB History   Grav Para Term Preterm Abortions TAB SAB Ect Mult Living   3 3        3      Review of Systems  Gastrointestinal: Positive for abdominal pain.  All other systems reviewed and are  negative.     Allergies  Shellfish-derived products  Home Medications   Prior to Admission medications   Medication Sig Start Date End Date Taking? Authorizing Provider  antipyrine-benzocaine Toniann Fail) otic solution Place 1-2 drops into both ears every 2 (two) hours as needed for ear pain.   Yes Historical Provider, MD  calcium carbonate (TUMS - DOSED IN MG ELEMENTAL CALCIUM) 500 MG chewable tablet Chew 1-2 tablets by mouth as needed for indigestion or heartburn.    Yes Historical Provider, MD  ibuprofen (ADVIL,MOTRIN) 200 MG tablet Take 600 mg by mouth every 6 (six) hours as needed for mild pain or moderate pain.    Yes Historical Provider, MD  Multiple Vitamins-Minerals (MULTIVITAMIN WITH MINERALS) tablet Take 1 tablet by mouth daily.    Yes Historical Provider, MD  Pseudoephedrine HCl (SUDAFED 24 HOUR PO) Take 1 tablet by mouth daily.   Yes Historical Provider, MD  ranitidine (ZANTAC) 75 MG tablet Take 75 mg by mouth daily.   Yes Historical Provider, MD   BP 94/67  Pulse 84  Temp(Src) 98.7 F (37.1 C) (Oral)  Resp 18  Ht 5\' 1"  (1.549 m)  Wt 153 lb (69.4 kg)  BMI 28.92 kg/m2  SpO2 96%  LMP 09/10/2013 Physical Exam  Nursing note and vitals reviewed.  38 year old female, resting comfortably  and in no acute distress. Vital signs are normal. Oxygen saturation is 96%, which is normal. Head is normocephalic and atraumatic. PERRLA, EOMI. Oropharynx is clear. Dentition appears normal. There is no pooling of secretions and phonation is normal. Neck is nontender and supple without adenopathy or JVD. Back is nontender and there is no CVA tenderness. Lungs are clear without rales, wheezes, or rhonchi. Chest is nontender. Heart has regular rate and rhythm without murmur. Abdomen is soft, flat, with mild tenderness diffusely. There is no rebound or guarding. There are no masses or hepatosplenomegaly and peristalsis is slightly hypoactive. Extremities have no cyanosis or edema, full range  of motion is present. Skin is warm and dry without rash. Neurologic: Mental status is normal, cranial nerves are intact, there are no motor or sensory deficits.  ED Course  Procedures (including critical care time) Labs Review Results for orders placed during the hospital encounter of 10/01/13  CBC WITH DIFFERENTIAL      Result Value Ref Range   WBC 6.9  4.0 - 10.5 K/uL   RBC 3.74 (*) 3.87 - 5.11 MIL/uL   Hemoglobin 12.3  12.0 - 15.0 g/dL   HCT 35.7 (*) 36.0 - 46.0 %   MCV 95.5  78.0 - 100.0 fL   MCH 32.9  26.0 - 34.0 pg   MCHC 34.5  30.0 - 36.0 g/dL   RDW 12.6  11.5 - 15.5 %   Platelets 376  150 - 400 K/uL   Neutrophils Relative % 41 (*) 43 - 77 %   Neutro Abs 2.8  1.7 - 7.7 K/uL   Lymphocytes Relative 51 (*) 12 - 46 %   Lymphs Abs 3.5  0.7 - 4.0 K/uL   Monocytes Relative 7  3 - 12 %   Monocytes Absolute 0.5  0.1 - 1.0 K/uL   Eosinophils Relative 1  0 - 5 %   Eosinophils Absolute 0.1  0.0 - 0.7 K/uL   Basophils Relative 0  0 - 1 %   Basophils Absolute 0.0  0.0 - 0.1 K/uL  COMPREHENSIVE METABOLIC PANEL      Result Value Ref Range   Sodium 139  137 - 147 mEq/L   Potassium 3.9  3.7 - 5.3 mEq/L   Chloride 104  96 - 112 mEq/L   CO2 24  19 - 32 mEq/L   Glucose, Bld 125 (*) 70 - 99 mg/dL   BUN 15  6 - 23 mg/dL   Creatinine, Ser 0.89  0.50 - 1.10 mg/dL   Calcium 8.4  8.4 - 10.5 mg/dL   Total Protein 6.5  6.0 - 8.3 g/dL   Albumin 3.6  3.5 - 5.2 g/dL   AST 13  0 - 37 U/L   ALT 18  0 - 35 U/L   Alkaline Phosphatase 69  39 - 117 U/L   Total Bilirubin 0.2 (*) 0.3 - 1.2 mg/dL   GFR calc non Af Amer 82 (*) >90 mL/min   GFR calc Af Amer >90  >90 mL/min   Anion gap 11  5 - 15  LIPASE, BLOOD      Result Value Ref Range   Lipase 25  11 - 59 U/L  PREGNANCY, URINE      Result Value Ref Range   Preg Test, Ur NEGATIVE  NEGATIVE  URINALYSIS, ROUTINE W REFLEX MICROSCOPIC      Result Value Ref Range   Color, Urine YELLOW  YELLOW   APPearance CLOUDY (*) CLEAR   Specific Gravity,  Urine  1.025  1.005 - 1.030   pH 7.0  5.0 - 8.0   Glucose, UA NEGATIVE  NEGATIVE mg/dL   Hgb urine dipstick NEGATIVE  NEGATIVE   Bilirubin Urine NEGATIVE  NEGATIVE   Ketones, ur NEGATIVE  NEGATIVE mg/dL   Protein, ur NEGATIVE  NEGATIVE mg/dL   Urobilinogen, UA 1.0  0.0 - 1.0 mg/dL   Nitrite NEGATIVE  NEGATIVE   Leukocytes, UA NEGATIVE  NEGATIVE   Imaging Review Ct Abdomen Pelvis W Contrast  10/01/2013   CLINICAL DATA:  Generalized abdominal pain. Cramping onset yesterday.  EXAM: CT ABDOMEN AND PELVIS WITH CONTRAST  TECHNIQUE: Multidetector CT imaging of the abdomen and pelvis was performed using the standard protocol following bolus administration of intravenous contrast.  CONTRAST:  170mL OMNIPAQUE IOHEXOL 300 MG/ML  SOLN  COMPARISON:  None.  FINDINGS: Lung bases are clear.  The liver, spleen, gallbladder, pancreas, adrenal glands, kidneys, abdominal aorta, inferior vena cava, and retroperitoneal lymph nodes are unremarkable. The stomach, small bowel, and colon are normal for degree of distention. Stool fills the colon. No free fluid or free air in the abdomen.  Pelvis: Uterus and ovaries are not enlarged. Involuting cyst demonstrated in the left ovary. Small amount of free pelvic fluid is likely physiologic. Bladder wall is not thickened. Appendix is normal. No evidence of diverticulitis. No significant lymphadenopathy. No destructive bone lesions.  IMPRESSION: No acute process demonstrated in the abdomen or pelvis. Involuting cyst in the left ovary with small amount of free fluid in the pelvis, likely physiologic.   Electronically Signed   By: Lucienne Capers M.D.   On: 10/01/2013 05:09     MDM   Final diagnoses:  Abdominal pain, unspecified abdominal location    Multiple complaints suggestive of a viral syndrome. WBC is noted to have a right shift consistent with viral syndrome. Old records are reviewed and she had been seen in the ED 2 days ago for chest pain which has been present for several  weeks. Strep screen will be obtained and she'll be sent for CT of abdomen and pelvis to rule out significant pathology.  CT is unremarkable. Patient is advised of these findings and she is discharged with a prescription for tramadol.  Delora Fuel, MD [id number]

## 2013-10-01 NOTE — ED Notes (Signed)
CT notified that pt has completed oral contrast solution.

## 2013-10-01 NOTE — ED Notes (Signed)
Pt. reports generalized abdominal pain / hands cramping onset yesterday , denies nausea/vomitting or diarrhea. No fever or chills. No urinary discomfort.

## 2013-10-01 NOTE — Discharge Instructions (Signed)
Abdominal Pain °Many things can cause abdominal pain. Usually, abdominal pain is not caused by a disease and will improve without treatment. It can often be observed and treated at home. Your health care provider will do a physical exam and possibly order blood tests and X-rays to help determine the seriousness of your pain. However, in many cases, more time must pass before a clear cause of the pain can be found. Before that point, your health care provider may not know if you need more testing or further treatment. °HOME CARE INSTRUCTIONS  °Monitor your abdominal pain for any changes. The following actions may help to alleviate any discomfort you are experiencing: °· Only take over-the-counter or prescription medicines as directed by your health care provider. °· Do not take laxatives unless directed to do so by your health care provider. °· Try a clear liquid diet (broth, tea, or water) as directed by your health care provider. Slowly move to a bland diet as tolerated. °SEEK MEDICAL CARE IF: °· You have unexplained abdominal pain. °· You have abdominal pain associated with nausea or diarrhea. °· You have pain when you urinate or have a bowel movement. °· You experience abdominal pain that wakes you in the night. °· You have abdominal pain that is worsened or improved by eating food. °· You have abdominal pain that is worsened with eating fatty foods. °· You have a fever. °SEEK IMMEDIATE MEDICAL CARE IF:  °· Your pain does not go away within 2 hours. °· You keep throwing up (vomiting). °· Your pain is felt only in portions of the abdomen, such as the right side or the left lower portion of the abdomen. °· You pass bloody or black tarry stools. °MAKE SURE YOU: °· Understand these instructions.   °· Will watch your condition.   °· Will get help right away if you are not doing well or get worse.   °Document Released: 11/29/2004 Document Revised: 02/24/2013 Document Reviewed: 10/29/2012 °[employer]® Patient Information  ©2015 [employer]. This information is not intended to replace advice given to you by your health care provider. Make sure you discuss any questions you have with your health care provider. ° °Tramadol tablets °What is this medicine? °TRAMADOL (TRA ma dole) is a pain reliever. It is used to treat moderate to severe pain in adults. °This medicine may be used for other purposes; ask your health care provider or pharmacist if you have questions. °COMMON BRAND NAME(S): Ultram °What should I tell my health care provider before I take this medicine? °They need to know if you have any of these conditions: °-brain tumor °-depression °-drug abuse or addiction °-head injury °-if you frequently drink alcohol containing drinks °-kidney disease or trouble passing urine °-liver disease °-lung disease, asthma, or breathing problems °-seizures or epilepsy °-suicidal thoughts, plans, or attempt; a previous suicide attempt by you or a family member °-an unusual or allergic reaction to tramadol, codeine, other medicines, foods, dyes, or preservatives °-pregnant or trying to get pregnant °-breast-feeding °How should I use this medicine? °Take this medicine by mouth with a full glass of water. Follow the directions on the prescription label. If the medicine upsets your stomach, take it with food or milk. Do not take more medicine than you are told to take. °Talk to your pediatrician regarding the use of this medicine in children. Special care may be needed. °Overdosage: If you think you have taken too much of this medicine contact a poison control center or emergency room at once. °NOTE: This   medicine is only for you. Do not share this medicine with others. °What if I miss a dose? °If you miss a dose, take it as soon as you can. If it is almost time for your next dose, take only that dose. Do not take double or extra doses. °What may interact with this medicine? °Do not take this medicine with any of the following medications: °-MAOIs  like Carbex, Eldepryl, Marplan, Nardil, and Parnate °This medicine may also interact with the following medications: °-alcohol or medicines that contain alcohol °-antihistamines °-benzodiazepines °-bupropion °-carbamazepine or oxcarbazepine °-clozapine °-cyclobenzaprine °-digoxin °-furazolidone °-linezolid °-medicines for depression, anxiety, or psychotic disturbances °-medicines for migraine headache like almotriptan, eletriptan, frovatriptan, naratriptan, rizatriptan, sumatriptan, zolmitriptan °-medicines for pain like pentazocine, buprenorphine, butorphanol, meperidine, nalbuphine, and propoxyphene °-medicines for sleep °-muscle relaxants °-naltrexone °-phenobarbital °-phenothiazines like perphenazine, thioridazine, chlorpromazine, mesoridazine, fluphenazine, prochlorperazine, promazine, and trifluoperazine °-procarbazine °-warfarin °This list may not describe all possible interactions. Give your health care provider a list of all the medicines, herbs, non-prescription drugs, or dietary supplements you use. Also tell them if you smoke, drink alcohol, or use illegal drugs. Some items may interact with your medicine. °What should I watch for while using this medicine? °Tell your doctor or health care professional if your pain does not go away, if it gets worse, or if you have new or a different type of pain. You may develop tolerance to the medicine. Tolerance means that you will need a higher dose of the medicine for pain relief. Tolerance is normal and is expected if you take this medicine for a long time. °Do not suddenly stop taking your medicine because you may develop a severe reaction. Your body becomes used to the medicine. This does NOT mean you are addicted. Addiction is a behavior related to getting and using a drug for a non-medical reason. If you have pain, you have a medical reason to take pain medicine. Your doctor will tell you how much medicine to take. If your doctor wants you to stop the  medicine, the dose will be slowly lowered over time to avoid any side effects. °You may get drowsy or dizzy. Do not drive, use machinery, or do anything that needs mental alertness until you know how this medicine affects you. Do not stand or sit up quickly, especially if you are an older patient. This reduces the risk of dizzy or fainting spells. Alcohol can increase or decrease the effects of this medicine. Avoid alcoholic drinks. °You may have constipation. Try to have a bowel movement at least every 2 to 3 days. If you do not have a bowel movement for 3 days, call your doctor or health care professional. °Your mouth may get dry. Chewing sugarless gum or sucking hard candy, and drinking plenty of water may help. Contact your doctor if the problem does not go away or is severe. °What side effects may I notice from receiving this medicine? °Side effects that you should report to your doctor or health care professional as soon as possible: °-allergic reactions like skin rash, itching or hives, swelling of the face, lips, or tongue °-breathing difficulties, wheezing °-confusion °-itching °-light headedness or fainting spells °-redness, blistering, peeling or loosening of the skin, including inside the mouth °-seizures °Side effects that usually do not require medical attention (report to your doctor or health care professional if they continue or are bothersome): °-constipation °-dizziness °-drowsiness °-headache °-nausea, vomiting °This list may not describe all possible side effects. Call your doctor for medical advice   about side effects. You may report side effects to FDA at 1-800-FDA-1088. °Where should I keep my medicine? °Keep out of the reach of children. °Store at room temperature between 15 and 30 degrees C (59 and 86 degrees F). Keep container tightly closed. Throw away any unused medicine after the expiration date. °NOTE: This sheet is a summary. It may not cover all possible information. If you have  questions about this medicine, talk to your doctor, pharmacist, or health care provider. °© 2015, [employer]. (2009-11-02 11:55:44) ° °

## 2013-10-02 LAB — CULTURE, GROUP A STREP

## 2013-10-05 ENCOUNTER — Other Ambulatory Visit: Payer: Self-pay | Admitting: Family Medicine

## 2013-10-05 DIAGNOSIS — R109 Unspecified abdominal pain: Secondary | ICD-10-CM

## 2013-10-08 ENCOUNTER — Other Ambulatory Visit: Payer: Self-pay | Admitting: Family Medicine

## 2013-10-08 ENCOUNTER — Ambulatory Visit
Admission: RE | Admit: 2013-10-08 | Discharge: 2013-10-08 | Disposition: A | Discharge status: Home / Self Care | Payer: 59 | Source: Ambulatory Visit | Attending: Family Medicine | Admitting: Family Medicine

## 2013-10-08 DIAGNOSIS — R05 Cough: Secondary | ICD-10-CM

## 2013-10-08 DIAGNOSIS — R059 Cough, unspecified: Secondary | ICD-10-CM

## 2013-10-08 DIAGNOSIS — R109 Unspecified abdominal pain: Secondary | ICD-10-CM

## 2013-10-23 ENCOUNTER — Encounter (HOSPITAL_COMMUNITY): Payer: Self-pay | Admitting: Emergency Medicine

## 2013-10-23 ENCOUNTER — Emergency Department (HOSPITAL_COMMUNITY): Payer: 59

## 2013-10-23 DIAGNOSIS — Z87891 Personal history of nicotine dependence: Secondary | ICD-10-CM | POA: Diagnosis not present

## 2013-10-23 DIAGNOSIS — Z8719 Personal history of other diseases of the digestive system: Secondary | ICD-10-CM | POA: Insufficient documentation

## 2013-10-23 DIAGNOSIS — Z8619 Personal history of other infectious and parasitic diseases: Secondary | ICD-10-CM | POA: Diagnosis not present

## 2013-10-23 DIAGNOSIS — Z79899 Other long term (current) drug therapy: Secondary | ICD-10-CM | POA: Diagnosis not present

## 2013-10-23 DIAGNOSIS — J029 Acute pharyngitis, unspecified: Secondary | ICD-10-CM | POA: Diagnosis present

## 2013-10-23 DIAGNOSIS — Z8742 Personal history of other diseases of the female genital tract: Secondary | ICD-10-CM | POA: Insufficient documentation

## 2013-10-23 DIAGNOSIS — L109 Pemphigus, unspecified: Secondary | ICD-10-CM | POA: Diagnosis not present

## 2013-10-23 LAB — RAPID STREP SCREEN (MED CTR MEBANE ONLY): Streptococcus, Group A Screen (Direct): NEGATIVE

## 2013-10-23 NOTE — ED Notes (Signed)
While in w/r pt c/o sob and cp, pulled back to triage room for EKG. Pt remains alert, NAD, calm, interactive, resps e/u, speaking in clear complete sentences, skin W&D, no dyspnea noted. LS CTA. Throat unremarkable.

## 2013-10-23 NOTE — ED Notes (Addendum)
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 States, I have a staph infection. Sees PCP and specialist for the same. C/o sore throat and hands red, burning and swelling. I have blisters on throat. Pt passionate with pressured fast speech. Verbalizes frustration of sx not being dealt with despite tx. This episode onset end of July & beginning of August. Denies fever, mentions chills. Have been taking prednisone, tylenol and ibuprofen. Is not taking an abx. Rates throat pain 10/10. Mentions recent sinus infection, URI, mold. pts emotions easily exacerbated listing off many multiple sx. States, "thay aren't listening to me". Pt reassured. Plan, process and wait explained with rationale. Pt alert, NAD, calm, interactive, resps e/u, speaking in clear complete sentences, no dyspnea noted. VSS.

## 2013-10-24 ENCOUNTER — Emergency Department (HOSPITAL_COMMUNITY)
Admission: EM | Admit: 2013-10-24 | Discharge: 2013-10-24 | Disposition: A | Discharge status: Home / Self Care | Payer: 59 | Attending: Emergency Medicine | Admitting: Emergency Medicine

## 2013-10-24 DIAGNOSIS — J029 Acute pharyngitis, unspecified: Secondary | ICD-10-CM

## 2013-10-24 DIAGNOSIS — L100 Pemphigus vulgaris: Secondary | ICD-10-CM

## 2013-10-24 MED ORDER — OXYCODONE-ACETAMINOPHEN 5-325 MG PO TABS
2.0000 | ORAL_TABLET | Freq: Once | ORAL | Status: AC
  Administered 2013-10-24: 2 via ORAL
  Filled 2013-10-24: qty 2

## 2013-10-24 MED ORDER — TRAMADOL HCL 50 MG PO TABS: 50.0000 mg | ORAL_TABLET | Freq: Four times a day (QID) | ORAL | Status: DC | PRN

## 2013-10-24 NOTE — ED Provider Notes (Signed)
CSN: [id number]     Arrival date & time 10/23/13  2017 History   First MD Initiated Contact with Patient 10/24/13 0047     Chief Complaint  Patient presents with  . Sore Throat  . Hand Pain     (Consider location/radiation/quality/duration/timing/severity/associated sxs/prior Treatment) HPI This patient is a 38 yo woman who comes in with complaint of sore throat and "my lungs burn". She states that she has a history of peptic of old serous and about her symptoms are reminiscent of but difficult or if clear. She says she saw her rheumatologist approximately 10 days ago and he started her on a prednisone burst. She is still taking. She's been using a prescription mouthwash for symptomatic relief. She says that this is not helping.  She also reports significant concern because she was exposed to mold in a house that she was living in for several months. However, she has been out of his house for several months as well.  As far as the patient's pain goes, it is moderate in severity nothing makes it worse or better. It is nonradiating.  Past Medical History  Diagnosis Date  . GERD (gastroesophageal reflux disease)   . H/O varicella   . Trichomonas   . Leukorrhea 04/08/2007    Physiologic  . Abnormal Pap smear 2004  . Yeast infection   . Bacterial infection   . Preterm labor   . Endometriosis    Past Surgical History  Procedure Laterality Date  . Other surgical history      tubal ligation and c-section  . Wisdom tooth extraction    . Cesarean section    . Dilation and curettage of uterus    . Tubal ligation     Family History  Problem Relation Age of Onset  . Hypertension Mother   . COPD Maternal Grandmother     Emphysema   History  Substance Use Topics  . Smoking status: Former Research scientist (life sciences)  . Smokeless tobacco: Not on file  . Alcohol Use: No   OB History   Grav Para Term Preterm Abortions TAB SAB Ect Mult Living   3 3        3      Review of Systems  10 point review of  symptoms obtained and is negative with the exceptions of symptoms noted abov.e   Allergies  Shellfish-derived products  Home Medications   Prior to Admission medications   Medication Sig Start Date End Date Taking? Authorizing Provider  Cyanocobalamin (VITAMIN B-12 PO) Take 1 tablet by mouth daily.   Yes Historical Provider, MD  ibuprofen (ADVIL,MOTRIN) 200 MG tablet Take 600 mg by mouth every 6 (six) hours as needed for mild pain or moderate pain.    Yes Historical Provider, MD  Multiple Vitamins-Minerals (MULTIVITAMIN WITH MINERALS) tablet Take 1 tablet by mouth daily.    Yes Historical Provider, MD  predniSONE (DELTASONE) 10 MG tablet Take 10 mg by mouth See admin instructions. Take six tablets a day for two weeks. Started on 10-20-13 then taper from there per doctor   Yes Historical Provider, MD   BP 127/89  Pulse 72  Temp(Src) 98.2 F (36.8 C) (Oral)  Resp 16  Ht 5\' 3"  (1.6 m)  Wt 160 lb (72.576 kg)  BMI 28.35 kg/m2  SpO2 100%  LMP 10/06/2013 Physical Exam  Gen: well nourished and well developed appearing Head: NCAT Ears: normal to inspection Nose: normal to inspection, no epistaxis or drainage Mouth: oral mucsoa is well hydrated appearing,  normal posterior oropharynx Neck: supple, no stridor CV: RRR, no murmur, palpable peripheral pulses Resp: lung sounds are clear to auscultation bilaterally, no wheeing or rhonchi or rales, normal respiratory effort.  Abd: soft, nontender, nondistended Extremities: normal to inspection.  Skin: warm and dry Neuro: CN ii - XII, no focal deficitis Psyche; normal affect, cooperative.   ED Course  Procedures (including critical care time) Labs Review Labs Reviewed  RAPID STREP SCREEN    Imaging Review Dg Chest 2 View  10/24/2013   CLINICAL DATA:  Sore throat.  Hand pain  EXAM: CHEST  2 VIEW  COMPARISON:  10/08/2013  FINDINGS: Normal heart size and mediastinal contours. No acute infiltrate or edema. No effusion or pneumothorax. No  acute osseous findings.  IMPRESSION: No active cardiopulmonary disease.   Electronically Signed   By: Jorje Guild M.D.   On: 10/24/2013 00:06     EKG Interpretation   Date/Time:  Friday October 23 2013 21:47:14 EDT Ventricular Rate:  75 PR Interval:  140 QRS Duration: 80 QT Interval:  380 QTC Calculation: 424 R Axis:   -43 Text Interpretation:  Normal sinus rhythm Left axis deviation Low voltage  QRS Abnormal ECG No significant change since last tracing Confirmed by  OTTER  MD, OLGA ([id number]) on 10/23/2013 11:17:31 PM      MDM   Patient with sore throat and burning chest pain. Normal VS, physical exam, EKG and CXR. We will manage symptomatically with counsel that she follow up with her rheumatologist on Monday. We will also add H2 blocker with the thought that burning in chest may be secondary to steroid induced gastritis.     Elyn Peers, MD 10/24/13 [phone]

## 2013-10-25 LAB — CULTURE, GROUP A STREP

## 2013-10-27 ENCOUNTER — Emergency Department (HOSPITAL_COMMUNITY)
Admission: EM | Admit: 2013-10-27 | Discharge: 2013-10-27 | Disposition: A | Discharge status: Home / Self Care | Payer: 59 | Attending: Emergency Medicine | Admitting: Emergency Medicine

## 2013-10-27 ENCOUNTER — Emergency Department (HOSPITAL_COMMUNITY): Payer: 59

## 2013-10-27 ENCOUNTER — Encounter (HOSPITAL_COMMUNITY): Payer: Self-pay | Admitting: Emergency Medicine

## 2013-10-27 DIAGNOSIS — Z872 Personal history of diseases of the skin and subcutaneous tissue: Secondary | ICD-10-CM | POA: Diagnosis not present

## 2013-10-27 DIAGNOSIS — R0602 Shortness of breath: Secondary | ICD-10-CM | POA: Insufficient documentation

## 2013-10-27 DIAGNOSIS — R109 Unspecified abdominal pain: Secondary | ICD-10-CM | POA: Diagnosis not present

## 2013-10-27 DIAGNOSIS — R05 Cough: Secondary | ICD-10-CM

## 2013-10-27 DIAGNOSIS — J029 Acute pharyngitis, unspecified: Secondary | ICD-10-CM | POA: Insufficient documentation

## 2013-10-27 DIAGNOSIS — Z8719 Personal history of other diseases of the digestive system: Secondary | ICD-10-CM | POA: Diagnosis not present

## 2013-10-27 DIAGNOSIS — Z8619 Personal history of other infectious and parasitic diseases: Secondary | ICD-10-CM | POA: Diagnosis not present

## 2013-10-27 DIAGNOSIS — Z87891 Personal history of nicotine dependence: Secondary | ICD-10-CM | POA: Diagnosis not present

## 2013-10-27 DIAGNOSIS — M542 Cervicalgia: Secondary | ICD-10-CM | POA: Insufficient documentation

## 2013-10-27 DIAGNOSIS — Z8742 Personal history of other diseases of the female genital tract: Secondary | ICD-10-CM | POA: Insufficient documentation

## 2013-10-27 DIAGNOSIS — J3489 Other specified disorders of nose and nasal sinuses: Secondary | ICD-10-CM | POA: Insufficient documentation

## 2013-10-27 DIAGNOSIS — Z79899 Other long term (current) drug therapy: Secondary | ICD-10-CM | POA: Insufficient documentation

## 2013-10-27 DIAGNOSIS — R059 Cough, unspecified: Secondary | ICD-10-CM | POA: Diagnosis not present

## 2013-10-27 DIAGNOSIS — H9209 Otalgia, unspecified ear: Secondary | ICD-10-CM | POA: Diagnosis not present

## 2013-10-27 HISTORY — DX: Pemphigus vulgaris: L10.0

## 2013-10-27 LAB — CBC
HCT: 38.8 % (ref 36.0–46.0)
Hemoglobin: 13 g/dL (ref 12.0–15.0)
MCH: 32 pg (ref 26.0–34.0)
MCHC: 33.5 g/dL (ref 30.0–36.0)
MCV: 95.6 fL (ref 78.0–100.0)
Platelets: 343 10*3/uL (ref 150–400)
RBC: 4.06 MIL/uL (ref 3.87–5.11)
RDW: 12.9 % (ref 11.5–15.5)
WBC: 11.1 10*3/uL — ABNORMAL HIGH (ref 4.0–10.5)

## 2013-10-27 LAB — COMPREHENSIVE METABOLIC PANEL
ALT: 20 U/L (ref 0–35)
AST: 12 U/L (ref 0–37)
Albumin: 3.3 g/dL — ABNORMAL LOW (ref 3.5–5.2)
Alkaline Phosphatase: 58 U/L (ref 39–117)
Anion gap: 11 (ref 5–15)
BUN: 17 mg/dL (ref 6–23)
CO2: 27 mEq/L (ref 19–32)
Calcium: 8.8 mg/dL (ref 8.4–10.5)
Chloride: 99 mEq/L (ref 96–112)
Creatinine, Ser: 0.88 mg/dL (ref 0.50–1.10)
GFR calc Af Amer: >90 mL/min (ref >90)
GFR calc non Af Amer: 84 mL/min — ABNORMAL LOW (ref >90)
Glucose, Bld: 119 mg/dL — ABNORMAL HIGH (ref 70–99)
Potassium: 3.6 mEq/L — ABNORMAL LOW (ref 3.7–5.3)
Sodium: 137 mEq/L (ref 137–147)
Total Bilirubin: 0.3 mg/dL (ref 0.3–1.2)
Total Protein: 6.7 g/dL (ref 6.0–8.3)

## 2013-10-27 LAB — RAPID STREP SCREEN (MED CTR MEBANE ONLY): Streptococcus, Group A Screen (Direct): NEGATIVE

## 2013-10-27 LAB — I-STAT TROPONIN, ED: Troponin i, poc: 0.01 ng/mL (ref 0.00–0.08)

## 2013-10-27 MED ORDER — AMOXICILLIN 500 MG PO CAPS: 500.0000 mg | ORAL_CAPSULE | Freq: Three times a day (TID) | ORAL | Status: DC

## 2013-10-27 MED ORDER — GI COCKTAIL ~~LOC~~
30.0000 mL | Freq: Once | ORAL | Status: AC
  Administered 2013-10-27: 30 mL via ORAL
  Filled 2013-10-27: qty 30

## 2013-10-27 NOTE — Progress Notes (Signed)
  CARE MANAGEMENT ED NOTE 10/27/2013  Patient:  Christina Allen, Christina Allen   Account Number:  [id number]  Date Initiated:  10/27/2013  Documentation initiated by:  Edwyna Shell  Subjective/Objective Assessment:   38 yo female presenting to the ED with c/o CP     Subjective/Objective Assessment Detail:     Action/Plan:   Patient will continue to follow with PCP   Action/Plan Detail:   Anticipated DC Date:       Status Recommendation to Physician:   Result of Recommendation:  Agreed      Choice offered to / List presented to:            Status of service:  Completed, signed off  ED Comments:   ED Comments Detail:  CM spoke with the patient regarding this ED visit due to the RN note that the patient hasn't been able to fill antibiotic prescription. The patient stated that she has insurance through the affordable care act and she lost her coverage for a short period because her payments lapsed. The patient stated that she has resolved the situation and once again has full coverage. The patient declined assistance with f/u appointments and/or resources and states that she has been seeing her PCP and dermatologist and is able to afford her prescription medications. The patient stated that she came gto the ED becasue here PCP instructed her to come to the ED since she had c/o CP. No other questions or concerns and  CM needs identified.

## 2013-10-27 NOTE — Discharge Instructions (Signed)

## 2013-10-27 NOTE — ED Provider Notes (Signed)
CSN: [id number]     Arrival date & time 10/27/13  1127 History  This chart was scribed for non-physician practitioner Montine Circle, PA-C working with Ephraim Hamburger, MD by Ludger Nutting, ED Scribe. This patient was seen in room C29C/C29C and the patient's care was started at 1:14 PM.    Chief Complaint  Patient presents with  . Shortness of Breath  . Neck Pain    The history is provided by the patient. No language interpreter was used.    HPI Comments: Christina Allen is a 38 y.o. female with past medical history of pemphigus vulgaris, GERD who presents to the Emergency Department complaining of 1-2 weeks of constant, gradually worsening sore throat with multiple associated symptoms. She reports associated right sided otalgia, right sided neck pain, abdominal pain, SOB, sinus pain and pressure, and postnasal drip. She states the sore throat is worse with swallowing. She has been seen by PCP and in the ED multiple times. She states her PCP prescribe her an antibiotic but was unable to fill it due to insurance problems. She states her insurance is working now but she no longer has prescriptions for the medications she needs. She denies cough, fever.   Past Medical History  Diagnosis Date  . GERD (gastroesophageal reflux disease)   . H/O varicella   . Trichomonas   . Leukorrhea 04/08/2007    Physiologic  . Abnormal Pap smear 2004  . Yeast infection   . Bacterial infection   . Preterm labor   . Endometriosis   . Pemphigus vulgaris    Past Surgical History  Procedure Laterality Date  . Other surgical history      tubal ligation and c-section  . Wisdom tooth extraction    . Cesarean section    . Dilation and curettage of uterus    . Tubal ligation     Family History  Problem Relation Age of Onset  . Hypertension Mother   . COPD Maternal Grandmother     Emphysema   History  Substance Use Topics  . Smoking status: Former Research scientist (life sciences)  . Smokeless tobacco: Not on file  . Alcohol Use: No    OB History   Grav Para Term Preterm Abortions TAB SAB Ect Mult Living   3 3        3      Review of Systems  Constitutional: Negative for fever.  HENT: Positive for ear pain, postnasal drip, sinus pressure and sore throat.   Respiratory: Positive for shortness of breath. Negative for cough.   Gastrointestinal: Positive for abdominal pain.  Musculoskeletal: Positive for neck pain.  All other systems reviewed and are negative.     Allergies  Shellfish-derived products  Home Medications   Prior to Admission medications   Medication Sig Start Date End Date Taking? Authorizing Provider  Cyanocobalamin (VITAMIN B-12 PO) Take 1 tablet by mouth daily.    Historical Provider, MD  ibuprofen (ADVIL,MOTRIN) 200 MG tablet Take 600 mg by mouth every 6 (six) hours as needed for mild pain or moderate pain.     Historical Provider, MD  Multiple Vitamins-Minerals (MULTIVITAMIN WITH MINERALS) tablet Take 1 tablet by mouth daily.     Historical Provider, MD  predniSONE (DELTASONE) 10 MG tablet Take 10 mg by mouth See admin instructions. Take six tablets a day for two weeks. Started on 10-20-13 then taper from there per doctor    Historical Provider, MD  traMADol (ULTRAM) 50 MG tablet Take 1 tablet (50 mg total)  by mouth every 6 (six) hours as needed. 10/24/13   Elyn Peers, MD   BP 119/82  Pulse 89  Temp(Src) 98 F (36.7 C) (Oral)  Resp 16  SpO2 100%  LMP 10/06/2013 Physical Exam  Nursing note and vitals reviewed. Constitutional: She is oriented to person, place, and time. She appears well-developed and well-nourished.  HENT:  Head: Normocephalic and atraumatic.  Nose: Right sinus exhibits maxillary sinus tenderness and frontal sinus tenderness. Left sinus exhibits maxillary sinus tenderness and frontal sinus tenderness.  Mouth/Throat: Uvula is midline. Posterior oropharyngeal erythema present. No oropharyngeal exudate or tonsillar abscesses.  Frontal and maxillary sinuses are mildly tender  to palpation. Oropharynx is mildly erythematous. No tonsillar exudate. No evidence of peritonsillar abscess. Uvula midline. Airway intact. No stridor.   Cardiovascular: Normal rate, regular rhythm and normal heart sounds.  Exam reveals no gallop and no friction rub.   No murmur heard. Pulmonary/Chest: Effort normal and breath sounds normal. No respiratory distress. She has no wheezes. She has no rales. She exhibits no tenderness.  Abdominal: Soft. She exhibits no distension. There is no tenderness. There is no rebound and no guarding.  Neurological: She is alert and oriented to person, place, and time.  Skin: Skin is warm and dry.  Psychiatric: She has a normal mood and affect.    ED Course  Procedures (including critical care time)  DIAGNOSTIC STUDIES: Oxygen Saturation is 97% on RA, adequate by my interpretation.    COORDINATION OF CARE: 1:18 PM Discussed treatment plan with pt at bedside and pt agreed to plan.   Results for orders placed during the hospital encounter of 10/27/13  RAPID STREP SCREEN      Result Value Ref Range   Streptococcus, Group A Screen (Direct) NEGATIVE  NEGATIVE  CBC      Result Value Ref Range   WBC 11.1 (*) 4.0 - 10.5 K/uL   RBC 4.06  3.87 - 5.11 MIL/uL   Hemoglobin 13.0  12.0 - 15.0 g/dL   HCT 38.8  36.0 - 46.0 %   MCV 95.6  78.0 - 100.0 fL   MCH 32.0  26.0 - 34.0 pg   MCHC 33.5  30.0 - 36.0 g/dL   RDW 12.9  11.5 - 15.5 %   Platelets 343  150 - 400 K/uL  COMPREHENSIVE METABOLIC PANEL      Result Value Ref Range   Sodium 137  137 - 147 mEq/L   Potassium 3.6 (*) 3.7 - 5.3 mEq/L   Chloride 99  96 - 112 mEq/L   CO2 27  19 - 32 mEq/L   Glucose, Bld 119 (*) 70 - 99 mg/dL   BUN 17  6 - 23 mg/dL   Creatinine, Ser 0.88  0.50 - 1.10 mg/dL   Calcium 8.8  8.4 - 10.5 mg/dL   Total Protein 6.7  6.0 - 8.3 g/dL   Albumin 3.3 (*) 3.5 - 5.2 g/dL   AST 12  0 - 37 U/L   ALT 20  0 - 35 U/L   Alkaline Phosphatase 58  39 - 117 U/L   Total Bilirubin 0.3  0.3 -  1.2 mg/dL   GFR calc non Af Amer 84 (*) >90 mL/min   GFR calc Af Amer >90  >90 mL/min   Anion gap 11  5 - 15  I-STAT TROPOININ, ED      Result Value Ref Range   Troponin i, poc 0.01  0.00 - 0.08 ng/mL   Comment 3  Dg Chest 2 View  10/27/2013   CLINICAL DATA:  Chest pain and burning in throat  EXAM: CHEST  2 VIEW  COMPARISON:  October 23, 2013.  FINDINGS: Lungs are clear. Heart size and pulmonary vascularity are normal. No adenopathy. No pneumothorax. No bone lesions. Trachea appears midline without narrowing.  IMPRESSION: No abnormality noted.   Electronically Signed   By: Lowella Grip M.D.   On: 10/27/2013 13:05   Dg Chest 2 View  10/24/2013   CLINICAL DATA:  Sore throat.  Hand pain  EXAM: CHEST  2 VIEW  COMPARISON:  10/08/2013  FINDINGS: Normal heart size and mediastinal contours. No acute infiltrate or edema. No effusion or pneumothorax. No acute osseous findings.  IMPRESSION: No active cardiopulmonary disease.   Electronically Signed   By: Jorje Guild M.D.   On: 10/24/2013 00:06   Dg Chest 2 View  10/08/2013   CLINICAL DATA:  Chest pain for 2 weeks  EXAM: CHEST  2 VIEW  COMPARISON:  09/28/2013 .  FINDINGS: The heart size and mediastinal contours are within normal limits. Both lungs are clear. The visualized skeletal structures are unremarkable.  IMPRESSION: No active cardiopulmonary disease.   Electronically Signed   By: Kerby Moors M.D.   On: 10/08/2013 10:10   Dg Chest 2 View  09/28/2013   CLINICAL DATA:  Shortness of breath and chest pain.  EXAM: CHEST  2 VIEW  COMPARISON:  PA and lateral chest 09/09/2013.  FINDINGS: Heart size and mediastinal contours are within normal limits. Both lungs are clear. Visualized skeletal structures are unremarkable.  IMPRESSION: Negative exam.   Electronically Signed   By: Inge Rise M.D.   On: 09/28/2013 21:22   US Abdomen Complete  10/08/2013   CLINICAL DATA:  Nausea, vomiting  EXAM: ULTRASOUND ABDOMEN COMPLETE  COMPARISON:  CT  scan 10/01/2013.  FINDINGS: Gallbladder:  No gallstones or wall thickening visualized. No sonographic Murphy sign noted.  Common bile duct:  Diameter: 2.7 mm in diameter  Liver:  No focal lesion identified. Within normal limits in parenchymal echogenicity.  IVC:  No abnormality visualized.  Pancreas:  Visualized portion unremarkable.  Spleen:  Size and appearance within normal limits. Measures up to 3.3 cm in length.  Right Kidney:  Length: 10 cm. Echogenicity within normal limits. No mass or hydronephrosis visualized.  Left Kidney:  Length: 11.6 cm. Echogenicity within normal limits. No mass or hydronephrosis visualized.  Abdominal aorta:  No aneurysm visualized.  Measures up to 2 cm in diameter.  Other findings:  None.  IMPRESSION: Normal abdominal ultrasound.   Electronically Signed   By: Lahoma Crocker M.D.   On: 10/08/2013 09:48   Ct Abdomen Pelvis W Contrast  10/01/2013   CLINICAL DATA:  Generalized abdominal pain. Cramping onset yesterday.  EXAM: CT ABDOMEN AND PELVIS WITH CONTRAST  TECHNIQUE: Multidetector CT imaging of the abdomen and pelvis was performed using the standard protocol following bolus administration of intravenous contrast.  CONTRAST:  171mL OMNIPAQUE IOHEXOL 300 MG/ML  SOLN  COMPARISON:  None.  FINDINGS: Lung bases are clear.  The liver, spleen, gallbladder, pancreas, adrenal glands, kidneys, abdominal aorta, inferior vena cava, and retroperitoneal lymph nodes are unremarkable. The stomach, small bowel, and colon are normal for degree of distention. Stool fills the colon. No free fluid or free air in the abdomen.  Pelvis: Uterus and ovaries are not enlarged. Involuting cyst demonstrated in the left ovary. Small amount of free pelvic fluid is likely physiologic. Bladder wall is not thickened.  Appendix is normal. No evidence of diverticulitis. No significant lymphadenopathy. No destructive bone lesions.  IMPRESSION: No acute process demonstrated in the abdomen or pelvis. Involuting cyst in the  left ovary with small amount of free fluid in the pelvis, likely physiologic.   Electronically Signed   By: Lucienne Capers M.D.   On: 10/01/2013 05:09     Imaging Review Dg Chest 2 View  10/27/2013   CLINICAL DATA:  Chest pain and burning in throat  EXAM: CHEST  2 VIEW  COMPARISON:  October 23, 2013.  FINDINGS: Lungs are clear. Heart size and pulmonary vascularity are normal. No adenopathy. No pneumothorax. No bone lesions. Trachea appears midline without narrowing.  IMPRESSION: No abnormality noted.   Electronically Signed   By: Lowella Grip M.D.   On: 10/27/2013 13:05     EKG Interpretation   Date/Time:  Tuesday October 27 2013 11:37:49 EDT Ventricular Rate:  83 PR Interval:  130 QRS Duration: 80 QT Interval:  364 QTC Calculation: 427 R Axis:   -41 Text Interpretation:  Normal sinus rhythm Left axis deviation Anterior  infarct , age undetermined Abnormal ECG No significant change since last  tracing Confirmed by GOLDSTON  MD, SCOTT ([id number]) on 10/27/2013 1:00:44 PM      MDM   Final diagnoses:  Sore throat  Sinus pressure  Cough    Patient with multiple complaints.  Mainly complaining of sore throat and sinus congestion. She states that she has been sick for a couple of weeks.  Wanted to make certain it was not her pemphigous.  Patient is taking steroids currently for pemphigus.  I will treat the patient with amoxicillin for sinusitis.  Her labs and work up are reassuring.  I have discussed the patient with Dr. Regenia Skeeter, who has seen the patient and agrees with the plan.  The patient is stable and ready for discharge.  I personally performed the services described in this documentation, which was scribed in my presence. The recorded information has been reviewed and is accurate.     Montine Circle, PA-C 10/27/13 1456

## 2013-10-27 NOTE — ED Notes (Signed)
Dr. Verner Chol at bedside along with [facility]

## 2013-10-27 NOTE — ED Notes (Signed)
Was seen here 4 days ago for same throat and neck pain now feels like she is having chest pain and sob is suppose to be on antiobiotics 4 weeks ago but she was unable to get it filled feels lke her ears hurt  She is hoarse and feels like it is getting worse

## 2013-10-28 NOTE — ED Provider Notes (Signed)
Medical screening examination/treatment/procedure(s) were conducted as a shared visit with non-physician practitioner(s) and myself.  I personally evaluated the patient during the encounter.   EKG Interpretation   Date/Time:  Tuesday October 27 2013 11:37:49 EDT Ventricular Rate:  83 PR Interval:  130 QRS Duration: 80 QT Interval:  364 QTC Calculation: 427 R Axis:   -41 Text Interpretation:  Normal sinus rhythm Left axis deviation Anterior  infarct , age undetermined Abnormal ECG No significant change since last  tracing Confirmed by Kristyna Bradstreet  MD, Daphne Karrer ([id number]) on 10/27/2013 1:00:44 PM       Patient with multiple URI type complaints. Likely has sinusitis, will treat with amox.  Ephraim Hamburger, MD 10/28/13 [phone]

## 2013-10-29 LAB — CULTURE, GROUP A STREP

## 2013-11-06 ENCOUNTER — Other Ambulatory Visit: Payer: Self-pay | Admitting: Allergy and Immunology

## 2013-11-06 DIAGNOSIS — J018 Other acute sinusitis: Secondary | ICD-10-CM

## 2013-11-13 ENCOUNTER — Other Ambulatory Visit: Payer: 59

## 2013-11-24 ENCOUNTER — Other Ambulatory Visit: Payer: Self-pay | Admitting: Allergy and Immunology

## 2013-11-24 ENCOUNTER — Ambulatory Visit
Admission: RE | Admit: 2013-11-24 | Discharge: 2013-11-24 | Disposition: A | Discharge status: Home / Self Care | Payer: 59 | Source: Ambulatory Visit | Attending: Allergy and Immunology | Admitting: Allergy and Immunology

## 2013-11-24 DIAGNOSIS — J018 Other acute sinusitis: Secondary | ICD-10-CM

## 2013-12-15 ENCOUNTER — Ambulatory Visit: Payer: 59 | Admitting: Internal Medicine

## 2014-01-04 ENCOUNTER — Encounter (HOSPITAL_COMMUNITY): Payer: Self-pay | Admitting: Emergency Medicine

## 2014-02-15 ENCOUNTER — Emergency Department (HOSPITAL_BASED_OUTPATIENT_CLINIC_OR_DEPARTMENT_OTHER): Payer: 59

## 2014-02-15 ENCOUNTER — Encounter: Payer: Self-pay | Admitting: *Deleted

## 2014-02-15 ENCOUNTER — Emergency Department (HOSPITAL_BASED_OUTPATIENT_CLINIC_OR_DEPARTMENT_OTHER)
Admission: EM | Admit: 2014-02-15 | Discharge: 2014-02-15 | Disposition: A | Discharge status: Home / Self Care | Payer: 59 | Attending: Emergency Medicine | Admitting: Emergency Medicine

## 2014-02-15 DIAGNOSIS — Z792 Long term (current) use of antibiotics: Secondary | ICD-10-CM | POA: Insufficient documentation

## 2014-02-15 DIAGNOSIS — Z79899 Other long term (current) drug therapy: Secondary | ICD-10-CM | POA: Insufficient documentation

## 2014-02-15 DIAGNOSIS — J0100 Acute maxillary sinusitis, unspecified: Secondary | ICD-10-CM

## 2014-02-15 DIAGNOSIS — R0602 Shortness of breath: Secondary | ICD-10-CM | POA: Diagnosis present

## 2014-02-15 DIAGNOSIS — Z872 Personal history of diseases of the skin and subcutaneous tissue: Secondary | ICD-10-CM | POA: Insufficient documentation

## 2014-02-15 DIAGNOSIS — Z7952 Long term (current) use of systemic steroids: Secondary | ICD-10-CM | POA: Diagnosis not present

## 2014-02-15 DIAGNOSIS — Z8719 Personal history of other diseases of the digestive system: Secondary | ICD-10-CM | POA: Insufficient documentation

## 2014-02-15 DIAGNOSIS — R059 Cough, unspecified: Secondary | ICD-10-CM

## 2014-02-15 DIAGNOSIS — Z87891 Personal history of nicotine dependence: Secondary | ICD-10-CM | POA: Insufficient documentation

## 2014-02-15 DIAGNOSIS — Z8742 Personal history of other diseases of the female genital tract: Secondary | ICD-10-CM | POA: Insufficient documentation

## 2014-02-15 DIAGNOSIS — Z8619 Personal history of other infectious and parasitic diseases: Secondary | ICD-10-CM | POA: Diagnosis not present

## 2014-02-15 DIAGNOSIS — R05 Cough: Secondary | ICD-10-CM

## 2014-02-15 HISTORY — DX: Dysmenorrhea, unspecified: N94.6

## 2014-02-15 HISTORY — DX: Excessive and frequent menstruation with regular cycle: N92.0

## 2014-02-15 HISTORY — DX: Acute vaginitis: N76.0

## 2014-02-15 HISTORY — DX: Anal fissure, unspecified: K60.2

## 2014-02-15 MED ORDER — AMOXICILLIN-POT CLAVULANATE 875-125 MG PO TABS: 1.0000 | ORAL_TABLET | Freq: Two times a day (BID) | ORAL | Status: DC

## 2014-02-15 NOTE — ED Notes (Signed)
Pt reports she was exposed to mold for 8 months and patient reports experiencing sore throat still and shortness of breath.  Reports SOB is getting worse and reports it hurts to take a deep breath in.  Pt also reports he mouth stays dry all the time.

## 2014-02-15 NOTE — ED Notes (Signed)
Pt reports history of shortness of breath and throat pain/burning. Sts that she has pain in her throat. Pt has appointments with allergen doctor, was taking singulair and was not getting relief. Also reports "bone aches"

## 2014-02-15 NOTE — Discharge Instructions (Signed)

## 2014-02-15 NOTE — ED Provider Notes (Signed)
CSN: [id number]     Arrival date & time 02/15/14  1811 History   First MD Initiated Contact with Patient 02/15/14 1830     Chief Complaint  Patient presents with  . Shortness of Breath     (Consider location/radiation/quality/duration/timing/severity/associated sxs/prior Treatment) Patient is a 38 y.o. female presenting with shortness of breath.  Shortness of Breath Severity:  Moderate Onset quality:  Gradual Timing:  Constant Progression:  Worsening Chronicity:  New Context: URI   Relieved by:  Nothing Worsened by:  Nothing tried Ineffective treatments:  None tried Associated symptoms: sore throat and swollen glands   Associated symptoms: no abdominal pain and no sputum production   Pt reports she has pemphigus vulgaris.   Pt feels like she has an open area in her mouth.  (hx of blister in mouth).   Pt complains of sinus problems, pressure and drainage.   Pt had a previous Ct that showed severe sinusitis.   Past Medical History  Diagnosis Date  . GERD (gastroesophageal reflux disease)   . H/O varicella   . Trichomonas   . Leukorrhea 04/08/2007    Physiologic  . Abnormal Pap smear 2004  . Yeast infection   . Bacterial infection   . Preterm labor   . Endometriosis   . Pemphigus vulgaris   . Anal fissure   . Dysmenorrhea   . Menorrhagia   . Vaginitis    Past Surgical History  Procedure Laterality Date  . Other surgical history      tubal ligation and c-section  . Wisdom tooth extraction    . Cesarean section    . Dilation and curettage of uterus    . Tubal ligation     Family History  Problem Relation Age of Onset  . Hypertension Mother   . COPD Maternal Grandmother     Emphysema   History  Substance Use Topics  . Smoking status: Former Research scientist (life sciences)  . Smokeless tobacco: Not on file  . Alcohol Use: No   OB History    Gravida Para Term Preterm AB TAB SAB Ectopic Multiple Living   3 3        3      Review of Systems  HENT: Positive for sore throat.    Respiratory: Positive for shortness of breath. Negative for sputum production.   Gastrointestinal: Negative for abdominal pain.  All other systems reviewed and are negative.     Allergies  Shellfish-derived products  Home Medications   Prior to Admission medications   Medication Sig Start Date End Date Taking? Authorizing Provider  amoxicillin (AMOXIL) 500 MG capsule Take 1 capsule (500 mg total) by mouth 3 (three) times daily. 10/27/13   Montine Circle, PA-C  amoxicillin (AMOXIL) 500 MG capsule Take 1 capsule (500 mg total) by mouth 3 (three) times daily. 10/27/13   Montine Circle, PA-C  amoxicillin (AMOXIL) 500 MG capsule Take 1 capsule (500 mg total) by mouth 3 (three) times daily. 10/27/13   Montine Circle, PA-C  Cyanocobalamin (VITAMIN B-12 PO) Take 1 tablet by mouth daily.    Historical Provider, MD  ibuprofen (ADVIL,MOTRIN) 200 MG tablet Take 600 mg by mouth every 6 (six) hours as needed for mild pain or moderate pain.     Historical Provider, MD  Multiple Vitamins-Minerals (MULTIVITAMIN WITH MINERALS) tablet Take 1 tablet by mouth daily.     Historical Provider, MD  predniSONE (DELTASONE) 10 MG tablet Take 10 mg by mouth See admin instructions. Take six tablets a day for two  weeks. Started on 10-20-13 then taper from there per doctor    Historical Provider, MD  traMADol (ULTRAM) 50 MG tablet Take 1 tablet (50 mg total) by mouth every 6 (six) hours as needed. 10/24/13   Elyn Peers, MD   BP 133/87 mmHg  Pulse 94  Temp(Src) 98.4 F (36.9 C) (Oral)  Resp 18  Ht 5\' 5"  (1.651 m)  Wt 180 lb (81.647 kg)  BMI 29.95 kg/m2  SpO2 98% Physical Exam  Constitutional: She appears well-developed and well-nourished.  HENT:  Head: Normocephalic.  Right Ear: External ear normal.  Nose: Nose normal.  Mouth/Throat: Oropharynx is clear and moist.  Eyes: Conjunctivae and EOM are normal. Pupils are equal, round, and reactive to light.  Neck: Normal range of motion.  Cardiovascular: Normal  rate, regular rhythm and normal heart sounds.   Pulmonary/Chest: Effort normal and breath sounds normal.  Abdominal: Soft.  Neurological: She is alert.  Skin: Skin is warm.  Psychiatric: She has a normal mood and affect.    ED Course  Procedures (including critical care time) Labs Review Labs Reviewed - No data to display  Imaging Review Dg Chest 2 View  02/15/2014   CLINICAL DATA:  Sore throat, shortness of breath, mold exposure, hurts to take a deep breath, former smoker  EXAM: CHEST  2 VIEW  COMPARISON:  10/27/2013  FINDINGS: Normal heart size, mediastinal contours, and pulmonary vascularity.  Lungs clear.  No pneumothorax.  Bones unremarkable.  IMPRESSION: Normal exam.   Electronically Signed   By: Lavonia Etta M.D.   On: 02/15/2014 18:40     EKG Interpretation None      MDM   Vitals normal.  No current oral lesions, chest xray is normal.  I think current symptoms are second to sinus infection.   I will treat with Augmentin    Final diagnoses:  Acute maxillary sinusitis, recurrence not specified    Augmentin See your Physicain for evaluation    Fransico Meadow, PA-C 02/15/14 1930  Orlie Dakin, MD 02/15/14 2348

## 2014-02-16 ENCOUNTER — Encounter: Payer: Self-pay | Admitting: Internal Medicine

## 2014-02-16 ENCOUNTER — Ambulatory Visit (INDEPENDENT_AMBULATORY_CARE_PROVIDER_SITE_OTHER): Payer: Medicaid Other | Admitting: Internal Medicine

## 2014-02-16 VITALS — BP 120/76 | HR 97 | Ht 64.0 in | Wt 174.0 lb

## 2014-02-16 DIAGNOSIS — K5909 Other constipation: Secondary | ICD-10-CM

## 2014-02-16 DIAGNOSIS — K219 Gastro-esophageal reflux disease without esophagitis: Secondary | ICD-10-CM

## 2014-02-16 DIAGNOSIS — R1314 Dysphagia, pharyngoesophageal phase: Secondary | ICD-10-CM

## 2014-02-16 DIAGNOSIS — K5900 Constipation, unspecified: Secondary | ICD-10-CM

## 2014-02-16 DIAGNOSIS — R1310 Dysphagia, unspecified: Secondary | ICD-10-CM

## 2014-02-16 MED ORDER — LINACLOTIDE 145 MCG PO CAPS: 145.0000 ug | ORAL_CAPSULE | Freq: Every day | ORAL | Status: DC

## 2014-02-16 MED ORDER — DEXLANSOPRAZOLE 60 MG PO CPDR: 60.0000 mg | DELAYED_RELEASE_CAPSULE | Freq: Every day | ORAL | Status: DC

## 2014-02-16 NOTE — Progress Notes (Signed)
Patient ID: [name], female   DOB: [birth year], 38 y.o.   MRN: [id number] HPI: Christina Allen is a 38 year old female with past medical history of pemphigus, GERD, constipation, history of anal fissure who is seen in consultation at the request of Dr. Ernie Hew evaluation of uncontrolled heartburn. She is here alone today. She reports she's been deal with "very bad" heartburn for years. She is taking omeprazole 40 mg daily and despite this having heartburn most nights. She tries to avoid certain trigger foods. She is having some mild dysphagia and odynophagia. She reports occasionally food such as breads and meats can stick in her esophagus. She denies nausea or vomiting. No weight loss. No hepatobiliary complaint. Previously she took Mercer which worked very well for her heartburn but she is uncertain why this was changed to omeprazole. She also reports issues with constipation which seem to come and go though over the last several months it has predominated. She is having hard small pellet-like stools every 3-4 days. She would consider normal for her a daily bowel movement. She denies blood in her stool or melena. She has tried stool softeners without much benefit. She reports overall abdominal bloating and occasional epigastric pain but this seems to come and go. She denies a known family history of GI tract malignancy or IBD  Past Medical History  Diagnosis Date  . GERD (gastroesophageal reflux disease)   . H/O varicella   . Trichomonas   . Leukorrhea 04/08/2007    Physiologic  . Abnormal Pap smear 2004  . Yeast infection   . Bacterial infection   . Preterm labor   . Endometriosis   . Pemphigus vulgaris   . Anal fissure   . Dysmenorrhea   . Menorrhagia   . Vaginitis     Past Surgical History  Procedure Laterality Date  . Other surgical history      tubal ligation and c-section  . Wisdom tooth extraction    . Cesarean section    . Dilation and curettage of uterus    . Tubal ligation       Outpatient Prescriptions Prior to Visit  Medication Sig Dispense Refill  . amoxicillin (AMOXIL) 500 MG capsule Take 1 capsule (500 mg total) by mouth 3 (three) times daily. 30 capsule 0  . ibuprofen (ADVIL,MOTRIN) 200 MG tablet Take 600 mg by mouth every 6 (six) hours as needed for mild pain or moderate pain.     . Multiple Vitamins-Minerals (MULTIVITAMIN WITH MINERALS) tablet Take 1 tablet by mouth daily.     Marland Kitchen amoxicillin (AMOXIL) 500 MG capsule Take 1 capsule (500 mg total) by mouth 3 (three) times daily. 30 capsule 0  . amoxicillin (AMOXIL) 500 MG capsule Take 1 capsule (500 mg total) by mouth 3 (three) times daily. 30 capsule 0  . amoxicillin-clavulanate (AUGMENTIN) 875-125 MG per tablet Take 1 tablet by mouth every 12 (twelve) hours. 20 tablet 0  . Cyanocobalamin (VITAMIN B-12 PO) Take 1 tablet by mouth daily.    . predniSONE (DELTASONE) 10 MG tablet Take 10 mg by mouth See admin instructions. Take six tablets a day for two weeks. Started on 10-20-13 then taper from there per doctor    . traMADol (ULTRAM) 50 MG tablet Take 1 tablet (50 mg total) by mouth every 6 (six) hours as needed. 15 tablet 0   No facility-administered medications prior to visit.    Allergies  Allergen Reactions  . Shellfish-Derived Products Swelling    Mouth swelling  Family History  Problem Relation Age of Onset  . Hypertension Mother   . COPD Maternal Grandmother     Emphysema    History  Substance Use Topics  . Smoking status: Former Research scientist (life sciences)  . Smokeless tobacco: Not on file  . Alcohol Use: No    ROS: As per history of present illness, otherwise negative  BP 120/76 mmHg  Pulse 97  Ht 5\' 4"  (1.626 m)  Wt 174 lb (78.926 kg)  BMI 29.85 kg/m2  SpO2 98% Constitutional: Well-developed and well-nourished. No distress. HEENT: Normocephalic and atraumatic. Oropharynx is clear and moist. No oropharyngeal exudate. Conjunctivae are normal.  No scleral icterus. Neck: Neck supple. Trachea  midline. Cardiovascular: Normal rate, regular rhythm and intact distal pulses. No M/R/G Pulmonary/chest: Effort normal and breath sounds normal. No wheezing, rales or rhonchi. Abdominal: Soft, nontender, nondistended. Bowel sounds active throughout. There are no masses palpable.  Extremities: no clubbing, cyanosis, or edema Lymphadenopathy: No cervical adenopathy noted. Neurological: Alert and oriented to person place and time. Skin: Skin is warm and dry. No rashes noted. Psychiatric: Normal mood and affect. Behavior is normal.  RELEVANT LABS AND IMAGING: CBC    Component Value Date/Time   WBC 11.1* 10/27/2013 1203   RBC 4.06 10/27/2013 1203   HGB 13.0 10/27/2013 1203   HCT 38.8 10/27/2013 1203   PLT 343 10/27/2013 1203   MCV 95.6 10/27/2013 1203   MCH 32.0 10/27/2013 1203   MCHC 33.5 10/27/2013 1203   RDW 12.9 10/27/2013 1203   LYMPHSABS 3.5 10/01/2013 0005   MONOABS 0.5 10/01/2013 0005   EOSABS 0.1 10/01/2013 0005   BASOSABS 0.0 10/01/2013 0005    CMP     Component Value Date/Time   NA 137 10/27/2013 1203   K 3.6* 10/27/2013 1203   CL 99 10/27/2013 1203   CO2 27 10/27/2013 1203   GLUCOSE 119* 10/27/2013 1203   BUN 17 10/27/2013 1203   CREATININE 0.88 10/27/2013 1203   CALCIUM 8.8 10/27/2013 1203   PROT 6.7 10/27/2013 1203   ALBUMIN 3.3* 10/27/2013 1203   AST 12 10/27/2013 1203   ALT 20 10/27/2013 1203   ALKPHOS 58 10/27/2013 1203   BILITOT 0.3 10/27/2013 1203   GFRNONAA 84* 10/27/2013 1203   GFRAA >90 10/27/2013 1203    ASSESSMENT/PLAN: 38 year old female with past medical history of pemphigus, GERD, constipation, history of anal fissure who is seen in consultation at the request of Dr. Ernie Hew evaluation of uncontrolled heartburn.   1. GERD -- symptoms persist despite daily PPI with omeprazole. I have recommended upper endoscopy given nonresponse PPI. We discussed the test including the risks and benefits and she is agreeable to proceed. I will place her back on  Dexilant 60 mg daily and she is reminded to take this 30 minutes before breakfast daily. GERD diet recommended. Weight loss would also likely benefit reflux symptoms.  2. Chronic constipation -- no response to stool softener. Trial of Linzess 145 g daily. No alarm symptoms such as bleeding. High fiber diet recommended

## 2014-02-16 NOTE — Patient Instructions (Signed)
You have been scheduled for an endoscopy. Please follow written instructions given to you at your visit today. If you use inhalers (even only as needed), please bring them with you on the day of your procedure. Your physician has requested that you go to www.startemmi.com and enter the access code given to you at your visit today. This web site gives a general overview about your procedure. However, you should still follow specific instructions given to you by our office regarding your preparation for the procedure.  We have sent the following medications to your pharmacy for you to pick up at your convenience: Dexilant, Linzess.  These have been sent to [facility] on [address] in [place]   Stop your Prilosec.  The Dexilant will replace the Prilosec.   I appreciate the opportunity to care for you.

## 2014-02-17 ENCOUNTER — Telehealth: Payer: Self-pay | Admitting: *Deleted

## 2014-02-17 ENCOUNTER — Encounter: Payer: Self-pay | Admitting: *Deleted

## 2014-02-17 NOTE — Telephone Encounter (Signed)
Patient's insurance has approved Linzess 145 mcg once daily until 08/09/14. PA # is [id number].

## 2014-03-15 ENCOUNTER — Other Ambulatory Visit: Payer: Self-pay

## 2014-03-15 ENCOUNTER — Encounter: Payer: Self-pay | Admitting: Internal Medicine

## 2014-03-15 ENCOUNTER — Ambulatory Visit (AMBULATORY_SURGERY_CENTER): Payer: 59 | Admitting: Internal Medicine

## 2014-03-15 VITALS — BP 105/75 | HR 88 | Temp 98.9°F | Resp 19 | Ht 64.0 in | Wt 174.0 lb

## 2014-03-15 DIAGNOSIS — K219 Gastro-esophageal reflux disease without esophagitis: Secondary | ICD-10-CM | POA: Diagnosis not present

## 2014-03-15 MED ORDER — SODIUM CHLORIDE 0.9 % IV SOLN: 500.0000 mL | INTRAVENOUS | Status: DC

## 2014-03-15 MED ORDER — LUBIPROSTONE 8 MCG PO CAPS: 8.0000 ug | ORAL_CAPSULE | Freq: Two times a day (BID) | ORAL | Status: DC

## 2014-03-15 NOTE — Op Note (Signed)
[place]  [address] [place], [id number]   ENDOSCOPY PROCEDURE REPORT  PATIENT: Christina, Allen  MR#: [id number] BIRTHDATE: 24-Apr-1975 , 38  yrs. old GENDER: female ENDOSCOPIST: Jerene Bears, MD REFERRED BY:  Rachell Cipro, M.D. PROCEDURE DATE:  03/15/2014 PROCEDURE:  EGD, diagnostic ASA CLASS:     Class II INDICATIONS:  history of esophageal reflux and heartburn. MEDICATIONS: Monitored anesthesia care and Propofol 300 mg IV TOPICAL ANESTHETIC: none  DESCRIPTION OF PROCEDURE: After the risks benefits and alternatives of the procedure were thoroughly explained, informed consent was obtained.  The LB [id number] endoscope was introduced through the mouth and advanced to the second portion of the duodenum , Without limitations.  The instrument was slowly withdrawn as the mucosa was fully examined.      EXAM: The esophagus and gastroesophageal junction were completely normal in appearance.  The stomach was entered and closely examined.The antrum, angularis, and lesser curvature were well visualized, including a retroflexed view of the cardia and fundus. The stomach wall was normally distensible.  The scope passed easily through the pylorus into the duodenum.  Retroflexed views revealed no abnormalities.     The scope was then withdrawn from the patient and the procedure completed.  COMPLICATIONS: There were no immediate complications.  ENDOSCOPIC IMPRESSION: Normal appearing esophagus and GE junction, the stomach was well visualized and normal in appearance, normal appearing duodenum  RECOMMENDATIONS: 1.  Continue taking your PPI (Dexilant) once daily.  It is best to be taken 20-30 minutes prior to breakfast meal. 2.  Office follow-up as needed  eSigned:  Jerene Bears, MD 03/15/2014 4:20 PM    BW:[id number] Ernie Hew, MD and The Patient

## 2014-03-15 NOTE — Patient Instructions (Signed)

## 2014-03-15 NOTE — Progress Notes (Signed)
Report to PACU, RN, vss, BBS= Clear.  

## 2014-03-16 ENCOUNTER — Telehealth: Payer: Self-pay | Admitting: *Deleted

## 2014-03-16 NOTE — Telephone Encounter (Signed)
  Follow up Call-  Call back number 03/15/2014  Post procedure Call Back phone  # [phone]  Permission to leave phone message Yes     Patient questions:  Do you have a fever, pain , or abdominal swelling? No. Pain Score  0 *  Have you tolerated food without any problems? Yes.    Have you been able to return to your normal activities? Yes.    Do you have any questions about your discharge instructions: Diet   No. Medications  No. Follow up visit  No.  Do you have questions or concerns about your Care? No.  Actions: * If pain score is 4 or above: No action needed, pain <4.

## 2014-03-17 ENCOUNTER — Telehealth: Payer: Self-pay | Admitting: *Deleted

## 2014-03-17 NOTE — Telephone Encounter (Signed)
Patient's insurance has denied Amitiza rx [phone] with [employer]). They prefer Linzess instead. May we send Linzess? If so, which dosage?

## 2014-03-17 NOTE — Telephone Encounter (Signed)
She had diarrhea with Linzess 137mcg as rx'ed at last clinic note.  She informed me when she was here for recent procedure With that knowledge is amitiza an alternative?

## 2014-03-18 NOTE — Telephone Encounter (Signed)
I apologize. Not sure how I overlooked that. I have sent for a new prior auth.

## 2014-04-05 DIAGNOSIS — J019 Acute sinusitis, unspecified: Secondary | ICD-10-CM | POA: Insufficient documentation

## 2014-04-30 ENCOUNTER — Ambulatory Visit (INDEPENDENT_AMBULATORY_CARE_PROVIDER_SITE_OTHER): Payer: Medicaid Other | Admitting: Family Medicine

## 2014-04-30 ENCOUNTER — Encounter: Payer: Self-pay | Admitting: Family Medicine

## 2014-04-30 VITALS — BP 120/92 | HR 99 | Temp 98.6°F | Ht 64.75 in | Wt 182.0 lb

## 2014-04-30 DIAGNOSIS — J302 Other seasonal allergic rhinitis: Secondary | ICD-10-CM

## 2014-04-30 DIAGNOSIS — E559 Vitamin D deficiency, unspecified: Secondary | ICD-10-CM

## 2014-04-30 DIAGNOSIS — E669 Obesity, unspecified: Secondary | ICD-10-CM | POA: Insufficient documentation

## 2014-04-30 DIAGNOSIS — Z87891 Personal history of nicotine dependence: Secondary | ICD-10-CM

## 2014-04-30 DIAGNOSIS — E539 Vitamin B deficiency, unspecified: Secondary | ICD-10-CM

## 2014-04-30 DIAGNOSIS — L100 Pemphigus vulgaris: Secondary | ICD-10-CM

## 2014-04-30 DIAGNOSIS — Z23 Encounter for immunization: Secondary | ICD-10-CM

## 2014-04-30 DIAGNOSIS — K589 Irritable bowel syndrome without diarrhea: Secondary | ICD-10-CM

## 2014-04-30 DIAGNOSIS — J329 Chronic sinusitis, unspecified: Secondary | ICD-10-CM

## 2014-04-30 DIAGNOSIS — Z0000 Encounter for general adult medical examination without abnormal findings: Secondary | ICD-10-CM

## 2014-04-30 DIAGNOSIS — Z7689 Persons encountering health services in other specified circumstances: Secondary | ICD-10-CM

## 2014-04-30 DIAGNOSIS — R946 Abnormal results of thyroid function studies: Secondary | ICD-10-CM

## 2014-04-30 DIAGNOSIS — Z7189 Other specified counseling: Secondary | ICD-10-CM

## 2014-04-30 LAB — LIPID PANEL
Cholesterol: 189 mg/dL (ref 0–200)
HDL: 53 mg/dL (ref >=46)
LDL Cholesterol: 124 mg/dL — ABNORMAL HIGH (ref 0–99)
Total CHOL/HDL Ratio: 3.6 Ratio
Triglycerides: 62 mg/dL (ref <150)
VLDL: 12 mg/dL (ref 0–40)

## 2014-04-30 LAB — BASIC METABOLIC PANEL
BUN: 9 mg/dL (ref 6–23)
CO2: 26 mEq/L (ref 19–32)
Calcium: 8.9 mg/dL (ref 8.4–10.5)
Chloride: 105 mEq/L (ref 96–112)
Creat: 1.02 mg/dL (ref 0.50–1.10)
Glucose, Bld: 125 mg/dL — ABNORMAL HIGH (ref 70–99)
Potassium: 3.5 mEq/L (ref 3.5–5.3)
Sodium: 141 mEq/L (ref 135–145)

## 2014-04-30 LAB — CBC
HCT: 40.6 % (ref 36.0–46.0)
Hemoglobin: 13.9 g/dL (ref 12.0–15.0)
MCH: 31.3 pg (ref 26.0–34.0)
MCHC: 34.2 g/dL (ref 30.0–36.0)
MCV: 91.4 fL (ref 78.0–100.0)
MPV: 9.9 fL (ref 8.6–12.4)
Platelets: 437 10*3/uL — ABNORMAL HIGH (ref 150–400)
RBC: 4.44 MIL/uL (ref 3.87–5.11)
RDW: 13.8 % (ref 11.5–15.5)
WBC: 7.4 10*3/uL (ref 4.0–10.5)

## 2014-04-30 LAB — POCT GLYCOSYLATED HEMOGLOBIN (HGB A1C): Hemoglobin A1C: 6.2

## 2014-04-30 LAB — TSH: TSH: 3.063 u[IU]/mL (ref 0.350–4.500)

## 2014-04-30 LAB — VITAMIN B12: Vitamin B-12: 548 pg/mL (ref 211–911)

## 2014-04-30 MED ORDER — LORATADINE 10 MG PO TABS
10.0000 mg | ORAL_TABLET | Freq: Every day | ORAL | Status: DC
Start: 2014-04-30 — End: 2015-11-17

## 2014-04-30 NOTE — Progress Notes (Signed)
Subjective: Christina Allen is a 39 y.o. female presenting to establish general primary care.  Was previously seen by Dr. Rachell Cipro off [address].    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 She was diagnosed with pemphigus vulgaris at age 35, sees Dr. Jarome Matin for dermatology for the past many years. She is on low dose prednisone as needed for flares, though she is commonly on this medication. Only bad flare was at diagnosis at age 40.   Has a history of severe GERD symptoms, though EGD by Dr. Hilarie Fredrickson 03/15/2014 showed no evidence of gastritis, hernia, nor esophagitis. Dexilant once daily was recommended. Also has a history of IBS mixed type, taking linzess.   She also states she has "bad sinuses" and allergies related to being exposed to mold. She sees an allergist, and has been taking flonase, and was prescribed claritin in the past but has no coverage for this.   Health Maintenance:  - Last pap: negative without cotesting 08/15/2011 and another normal in Fall of 2015 prior to Nexplanon insertion by Dr. Everett Graff.  - Had an abnormal pap smear > 10 years ago.  - Mammo: Reports "negative" mammogram in the past few months. - Needs HIV test - Needs vaccinations: influenza, TDaP (doesn't think it's been within past 10 years) - Review of Systems: Denies fever, chills, weight loss, changes in vision or hearing, headache, cough, sore throat, chest pain, palpitations, shortness of breath, abdominal pain, nausea, vomiting, changes in bowel habits, blood in stool, change in bladder habits, myalgias, arthralgias, and rash.  - Past Medical/Surgical History: Reviewed and updated including outside sources. Pertinent negatives include: No asthma, eczema, clotting disorders, depression, anxiety, or sickle cell trait (though her child has trait) - Family History: reviewed and updated -  Medications: reviewed and updated - Allergies: Shellfish: Mouth swelling; seasonal (especially mold)  Objective: BP 120/92 mmHg  Pulse 99  Temp(Src) 98.6 F (37 C) (Oral)  Ht 5' 4.75" (1.645 m)  Wt 182 lb (82.555 kg)  BMI 30.51 kg/m2 Gen: Well-appearing 39 y.o.female in NAD HEENT: Normocephalic, sclerae/conjunctivae clear, PERRL, MMM, posterior oropharynx clear, fair dentition Neck: neck supple, no masses appreciated; thyroid not enlarged  Pulm: Non-labored; CTAB, no wheezes  CV: Regular rate, no murmur appreciated; distal pulses intact/symmetric; no LE edema GI: Normoactive BS; soft, non-tender, non-distended, no HSM, no hernia GU: Deferred Lymph: No cervical, supraclavicular, or axillary lymphadenopathy Skin: 1 inch transverse scare inferior to umbilicus and well healed LTCS scar. Otherwise no rashes, wounds, ulcers MSK: Normal gait and station; no digital clubbing/cyanosis, no frank joint deformity/effusion, full active ROM, no point muscle/bony tenderness in spine Neuro: CN II-XII without deficits, sensation intact to light touch, patellar DTRs 2+ bilaterally Psych: A&Ox3, mood "good" with broad affect, intact recent and remote memory, good judgment, good insight, speech clear and coherent  Assessment/Plan: CANARY FISTER is a 39 y.o. female establishing care.   Healthcare maintenance:  - Pt left prior to receiving vaccinations; will discuss this on call with results (might not have given sample for HIV Ab either)  - See also problem based charting.

## 2014-04-30 NOTE — Assessment & Plan Note (Signed)
No results found, but history of treatment without current treatment. Will recheck this and treat as indicated. Increased risk in Af Amer.

## 2014-04-30 NOTE — Assessment & Plan Note (Signed)
Prescribed loratadine (covered on medicaid formulary); to continue care with allergist prn.

## 2014-04-30 NOTE — Assessment & Plan Note (Signed)
No symptoms at this time.  

## 2014-04-30 NOTE — Assessment & Plan Note (Signed)
History of this without current tx. Will recheck B12 and CBC for indices. Not a vegan.

## 2014-04-30 NOTE — Assessment & Plan Note (Signed)
Pt reported unknown abnormality, and on previous problem list. Also has mild obesity and "constantly cold." Will check TSH.

## 2014-04-30 NOTE — Assessment & Plan Note (Signed)
BMI right at 30. Discussed briefly lifestyle modifications. Will discuss in more depth at next office visit.  Checking A1c (also at high risk from [facility] and prednisone use), lipid panel (no record), TSH, CBC, BMP.

## 2014-04-30 NOTE — Patient Instructions (Signed)
Thank you for coming in today!  We are checking some labs today, and we'll call you about those results and what kind of follow up we need from there.   Our clinic's number is [phone]. Feel free to call any time with questions or concerns. We will answer any questions after hours with our 24-hour emergency line at that number as well.   - Dr. Bonner Puna

## 2014-05-01 ENCOUNTER — Telehealth: Payer: Self-pay | Admitting: Family Medicine

## 2014-05-01 DIAGNOSIS — L100 Pemphigus vulgaris: Secondary | ICD-10-CM

## 2014-05-01 DIAGNOSIS — Z8639 Personal history of other endocrine, nutritional and metabolic disease: Secondary | ICD-10-CM

## 2014-05-01 DIAGNOSIS — E785 Hyperlipidemia, unspecified: Secondary | ICD-10-CM | POA: Insufficient documentation

## 2014-05-01 DIAGNOSIS — E559 Vitamin D deficiency, unspecified: Secondary | ICD-10-CM

## 2014-05-01 DIAGNOSIS — R946 Abnormal results of thyroid function studies: Secondary | ICD-10-CM

## 2014-05-01 DIAGNOSIS — Z9189 Other specified personal risk factors, not elsewhere classified: Secondary | ICD-10-CM | POA: Insufficient documentation

## 2014-05-01 LAB — VITAMIN D 25 HYDROXY (VIT D DEFICIENCY, FRACTURES): Vit D, 25-Hydroxy: 14 ng/mL — ABNORMAL LOW (ref 30–100)

## 2014-05-01 NOTE — Assessment & Plan Note (Signed)
Plan placed in overview; also discussed sun exposure and vegetable intake.

## 2014-05-01 NOTE — Telephone Encounter (Addendum)
Received results from yesterday's office visit. Problem list with plans updated. Also mild thrombocytosis of uncertain significance noted.  - Have attempted to call pt to discuss starting weekly vitamin D supplementation and follow up around April 30 for recheck without success. Will Rx this and continue attempts.

## 2014-05-01 NOTE — Assessment & Plan Note (Signed)
Lipid Panel 04/30/2014 CHOL 189  TRIG 62  HDL 53  CHOL:HDL 3.6  LDL (CALC) 124*  ASCVD Risk: 27% lifetime (39% if 55yr smoking hx included); but no evidence of benefit in 36 yo without diabetes or clinical ASCVD. Will discuss lifestyle changes as usual at next visit.

## 2014-05-01 NOTE — Assessment & Plan Note (Signed)
B12 wnl 04/30/2014

## 2014-05-01 NOTE — Assessment & Plan Note (Addendum)
TSH normal (3.063 on 04/30/2014); no palpable nodule/cyst. Will monitor thyroid function as signs / symptoms indicate.

## 2014-05-02 MED ORDER — CHOLECALCIFEROL 1.25 MG (50000 UT) PO TABS: 50000.0000 [IU] | ORAL_TABLET | ORAL | Status: DC

## 2014-05-03 ENCOUNTER — Telehealth: Payer: Self-pay | Admitting: Family Medicine

## 2014-05-03 NOTE — Telephone Encounter (Signed)
Pt called and would like a medication similar to Claritin that would be covered by Medicaid so that she doesn't have to pay. jw

## 2014-05-03 NOTE — Telephone Encounter (Signed)
Spoke with pt an informed her of below.  She will call and schedule an appt for a follow up. Katharina Caper, Loranzo Desha D

## 2014-05-03 NOTE — Telephone Encounter (Signed)
[facility] Pharmacist called stating they don't carry Cholecalciferol 50000 UNITS TABS.  They would like it change Egocholecaliferol 50,000 Units.  If changing please send new Rx.  Derl Barrow, RN

## 2014-05-04 ENCOUNTER — Ambulatory Visit (INDEPENDENT_AMBULATORY_CARE_PROVIDER_SITE_OTHER): Payer: Medicaid Other | Admitting: *Deleted

## 2014-05-04 ENCOUNTER — Encounter: Payer: Self-pay | Admitting: *Deleted

## 2014-05-04 DIAGNOSIS — Z0184 Encounter for antibody response examination: Secondary | ICD-10-CM

## 2014-05-04 DIAGNOSIS — Z23 Encounter for immunization: Secondary | ICD-10-CM

## 2014-05-04 DIAGNOSIS — Z111 Encounter for screening for respiratory tuberculosis: Secondary | ICD-10-CM

## 2014-05-04 NOTE — Progress Notes (Signed)
Pt in nurse clinic today for PPD placement.  Pt gave nurse a form for PCP to complete.  Form placed in provider for completion.  Pt will return on 05/06/2014 for PPD reading and requesting form to be picked up at that time.  Derl Barrow, RN

## 2014-05-04 NOTE — Progress Notes (Signed)
   PPD placed Left Forearm.  Pt to return 05/06/2014 for reading.  Pt tolerated intradermal injection.  MMR and Varicella titers done today; verbal order given by Dr. Andria Frames, Derl Barrow, RN

## 2014-05-04 NOTE — Telephone Encounter (Signed)
Pharmacist with [facility] called to check status of changing pt's Rx to Egocholecaliferol 50,000 Units.  Please advise.  Derl Barrow, RN

## 2014-05-05 ENCOUNTER — Telehealth: Payer: Self-pay | Admitting: Family Medicine

## 2014-05-05 ENCOUNTER — Telehealth: Payer: Self-pay | Admitting: Internal Medicine

## 2014-05-05 LAB — MEASLES/MUMPS/RUBELLA IMMUNITY
Mumps IgG: 5.33 AU/mL (ref <9.00)
Rubella: 4.6 Index — ABNORMAL HIGH (ref <0.90)
Rubeola IgG: >300 AU/mL — ABNORMAL HIGH (ref <25.00)

## 2014-05-05 LAB — VARICELLA ZOSTER ANTIBODY, IGG: Varicella IgG: 2477 Index — ABNORMAL HIGH (ref <135.00)

## 2014-05-05 MED ORDER — VITAMIN D (ERGOCALCIFEROL) 1.25 MG (50000 UNIT) PO CAPS: 50000.0000 [IU] | ORAL_CAPSULE | ORAL | Status: DC

## 2014-05-05 NOTE — Telephone Encounter (Signed)
Pt called and wants to know why she can not get her vitamin D pills with her medicaid. Please let patient if she has to pay or use her medicaid jw

## 2014-05-05 NOTE — Telephone Encounter (Signed)
Done

## 2014-05-05 NOTE — Progress Notes (Signed)
Form completed and signed. She requires TDaP vaccination to be up to date, so this area was left blank. Could you please administer this during the PPD reading? If so, then "yes" box can be checked by the "appropriate immunizations" section. Thank you!

## 2014-05-06 ENCOUNTER — Ambulatory Visit (INDEPENDENT_AMBULATORY_CARE_PROVIDER_SITE_OTHER): Payer: Medicaid Other | Admitting: *Deleted

## 2014-05-06 ENCOUNTER — Encounter: Payer: Self-pay | Admitting: *Deleted

## 2014-05-06 DIAGNOSIS — Z7689 Persons encountering health services in other specified circumstances: Secondary | ICD-10-CM

## 2014-05-06 DIAGNOSIS — Z111 Encounter for screening for respiratory tuberculosis: Secondary | ICD-10-CM

## 2014-05-06 LAB — TB SKIN TEST
Induration: 0 mm
TB Skin Test: NEGATIVE

## 2014-05-06 MED ORDER — DEXLANSOPRAZOLE 60 MG PO CPDR: 60.0000 mg | DELAYED_RELEASE_CAPSULE | Freq: Every day | ORAL | Status: DC

## 2014-05-06 NOTE — Telephone Encounter (Signed)
Per pharmacy, patient just needs a refill on Dexilant. I will send script.

## 2014-05-06 NOTE — Progress Notes (Signed)
   PPD Reading Note PPD read and results entered in [facility]. Result: 0 mm induration. Interpretation: negative  Allergic reaction: no Dr. Lindell Noe review pt's MMR and Varicella titers.  Pt is immune to varicella, measles and rubella.  Pt does not have any low immunity to Mumps.  Pt offered an appt to return in 4 weeks to received MMR vaccine, but declined.  Pt will think about the vaccine.  Derl Barrow, RN

## 2014-05-06 NOTE — Progress Notes (Signed)
Pt picked up form during PPD Reading.  Derl Barrow, RN

## 2014-05-07 ENCOUNTER — Encounter: Payer: Self-pay | Admitting: *Deleted

## 2014-05-07 ENCOUNTER — Encounter: Payer: Self-pay | Admitting: Family Medicine

## 2014-05-13 ENCOUNTER — Telehealth: Payer: Self-pay | Admitting: *Deleted

## 2014-05-13 MED ORDER — PANTOPRAZOLE SODIUM 40 MG PO TBEC: 40.0000 mg | DELAYED_RELEASE_TABLET | Freq: Every day | ORAL | Status: DC

## 2014-05-13 NOTE — Telephone Encounter (Signed)
Per [facility], insurance has denied patient's Dexilant rx. Insurance prefers Nexium (RX or OTC), omeprazole (which patient has tried previously), pantoprazole (generic and name brand) and prilosec OTC. We will try patient on pantoprazole in place of Dexilant.

## 2014-05-17 ENCOUNTER — Telehealth: Payer: Self-pay | Admitting: Internal Medicine

## 2014-05-17 NOTE — Telephone Encounter (Signed)
I have spoken to patient. She states that pharmacy does not have pantoprazole rx. I advised that rx was sent to [facility] and [place] and receipt was confirmed by pharmacy on 05/13/14. Unfortunately, patient's phone reception was not great. I attempted to call back to hopefully get better reception but had to leave a message instead. I asked that patient call me back if this was the incorrect pharmacy or if she is again told there is no prescription.

## 2014-05-18 ENCOUNTER — Encounter: Payer: Self-pay | Admitting: Family Medicine

## 2014-05-18 ENCOUNTER — Other Ambulatory Visit: Payer: Self-pay | Admitting: Family Medicine

## 2014-05-18 ENCOUNTER — Ambulatory Visit (INDEPENDENT_AMBULATORY_CARE_PROVIDER_SITE_OTHER): Payer: Medicaid Other | Admitting: Family Medicine

## 2014-05-18 VITALS — BP 122/85 | HR 103 | Temp 98.3°F | Wt 184.0 lb

## 2014-05-18 DIAGNOSIS — R0600 Dyspnea, unspecified: Secondary | ICD-10-CM

## 2014-05-18 DIAGNOSIS — J302 Other seasonal allergic rhinitis: Secondary | ICD-10-CM

## 2014-05-18 MED ORDER — ONDANSETRON HCL 4 MG PO TABS: 4.0000 mg | ORAL_TABLET | Freq: Three times a day (TID) | ORAL | Status: DC | PRN

## 2014-05-18 MED ORDER — MOMETASONE FUROATE 50 MCG/ACT NA SUSP: 2.0000 | Freq: Every day | NASAL | Status: DC

## 2014-05-18 MED ORDER — OLOPATADINE HCL 0.2 % OP SOLN: 1.0000 [drp] | Freq: Every day | OPHTHALMIC | Status: DC

## 2014-05-18 NOTE — Patient Instructions (Signed)
Great to meet you!  Mold has to be addressed with your landlord, I am sorry you are struggling with this  I have given you allergic eye drops, and a slightly stronger nasal spray   I will let you know about your chest x ray.

## 2014-05-18 NOTE — Assessment & Plan Note (Signed)
Mild dyspnea associated with her chronic illness  evaluate with chest x-ray to rule out pneumonia

## 2014-05-18 NOTE — Assessment & Plan Note (Signed)
Allergic rhinitis, allergic conjunctivitis, and postnasal drip all exacerbated likely from milldew inside her apartment Encouraged her to work with her landlord to get the water damage and dressed For now will step up therapy for allergic symptoms, changing Flonase to Nasonex Adding Pataday drops Also check chest x-ray to rule out pneumonia with current illness With nausea given a small amount of Zofran, this is possibly due to her congestion.

## 2014-05-18 NOTE — Progress Notes (Signed)
Patient ID: Christina Allen, female   DOB: 1975-10-14, 39 y.o.   MRN: [id number]   HPI  Patient presents today for sinus drainage, headache, and nausea  Patient explains that she's had symptoms of sinus drainage, frequent throat clearing, nausea with 1-2 episodes of nonbloody and nonbilious emesis, and intermittent headache. She also notes mild central chest pain with deep inspiration. She also notes mild dyspnea but denies cough.   She states that she feels all of these symptoms are due to mold in her apartment. She states that a pipe burst impulsive water damage leading to the development of some mold. She states that since that time she's had these symptoms. Her children also had headaches and worsening allergy symptoms.  She's been using Flonase without much improvement. She is taking Zyrtec as well. She also has bilateral ear pain.  Smoking status noted ROS: Per HPI  Objective: BP 122/85 mmHg  Pulse 103  Temp(Src) 98.3 F (36.8 C) (Oral)  Wt 184 lb (83.462 kg) Gen: NAD, alert, cooperative with exam HEENT: NCAT, TMs WNL BL, MMM CV: RRR, good S1/S2, no murmur  Resp: Nonlabored, coarse expiratory sounds in bilateral bases, no wheezes Ext: No edema, warm Neuro: Alert and oriented, No gross deficits  Assessment and plan:  Dyspnea Mild dyspnea associated with her chronic illness  evaluate with chest x-ray to rule out pneumonia   Allergic rhinitis Allergic rhinitis, allergic conjunctivitis, and postnasal drip all exacerbated likely from milldew inside her apartment Encouraged her to work with her landlord to get the water damage and dressed For now will step up therapy for allergic symptoms, changing Flonase to Nasonex Adding Pataday drops Also check chest x-ray to rule out pneumonia with current illness With nausea given a small amount of Zofran, this is possibly due to her congestion.    Orders Placed This Encounter  Procedures  . DG Chest 2 View    Standing Status: Future       Number of Occurrences:      Standing Expiration Date: 07/18/2015    Order Specific Question:  Reason for Exam (SYMPTOM  OR DIAGNOSIS REQUIRED)    Answer:  dyspnea and congestion    Order Specific Question:  Is the patient pregnant?    Answer:  No    Order Specific Question:  Preferred imaging location?    Answer:  [id number]    Meds ordered this encounter  Medications  . mometasone (NASONEX) 50 MCG/ACT nasal spray    Sig: Place 2 sprays into the nose daily.    Dispense:  17 g    Refill:  12  . Olopatadine HCl 0.2 % SOLN    Sig: Apply 1 drop to eye daily.    Dispense:  2.5 mL    Refill:  3  . ondansetron (ZOFRAN) 4 MG tablet    Sig: Take 1 tablet (4 mg total) by mouth every 8 (eight) hours as needed for nausea or vomiting.    Dispense:  20 tablet    Refill:  0

## 2014-05-19 ENCOUNTER — Ambulatory Visit
Admission: RE | Admit: 2014-05-19 | Discharge: 2014-05-19 | Disposition: A | Discharge status: Home / Self Care | Payer: Medicaid Other | Source: Ambulatory Visit | Attending: Family Medicine | Admitting: Family Medicine

## 2014-05-19 DIAGNOSIS — R0600 Dyspnea, unspecified: Secondary | ICD-10-CM

## 2014-05-20 ENCOUNTER — Telehealth: Payer: Self-pay | Admitting: Family Medicine

## 2014-05-20 NOTE — Telephone Encounter (Signed)
Pt informed. Audreana Hancox, CMA  

## 2014-05-20 NOTE — Telephone Encounter (Signed)
Normal chest xray to rule out pneumonia.   Will ask nursing to inform.   Laroy Apple, MD [place] Resident, PGY-3 05/20/2014, 11:07 AM

## 2014-06-01 ENCOUNTER — Telehealth: Payer: Self-pay | Admitting: Internal Medicine

## 2014-06-02 ENCOUNTER — Ambulatory Visit (INDEPENDENT_AMBULATORY_CARE_PROVIDER_SITE_OTHER): Payer: Medicaid Other | Admitting: Family Medicine

## 2014-06-02 ENCOUNTER — Encounter: Payer: Self-pay | Admitting: Family Medicine

## 2014-06-02 VITALS — BP 117/80 | HR 85 | Temp 98.7°F | Ht 65.0 in | Wt 184.2 lb

## 2014-06-02 DIAGNOSIS — J3089 Other allergic rhinitis: Secondary | ICD-10-CM

## 2014-06-02 DIAGNOSIS — L100 Pemphigus vulgaris: Secondary | ICD-10-CM

## 2014-06-02 DIAGNOSIS — J0100 Acute maxillary sinusitis, unspecified: Secondary | ICD-10-CM

## 2014-06-02 MED ORDER — AMOXICILLIN-POT CLAVULANATE 875-125 MG PO TABS: 1.0000 | ORAL_TABLET | Freq: Two times a day (BID) | ORAL | Status: DC

## 2014-06-02 NOTE — Assessment & Plan Note (Signed)
Will refer to dermatology for resumption of care following change of insurance. No symptoms of flare today. Pt not on prednisone.

## 2014-06-02 NOTE — Patient Instructions (Signed)
It was good to see you. I think your illness is due to a bacterial infection of your sinus cavities. You need to take augmentin (an antibiotic) twice a day for 10 days. Take all of this even if you are feeling better in the next day or two. You can also take tylenol. Keep using claritin, nasonex, netty pot and dehumidifier. Come back to see [place] if it's not getting better in the next few days.

## 2014-06-02 NOTE — Telephone Encounter (Signed)
Left message for pt to call back.  Spoke with pt and she will increase the protonix. Pt will call back if this does not help and may have GES if this does not help.

## 2014-06-02 NOTE — Telephone Encounter (Signed)
Increase Protonix to 40 mg before breakfast and dinner Gastric emptying scan

## 2014-06-02 NOTE — Assessment & Plan Note (Signed)
Therapy in primary care setting maximized without improvement. Needs removal from allergens and will send letter to clinic to assist in any way necessary. Will refer to allergy (has been seen before).

## 2014-06-02 NOTE — Telephone Encounter (Signed)
Pyrtle pt with reflux states protonix is not helping. Pt states she is having lots of heartburn, nausea and feels like her stomach is full of acid. Pt has taken dexilant but her insurance would not pay for that any more. She has also taken omeprazole and nexium that did not help. Dr. Deatra Ina as doc of the day please advise.

## 2014-06-02 NOTE — Progress Notes (Signed)
One of the available preceptor of the day.

## 2014-06-02 NOTE — Assessment & Plan Note (Addendum)
Symptoms duration > 10 days despite symptomatic therapy argues for bacterial etiology.  - Augmentin BID x 10 days Continue:  - Netty pot - Humidifier - Intranasal steroid - No decongestant as there is no evidence of eustachian tube dysfunction

## 2014-06-02 NOTE — Progress Notes (Signed)
Subjective: Christina Allen is a 39 y.o. female patient of mine presenting for continued sinus symptoms.  She reports frequent clear and recently profuse mucopurulent nasal discharge with associated cough with associated moderate pleuritic chest pain. She was evaluated recently in this clinic for this with negative CXR and stepped up allergic therapies. She has continued this without improvement in symptoms and since has in fact felt worse with low grade unmeasured fevers. This, she believes, is related to mold/mildew exposure in a previous apartment and again in her current apartment and her daughter is showing symptoms. She notes frontal headache. No changes in vision, epistaxis, neck rigidity, N/V/D, rashes, arthralgias.    She also requests referral to dermatologist for ongoing management of pemphigus. She has recently changed insurances.   - ROS: As above - Nonsmoker since 2005  Objective: BP 117/80 mmHg  Pulse 85  Temp(Src) 98.7 F (37.1 C) (Oral)  Ht 5\' 5"  (1.651 m)  Wt 184 lb 3.2 oz (83.553 kg)  BMI 30.65 kg/m2  SpO2 98% Gen: Somewhat ill-appearing 39 y.o. female in no distress HEENT: Sclerae clear, conjunctivae normal, pupils equal and reactive, nares bright red with clear discharge between edematous turbinates, tenderness to maxillary percussion, moist mucous membranes, posterior oropharynx clear with cobblestoning, good dentition Neck: supple CV: Regular rate, no murmur Pulm: Non-labored breathing ambient air; CTAB, no wheezes or crackles  Assessment/Plan: Christina Allen is a 39 y.o. female here for worsening nasal symptoms in the setting of poorly controlled allergic rhinitis.  See problem list for plan.

## 2014-06-07 ENCOUNTER — Telehealth: Payer: Self-pay | Admitting: Family Medicine

## 2014-06-07 ENCOUNTER — Telehealth: Payer: Self-pay | Admitting: Internal Medicine

## 2014-06-07 NOTE — Telephone Encounter (Signed)
Need to speak with nurse regarding allergies.  Need advice on what to take.

## 2014-06-08 NOTE — Telephone Encounter (Signed)
Pyrtle pt with reflux states protonix is not helping. Pt states she is having lots of heartburn, nausea and feels like her stomach is full of acid. Pt has taken dexilant but her insurance would not pay for that any more. She has also taken omeprazole and nexium that did not help.   Pt called last week and Dr. Deatra Ina increased her protonix to BID. Pt is calling back saying that this has not helped either. Please advise.

## 2014-06-08 NOTE — Telephone Encounter (Signed)
Left message for pt to call back  °

## 2014-06-08 NOTE — Telephone Encounter (Signed)
As I recall Dexilant was helping, 60 mg daily Would we be able to get this approved now that she has failed pantoprazole, omeprazole and esomeprazole? If not then try lansoprazole 30 mg BID (sister to [place])

## 2014-06-09 MED ORDER — LANSOPRAZOLE 30 MG PO CPDR: 30.0000 mg | DELAYED_RELEASE_CAPSULE | Freq: Two times a day (BID) | ORAL | Status: DC

## 2014-06-09 NOTE — Telephone Encounter (Signed)
Spoke with pt and she is aware and will try the lansoprazole. Script called to pharmacy.

## 2014-06-16 ENCOUNTER — Ambulatory Visit: Payer: Medicaid Other | Admitting: Family Medicine

## 2014-06-17 ENCOUNTER — Ambulatory Visit (INDEPENDENT_AMBULATORY_CARE_PROVIDER_SITE_OTHER): Payer: Medicaid Other | Admitting: Family Medicine

## 2014-06-17 ENCOUNTER — Encounter: Payer: Self-pay | Admitting: Family Medicine

## 2014-06-17 VITALS — BP 97/75 | HR 81 | Temp 98.2°F | Ht 63.0 in | Wt 186.2 lb

## 2014-06-17 DIAGNOSIS — R300 Dysuria: Secondary | ICD-10-CM | POA: Diagnosis not present

## 2014-06-17 DIAGNOSIS — M79605 Pain in left leg: Secondary | ICD-10-CM

## 2014-06-17 DIAGNOSIS — R399 Unspecified symptoms and signs involving the genitourinary system: Secondary | ICD-10-CM

## 2014-06-17 DIAGNOSIS — M5442 Lumbago with sciatica, left side: Secondary | ICD-10-CM

## 2014-06-17 LAB — POCT URINALYSIS DIPSTICK
Bilirubin, UA: NEGATIVE
Glucose, UA: NEGATIVE
Ketones, UA: NEGATIVE
Leukocytes, UA: NEGATIVE
Nitrite, UA: NEGATIVE
Spec Grav, UA: >=1.03
Urobilinogen, UA: 0.2
pH, UA: 6

## 2014-06-17 LAB — POCT UA - MICROSCOPIC ONLY

## 2014-06-17 MED ORDER — TRAMADOL HCL 50 MG PO TABS: 50.0000 mg | ORAL_TABLET | Freq: Three times a day (TID) | ORAL | Status: DC | PRN

## 2014-06-17 NOTE — Assessment & Plan Note (Signed)
Unclear etiology. States acute onset with UTI symptoms (which are resolving). No history of trauma or back pain. Describes neuropathic type pain in L4/L5 distribution. She does have one sided weakness on that leg but it is all muscle groups and difficult to discern if it is just limited by pain. DDx: lumbar radiculopathy, bulging disc, peripheral neuropathy (noted to have prediabetes at visit in February), less favoring MSK etiology. Will get plain films of lumbar spine, treat breakthrough pain with tramadol, and given return precautions / follow up in 1-2 weeks if not improving and may need to consider MRI.

## 2014-06-17 NOTE — Patient Instructions (Signed)
Get the x-rays of the low back; this will help if the leg pain does not get better and if we need to do more imaging.  Take the tramadol only as needed for severe pain. Use tylenol, aleve, or ibuprofen for most of the time.

## 2014-06-17 NOTE — Assessment & Plan Note (Signed)
Improving/resolved. Pt was given amoxicillin for dental procedure. UA is negative here. F/u as needed.

## 2014-06-17 NOTE — Progress Notes (Signed)
I was the preceptor for this visit. 

## 2014-06-17 NOTE — Progress Notes (Signed)
   Subjective:    Patient ID: Christina Allen, female    DOB: 1976/01/23, 39 y.o.   MRN: [id number]  HPI  Patient presents for Same Day Appointment  CC: leg pain  # UTI:  Started 6 days ago  Increased frequency and pain with urination  Has upcoming dental procedure so was given amoxicillin and has been taking that, says symptoms have gotten better and mostly gone now ROS: no fevers or chills, no nausea or vomiting  # Left Leg pain  Started with UTI symptoms  Started with bottom of heel, then whole leg. Located over outside and top of foot, mostly front of the leg as it radiates up and does go into her buttocks  Feels like fire/burning and in her ankle feels more achy  Tried: ibuprofen, tylenol  Denies any trauma to leg or back, no issues in the past, did not feel any pops or noise  Had tdap and hep b vaccine in beginning of March ROS: no bowel or bladder incontinence  Review of Systems   See HPI for ROS. All other systems reviewed and are negative.  Past medical history, surgical, family, and social history reviewed and updated in the EMR as appropriate.  Objective:  BP 97/75 mmHg  Pulse 81  Temp(Src) 98.2 F (36.8 C) (Oral)  Ht 5\' 3"  (1.6 m)  Wt 186 lb 4 oz (84.482 kg)  BMI 33.00 kg/m2 Vitals and nursing note reviewed  General: NAD Back: mildly tender low back lateral to spine, worse on left. No spinal tenderness, no step off deformities Ext: no edema or cyanosis. No deformity noted left ankle, no swelling, minimal/no tenderness to palpation, ROM intact. Neuro: alert and oriented. Strength 5/5 on right side: hip flexion, leg extension/flexion, ankle flexion/extension. Strength 4/5 on left side: hip flexion, leg extension/flexion, ankle flexion/extension (pt states limited by both pain and feeling weak). 2+ patellar and achilles reflexes bilaterally. Antalgic gait. SLR testing negative bilaterally (left caused ankle pain at almost 90 degrees)  Assessment & Plan:  See  Problem List Documentation

## 2014-06-29 ENCOUNTER — Emergency Department (HOSPITAL_BASED_OUTPATIENT_CLINIC_OR_DEPARTMENT_OTHER): Payer: Medicaid Other

## 2014-06-29 ENCOUNTER — Emergency Department (HOSPITAL_BASED_OUTPATIENT_CLINIC_OR_DEPARTMENT_OTHER)
Admission: EM | Admit: 2014-06-29 | Discharge: 2014-06-29 | Disposition: A | Discharge status: Home / Self Care | Payer: Medicaid Other | Attending: Emergency Medicine | Admitting: Emergency Medicine

## 2014-06-29 ENCOUNTER — Encounter (HOSPITAL_BASED_OUTPATIENT_CLINIC_OR_DEPARTMENT_OTHER): Payer: Self-pay | Admitting: *Deleted

## 2014-06-29 DIAGNOSIS — M542 Cervicalgia: Secondary | ICD-10-CM

## 2014-06-29 DIAGNOSIS — Z8619 Personal history of other infectious and parasitic diseases: Secondary | ICD-10-CM | POA: Insufficient documentation

## 2014-06-29 DIAGNOSIS — Z87448 Personal history of other diseases of urinary system: Secondary | ICD-10-CM | POA: Insufficient documentation

## 2014-06-29 DIAGNOSIS — Z872 Personal history of diseases of the skin and subcutaneous tissue: Secondary | ICD-10-CM | POA: Diagnosis not present

## 2014-06-29 DIAGNOSIS — Z79899 Other long term (current) drug therapy: Secondary | ICD-10-CM | POA: Insufficient documentation

## 2014-06-29 DIAGNOSIS — R0602 Shortness of breath: Secondary | ICD-10-CM | POA: Insufficient documentation

## 2014-06-29 DIAGNOSIS — K219 Gastro-esophageal reflux disease without esophagitis: Secondary | ICD-10-CM | POA: Diagnosis not present

## 2014-06-29 DIAGNOSIS — R079 Chest pain, unspecified: Secondary | ICD-10-CM | POA: Diagnosis not present

## 2014-06-29 DIAGNOSIS — Z792 Long term (current) use of antibiotics: Secondary | ICD-10-CM | POA: Diagnosis not present

## 2014-06-29 DIAGNOSIS — Z87891 Personal history of nicotine dependence: Secondary | ICD-10-CM | POA: Insufficient documentation

## 2014-06-29 DIAGNOSIS — N644 Mastodynia: Secondary | ICD-10-CM | POA: Diagnosis not present

## 2014-06-29 LAB — D-DIMER, QUANTITATIVE: D-Dimer, Quant: 0.42 ug/mL-FEU (ref 0.00–0.48)

## 2014-06-29 MED ORDER — OXYCODONE-ACETAMINOPHEN 5-325 MG PO TABS: 1.0000 | ORAL_TABLET | Freq: Four times a day (QID) | ORAL | Status: DC | PRN

## 2014-06-29 MED ORDER — CYCLOBENZAPRINE HCL 10 MG PO TABS
5.0000 mg | ORAL_TABLET | Freq: Once | ORAL | Status: AC
  Administered 2014-06-29: 5 mg via ORAL
  Filled 2014-06-29: qty 1

## 2014-06-29 MED ORDER — CYCLOBENZAPRINE HCL 10 MG PO TABS: 5.0000 mg | ORAL_TABLET | Freq: Once | ORAL | Status: DC

## 2014-06-29 MED ORDER — CYCLOBENZAPRINE HCL 5 MG PO TABS: 5.0000 mg | ORAL_TABLET | Freq: Three times a day (TID) | ORAL | Status: DC | PRN

## 2014-06-29 MED ORDER — OXYCODONE-ACETAMINOPHEN 5-325 MG PO TABS
1.0000 | ORAL_TABLET | Freq: Once | ORAL | Status: AC
  Administered 2014-06-29: 1 via ORAL
  Filled 2014-06-29: qty 1

## 2014-06-29 NOTE — Discharge Instructions (Signed)

## 2014-06-29 NOTE — ED Notes (Signed)
Pt denies any complaints at this time states pain "gone" Alertx4 respirations easy non labored.

## 2014-06-29 NOTE — ED Notes (Signed)
Neck pain into her back for 3 days. Sharp pain in her left breast.

## 2014-06-29 NOTE — ED Provider Notes (Signed)
CSN: [id number]     Arrival date & time 06/29/14  1516 History   First MD Initiated Contact with Patient 06/29/14 1554     Chief Complaint  Patient presents with  . Neck Pain     (Consider location/radiation/quality/duration/timing/severity/associated sxs/prior Treatment) Patient is a 39 y.o. female presenting with neck pain. The history is provided by the patient.  Neck Pain Associated symptoms: chest pain   Associated symptoms: no headaches, no numbness and no weakness    patient's had pain in her neck that she states radiates down her back. Began around a week to 3 days ago. Is constant and is worse with certain movements. No fevers no cough states she does have some nasal drainage. No history of neck problems. No trauma. She states she also some pain in her left breast in the chest. States her left breast is tender. No trauma. States she has an implant her arm so she doesn't get periods. She states she's also having increasing shortness of breath. Worse with exertion. No fevers. No cough. She has some dull chest tenderness with it. She was seen by her PCP 2 weeks ago for pain in her left leg was thought to be due to neuropathy. She states that pain is improved.   Past Medical History  Diagnosis Date  . GERD (gastroesophageal reflux disease)   . H/O varicella   . Trichomonas   . Leukorrhea 04/08/2007    Physiologic  . Abnormal Pap smear 2004  . Yeast infection   . Bacterial infection   . Preterm labor   . Endometriosis   . Pemphigus vulgaris   . Anal fissure   . Dysmenorrhea   . Menorrhagia   . Vaginitis   . Allergy    Past Surgical History  Procedure Laterality Date  . Other surgical history      tubal ligation and c-section  . Wisdom tooth extraction    . Cesarean section    . Dilation and curettage of uterus    . Tubal ligation     Family History  Problem Relation Age of Onset  . Hypertension Mother   . Kidney disease Mother   . COPD Maternal Grandmother    Emphysema  . Cancer Maternal Grandmother   . Hypertension Father   . Alcohol abuse Father   . Alcohol abuse Sister   . Alcohol abuse Brother   . Cancer Maternal Aunt   . Diabetes Maternal Uncle   . Cancer Paternal Grandmother    History  Substance Use Topics  . Smoking status: Former Smoker    Quit date: 03/06/2003  . Smokeless tobacco: Not on file  . Alcohol Use: No   OB History    Gravida Para Term Preterm AB TAB SAB Ectopic Multiple Living   3 3        3      Review of Systems  Constitutional: Negative for activity change and appetite change.  Eyes: Negative for pain.  Respiratory: Positive for shortness of breath. Negative for chest tightness.   Cardiovascular: Positive for chest pain. Negative for leg swelling.  Gastrointestinal: Negative for nausea, vomiting, abdominal pain and diarrhea.  Genitourinary: Negative for flank pain.  Musculoskeletal: Positive for neck pain. Negative for back pain and neck stiffness.  Skin: Negative for rash.  Neurological: Negative for weakness, numbness and headaches.  Psychiatric/Behavioral: Negative for behavioral problems.      Allergies  Shellfish-derived products  Home Medications   Prior to Admission medications   Medication Sig  Start Date End Date Taking? Authorizing Provider  amoxicillin-clavulanate (AUGMENTIN) 875-125 MG per tablet Take 1 tablet by mouth 2 (two) times daily. 06/02/14   Patrecia Pour, MD  cyclobenzaprine (FLEXERIL) 5 MG tablet Take 1 tablet (5 mg total) by mouth 3 (three) times daily as needed for muscle spasms. 06/29/14   Davonna Belling, MD  lansoprazole (PREVACID) 30 MG capsule Take 1 capsule (30 mg total) by mouth 2 (two) times daily before a meal. 06/09/14   Jerene Bears, MD  Linaclotide (LINZESS) 145 MCG CAPS capsule Take 1 capsule (145 mcg total) by mouth daily. Take 30 minutes before first meal of the day 02/16/14   Jerene Bears, MD  loratadine (CLARITIN) 10 MG tablet Take 1 tablet (10 mg total) by mouth  daily. 04/30/14   Patrecia Pour, MD  lubiprostone (AMITIZA) 8 MCG capsule Take 1 capsule (8 mcg total) by mouth 2 (two) times daily with a meal. 03/15/14   Jerene Bears, MD  montelukast (SINGULAIR) 10 MG tablet  01/17/14   Historical Provider, MD  Multiple Vitamins-Minerals (MULTIVITAMIN WITH MINERALS) tablet Take 1 tablet by mouth daily.     Historical Provider, MD  NASONEX 50 MCG/ACT nasal spray USE 2 SPRAYS IN EACH NOSTRIL DAILY 05/19/14   Patrecia Pour, MD  Olopatadine HCl 0.2 % SOLN Apply 1 drop to eye daily. 05/18/14   Timmothy Euler, MD  ondansetron (ZOFRAN) 4 MG tablet Take 1 tablet (4 mg total) by mouth every 8 (eight) hours as needed for nausea or vomiting. 05/18/14   Timmothy Euler, MD  oxyCODONE-acetaminophen (PERCOCET/ROXICET) 5-325 MG per tablet Take 1-2 tablets by mouth every 6 (six) hours as needed for severe pain. 06/29/14   Davonna Belling, MD  pantoprazole (PROTONIX) 40 MG tablet Take 1 tablet (40 mg total) by mouth daily. 05/13/14   Jerene Bears, MD  traMADol (ULTRAM) 50 MG tablet Take 1 tablet (50 mg total) by mouth every 8 (eight) hours as needed for severe pain. 06/17/14   Leone Brand, MD  Vitamin D, Ergocalciferol, (DRISDOL) 50000 UNITS CAPS capsule Take 1 capsule (50,000 Units total) by mouth every 7 (seven) days. 05/05/14   Patrecia Pour, MD   BP 116/77 mmHg  Pulse 91  Temp(Src) 97.9 F (36.6 C) (Oral)  Resp 17  Ht 5\' 2"  (1.575 m)  Wt 186 lb (84.369 kg)  BMI 34.01 kg/m2  SpO2 97% Physical Exam  Constitutional: She is oriented to person, place, and time. She appears well-developed and well-nourished.  HENT:  Head: Normocephalic and atraumatic.  Eyes: EOM are normal. Pupils are equal, round, and reactive to light.  Neck: Neck supple.  Mildly decreased range of motion. Some tenderness of her muscles, worse on the right side. No masses. No step-off or deformity.  Cardiovascular: Normal rate, regular rhythm and normal heart sounds.   No murmur heard. Pulmonary/Chest:  Effort normal and breath sounds normal. No respiratory distress. She has no wheezes. She has no rales. She exhibits tenderness.  Tenderness over left breast. No skin changes. No mass.  Abdominal: Soft. Bowel sounds are normal. She exhibits no distension. There is no tenderness. There is no rebound and no guarding.  Musculoskeletal: Normal range of motion.  Neurological: She is alert and oriented to person, place, and time. No cranial nerve deficit.  Skin: Skin is warm and dry.  Psychiatric: She has a normal mood and affect. Her speech is normal.  Nursing note and vitals reviewed.   ED  Course  Procedures (including critical care time) Labs Review Labs Reviewed  D-DIMER, QUANTITATIVE    Imaging Review Dg Chest 2 View  06/29/2014   CLINICAL DATA:  Left-sided chest pain for 1 day  EXAM: CHEST  2 VIEW  COMPARISON:  May 19, 2014  FINDINGS: Lungs are clear. Heart size and pulmonary vascularity are normal. No adenopathy. No pneumothorax. No bone lesions.  IMPRESSION: No edema or consolidation.   Electronically Signed   By: Lowella Grip III M.D.   On: 06/29/2014 17:01     EKG Interpretation   Date/Time:  Tuesday June 29 2014 15:34:57 EDT Ventricular Rate:  112 PR Interval:  130 QRS Duration: 82 QT Interval:  344 QTC Calculation: 469 R Axis:   -34 Text Interpretation:  Sinus tachycardia Left axis deviation Anterior  infarct , age undetermined Abnormal ECG Confirmed by Alvino Chapel  MD, Requan Hardge  [id number]) on 06/29/2014 4:03:43 PM      MDM   Final diagnoses:  Neck pain    Patient with neck pain. Likely musculoskeletal. There is muscular tenderness. Also had some chest pain shortness of breath. D-dimer done due to recent leg pain. Is negative. Reassuring x-ray. Feels better after treatment will be discharged home    Davonna Belling, MD 06/29/14 1944

## 2014-07-27 ENCOUNTER — Emergency Department (HOSPITAL_COMMUNITY)
Admission: EM | Admit: 2014-07-27 | Discharge: 2014-07-27 | Disposition: A | Discharge status: Home / Self Care | Payer: Medicaid Other | Attending: Emergency Medicine | Admitting: Emergency Medicine

## 2014-07-27 ENCOUNTER — Encounter (HOSPITAL_COMMUNITY): Payer: Self-pay | Admitting: Cardiology

## 2014-07-27 ENCOUNTER — Emergency Department (HOSPITAL_COMMUNITY): Payer: Medicaid Other

## 2014-07-27 DIAGNOSIS — K219 Gastro-esophageal reflux disease without esophagitis: Secondary | ICD-10-CM | POA: Diagnosis not present

## 2014-07-27 DIAGNOSIS — Z3202 Encounter for pregnancy test, result negative: Secondary | ICD-10-CM | POA: Diagnosis not present

## 2014-07-27 DIAGNOSIS — Z87891 Personal history of nicotine dependence: Secondary | ICD-10-CM | POA: Insufficient documentation

## 2014-07-27 DIAGNOSIS — Z9889 Other specified postprocedural states: Secondary | ICD-10-CM | POA: Diagnosis not present

## 2014-07-27 DIAGNOSIS — Z792 Long term (current) use of antibiotics: Secondary | ICD-10-CM | POA: Diagnosis not present

## 2014-07-27 DIAGNOSIS — R112 Nausea with vomiting, unspecified: Secondary | ICD-10-CM | POA: Diagnosis not present

## 2014-07-27 DIAGNOSIS — R1033 Periumbilical pain: Secondary | ICD-10-CM

## 2014-07-27 DIAGNOSIS — Z79899 Other long term (current) drug therapy: Secondary | ICD-10-CM | POA: Diagnosis not present

## 2014-07-27 DIAGNOSIS — Z8619 Personal history of other infectious and parasitic diseases: Secondary | ICD-10-CM | POA: Insufficient documentation

## 2014-07-27 DIAGNOSIS — R1030 Lower abdominal pain, unspecified: Secondary | ICD-10-CM | POA: Diagnosis present

## 2014-07-27 DIAGNOSIS — Z9851 Tubal ligation status: Secondary | ICD-10-CM | POA: Insufficient documentation

## 2014-07-27 DIAGNOSIS — Z8742 Personal history of other diseases of the female genital tract: Secondary | ICD-10-CM | POA: Insufficient documentation

## 2014-07-27 LAB — CBC WITH DIFFERENTIAL/PLATELET
Basophils Absolute: 0 10*3/uL (ref 0.0–0.1)
Basophils Relative: 0 % (ref 0–1)
Eosinophils Absolute: 0.1 10*3/uL (ref 0.0–0.7)
Eosinophils Relative: 1 % (ref 0–5)
HCT: 38.3 % (ref 36.0–46.0)
Hemoglobin: 13.2 g/dL (ref 12.0–15.0)
Lymphocytes Relative: 23 % (ref 12–46)
Lymphs Abs: 1.3 10*3/uL (ref 0.7–4.0)
MCH: 30.8 pg (ref 26.0–34.0)
MCHC: 34.5 g/dL (ref 30.0–36.0)
MCV: 89.3 fL (ref 78.0–100.0)
Monocytes Absolute: 0.4 10*3/uL (ref 0.1–1.0)
Monocytes Relative: 7 % (ref 3–12)
Neutro Abs: 3.9 10*3/uL (ref 1.7–7.7)
Neutrophils Relative %: 69 % (ref 43–77)
Platelets: 389 10*3/uL (ref 150–400)
RBC: 4.29 MIL/uL (ref 3.87–5.11)
RDW: 12.6 % (ref 11.5–15.5)
WBC: 5.6 10*3/uL (ref 4.0–10.5)

## 2014-07-27 LAB — COMPREHENSIVE METABOLIC PANEL
ALT: 20 U/L (ref 14–54)
AST: 18 U/L (ref 15–41)
Albumin: 3.6 g/dL (ref 3.5–5.0)
Alkaline Phosphatase: 74 U/L (ref 38–126)
Anion gap: 8 (ref 5–15)
BUN: 9 mg/dL (ref 6–20)
CO2: 21 mmol/L — ABNORMAL LOW (ref 22–32)
Calcium: 8.6 mg/dL — ABNORMAL LOW (ref 8.9–10.3)
Chloride: 107 mmol/L (ref 101–111)
Creatinine, Ser: 0.85 mg/dL (ref 0.44–1.00)
GFR calc Af Amer: >60 mL/min (ref >60)
GFR calc non Af Amer: >60 mL/min (ref >60)
Glucose, Bld: 98 mg/dL (ref 65–99)
Potassium: 3.7 mmol/L (ref 3.5–5.1)
Sodium: 136 mmol/L (ref 135–145)
Total Bilirubin: 0.7 mg/dL (ref 0.3–1.2)
Total Protein: 6.5 g/dL (ref 6.5–8.1)

## 2014-07-27 LAB — URINE MICROSCOPIC-ADD ON

## 2014-07-27 LAB — URINALYSIS, ROUTINE W REFLEX MICROSCOPIC
Bilirubin Urine: NEGATIVE
Glucose, UA: NEGATIVE mg/dL
Ketones, ur: NEGATIVE mg/dL
Leukocytes, UA: NEGATIVE
Nitrite: NEGATIVE
Protein, ur: NEGATIVE mg/dL
Specific Gravity, Urine: 1.016 (ref 1.005–1.030)
Urobilinogen, UA: 0.2 mg/dL (ref 0.0–1.0)
pH: 6 (ref 5.0–8.0)

## 2014-07-27 LAB — I-STAT BETA HCG BLOOD, ED (MC, WL, AP ONLY): I-stat hCG, quantitative: <5 m[IU]/mL (ref <5)

## 2014-07-27 LAB — WET PREP, GENITAL
Clue Cells Wet Prep HPF POC: NONE SEEN
Trich, Wet Prep: NONE SEEN
Yeast Wet Prep HPF POC: NONE SEEN

## 2014-07-27 LAB — LIPASE, BLOOD: Lipase: 17 U/L — ABNORMAL LOW (ref 22–51)

## 2014-07-27 MED ORDER — MORPHINE SULFATE 4 MG/ML IJ SOLN
4.0000 mg | Freq: Once | INTRAMUSCULAR | Status: AC
  Administered 2014-07-27: 4 mg via INTRAVENOUS
  Filled 2014-07-27: qty 1

## 2014-07-27 MED ORDER — ONDANSETRON HCL 4 MG/2ML IJ SOLN
4.0000 mg | Freq: Once | INTRAMUSCULAR | Status: AC
  Administered 2014-07-27: 4 mg via INTRAVENOUS
  Filled 2014-07-27: qty 2

## 2014-07-27 MED ORDER — GI COCKTAIL ~~LOC~~
30.0000 mL | Freq: Once | ORAL | Status: AC
  Administered 2014-07-27: 30 mL via ORAL
  Filled 2014-07-27: qty 30

## 2014-07-27 MED ORDER — IOHEXOL 300 MG/ML  SOLN
100.0000 mL | Freq: Once | INTRAMUSCULAR | Status: AC | PRN
  Administered 2014-07-27: 100 mL via INTRAVENOUS

## 2014-07-27 MED ORDER — IOHEXOL 300 MG/ML  SOLN
25.0000 mL | Freq: Once | INTRAMUSCULAR | Status: AC | PRN
  Administered 2014-07-27: 25 mL via ORAL

## 2014-07-27 MED ORDER — DICYCLOMINE HCL 20 MG PO TABS: 20.0000 mg | ORAL_TABLET | Freq: Two times a day (BID) | ORAL | Status: DC

## 2014-07-27 MED ORDER — SODIUM CHLORIDE 0.9 % IV BOLUS (SEPSIS)
1000.0000 mL | Freq: Once | INTRAVENOUS | Status: AC
  Administered 2014-07-27: 1000 mL via INTRAVENOUS

## 2014-07-27 MED ORDER — ONDANSETRON 4 MG PO TBDP: 4.0000 mg | ORAL_TABLET | Freq: Three times a day (TID) | ORAL | Status: DC | PRN

## 2014-07-27 MED ORDER — ONDANSETRON HCL 4 MG/2ML IJ SOLN: 4.0000 mg | Freq: Once | INTRAMUSCULAR | Status: DC

## 2014-07-27 MED ORDER — DIPHENHYDRAMINE HCL 50 MG/ML IJ SOLN
25.0000 mg | Freq: Once | INTRAMUSCULAR | Status: AC
  Administered 2014-07-27: 25 mg via INTRAVENOUS
  Filled 2014-07-27: qty 1

## 2014-07-27 NOTE — ED Notes (Signed)
Pt reports severe lower abd pain that started this morning. Reports some n/v. Pt is tearful at triage. Reports hx of c-section but no other surgeries. Denies any urinary or bowel symptoms.

## 2014-07-27 NOTE — ED Notes (Signed)
Asked patient to get an urine sample she stated that it hurts too bad to get up

## 2014-07-27 NOTE — ED Notes (Signed)
Asked patient to get an urine sample pt still stats she can not get one now

## 2014-07-27 NOTE — ED Provider Notes (Signed)
CSN: [id number]     Arrival date & time 07/27/14  0815 History   First MD Initiated Contact with Patient 07/27/14 0830     Chief Complaint  Patient presents with  . Abdominal Pain   Christina Allen is a 39 y.o. female with a history of GERD, endometriosis, dysmenorrhea, irritable bowel syndrome, and a tubal ligation who presents to the emergency department complaining of mid and lower abdominal pain ongoing for the past 3 hours. Patient reports onset of abdominal pain around 6 AM this morning and it is in the middle and across her low abdomen. She denies it being worse on either side. She is unable to identify aggravating factors but reports that her pain is improved Sitting in the fetal position. She rates her pain at a 10 out of 10 and describes it as sharp. She reports nausea and has vomited 3 times today. She denies any diarrhea or hematemesis. She reports a subjective fever today. She reports her last bowel movement was this morning and before her pain started. She last ate last night. She reports she is on Implanon for birth control. She does not have regular menstrual cycles. She has a history of a previous tubal ligation, cesarean section and D&C. She denies other previous abdominal surgeries. The patient denies hematemesis, hematochezia, diarrhea, urinary symptoms, vaginal bleeding, vaginal discharge, pelvic pain, or rashes.   (Consider location/radiation/quality/duration/timing/severity/associated sxs/prior Treatment) HPI  Past Medical History  Diagnosis Date  . GERD (gastroesophageal reflux disease)   . H/O varicella   . Trichomonas   . Leukorrhea 04/08/2007    Physiologic  . Abnormal Pap smear 2004  . Yeast infection   . Bacterial infection   . Preterm labor   . Endometriosis   . Pemphigus vulgaris   . Anal fissure   . Dysmenorrhea   . Menorrhagia   . Vaginitis   . Allergy    Past Surgical History  Procedure Laterality Date  . Other surgical history      tubal ligation and  c-section  . Wisdom tooth extraction    . Cesarean section    . Dilation and curettage of uterus    . Tubal ligation     Family History  Problem Relation Age of Onset  . Hypertension Mother   . Kidney disease Mother   . COPD Maternal Grandmother     Emphysema  . Cancer Maternal Grandmother   . Hypertension Father   . Alcohol abuse Father   . Alcohol abuse Sister   . Alcohol abuse Brother   . Cancer Maternal Aunt   . Diabetes Maternal Uncle   . Cancer Paternal Grandmother    History  Substance Use Topics  . Smoking status: Former Smoker    Quit date: 03/06/2003  . Smokeless tobacco: Not on file  . Alcohol Use: No   OB History    Gravida Para Term Preterm AB TAB SAB Ectopic Multiple Living   3 3        3      Review of Systems  Constitutional: Negative for fever and chills.  HENT: Negative for congestion and sore throat.   Eyes: Negative for visual disturbance.  Respiratory: Negative for cough, shortness of breath and wheezing.   Cardiovascular: Negative for chest pain and palpitations.  Gastrointestinal: Positive for nausea, vomiting and abdominal pain. Negative for diarrhea and blood in stool.  Genitourinary: Negative for dysuria, urgency, frequency, hematuria, flank pain, vaginal bleeding, vaginal discharge and difficulty urinating.  Musculoskeletal: Negative for  back pain and neck pain.  Skin: Negative for rash.  Neurological: Negative for light-headedness and headaches.      Allergies  Shellfish-derived products  Home Medications   Prior to Admission medications   Medication Sig Start Date End Date Taking? Authorizing Provider  lansoprazole (PREVACID) 30 MG capsule Take 1 capsule (30 mg total) by mouth 2 (two) times daily before a meal. 06/09/14  Yes Jerene Bears, MD  montelukast (SINGULAIR) 10 MG tablet Take 10 mg by mouth at bedtime.  01/17/14  Yes Historical Provider, MD  Multiple Vitamins-Minerals (MULTIVITAMIN WITH MINERALS) tablet Take 1 tablet by mouth  daily.    Yes Historical Provider, MD  NASONEX 50 MCG/ACT nasal spray USE 2 SPRAYS IN EACH NOSTRIL DAILY 05/19/14  Yes Patrecia Pour, MD  Olopatadine HCl 0.2 % SOLN Apply 1 drop to eye daily. 05/18/14  Yes Timmothy Euler, MD  Vitamin D, Ergocalciferol, (DRISDOL) 50000 UNITS CAPS capsule Take 1 capsule (50,000 Units total) by mouth every 7 (seven) days. 05/05/14  Yes Patrecia Pour, MD  amoxicillin-clavulanate (AUGMENTIN) 875-125 MG per tablet Take 1 tablet by mouth 2 (two) times daily. Patient not taking: Reported on 07/27/2014 06/02/14   Patrecia Pour, MD  cyclobenzaprine (FLEXERIL) 5 MG tablet Take 1 tablet (5 mg total) by mouth 3 (three) times daily as needed for muscle spasms. Patient not taking: Reported on 07/27/2014 06/29/14   Davonna Belling, MD  dicyclomine (BENTYL) 20 MG tablet Take 1 tablet (20 mg total) by mouth 2 (two) times daily. 07/27/14   Waynetta Pean, PA-C  Linaclotide (LINZESS) 145 MCG CAPS capsule Take 1 capsule (145 mcg total) by mouth daily. Take 30 minutes before first meal of the day Patient not taking: Reported on 07/27/2014 02/16/14   Jerene Bears, MD  loratadine (CLARITIN) 10 MG tablet Take 1 tablet (10 mg total) by mouth daily. Patient not taking: Reported on 07/27/2014 04/30/14   Patrecia Pour, MD  lubiprostone (AMITIZA) 8 MCG capsule Take 1 capsule (8 mcg total) by mouth 2 (two) times daily with a meal. Patient not taking: Reported on 07/27/2014 03/15/14   Jerene Bears, MD  ondansetron (ZOFRAN ODT) 4 MG disintegrating tablet Take 1 tablet (4 mg total) by mouth every 8 (eight) hours as needed for nausea or vomiting. 07/27/14   Waynetta Pean, PA-C  ondansetron (ZOFRAN) 4 MG tablet Take 1 tablet (4 mg total) by mouth every 8 (eight) hours as needed for nausea or vomiting. Patient not taking: Reported on 07/27/2014 05/18/14   Timmothy Euler, MD  oxyCODONE-acetaminophen (PERCOCET/ROXICET) 5-325 MG per tablet Take 1-2 tablets by mouth every 6 (six) hours as needed for severe  pain. Patient not taking: Reported on 07/27/2014 06/29/14   Davonna Belling, MD  pantoprazole (PROTONIX) 40 MG tablet Take 1 tablet (40 mg total) by mouth daily. Patient not taking: Reported on 07/27/2014 05/13/14   Jerene Bears, MD  traMADol (ULTRAM) 50 MG tablet Take 1 tablet (50 mg total) by mouth every 8 (eight) hours as needed for severe pain. Patient not taking: Reported on 07/27/2014 06/17/14   Leone Brand, MD   BP 107/75 mmHg  Pulse 80  Temp(Src) 98 F (36.7 C) (Oral)  Resp 20  Wt 186 lb (84.369 kg)  SpO2 99%  LMP  Physical Exam  Constitutional: She is oriented to person, place, and time. She appears well-developed and well-nourished. No distress.  Nontoxic appearing. Obese female.  HENT:  Head: Normocephalic and atraumatic.  Mouth/Throat:  Oropharynx is clear and moist. No oropharyngeal exudate.  Eyes: Conjunctivae are normal. Pupils are equal, round, and reactive to light. Right eye exhibits no discharge. Left eye exhibits no discharge.  Neck: Normal range of motion. Neck supple.  Cardiovascular: Normal rate, regular rhythm, normal heart sounds and intact distal pulses.  Exam reveals no gallop and no friction rub.   No murmur heard. Pulmonary/Chest: Effort normal and breath sounds normal. No respiratory distress. She has no wheezes. She has no rales.  Abdominal: Soft. Bowel sounds are normal. She exhibits no distension and no mass. There is tenderness. There is no rebound and no guarding.  Abdomen is soft. Bowel sounds are present. Very mild midabdominal tenderness to palpation. No right lower quadrant tenderness. Negative Murphy sign. Negative Rovsing sign. Negative psoas and obturator sign. No CVA tenderness.  Genitourinary:  Pelvic exam performed by me with female RN chaperone. No external lesions or rashes noted. No vaginal bleeding. Cervix is closed. No cervical motion tenderness. No adnexal tenderness or fullness. Mild amount of vaginal discharge.  Musculoskeletal: She  exhibits no edema.  Lymphadenopathy:    She has no cervical adenopathy.  Neurological: She is alert and oriented to person, place, and time. Coordination normal.  Skin: Skin is warm and dry. No rash noted. She is not diaphoretic. No erythema. No pallor.  Psychiatric: She has a normal mood and affect. Her behavior is normal.  Nursing note and vitals reviewed.   ED Course  Procedures (including critical care time) Labs Review Labs Reviewed  WET PREP, GENITAL - Abnormal; Notable for the following:    WBC, Wet Prep HPF POC FEW (*)    All other components within normal limits  COMPREHENSIVE METABOLIC PANEL - Abnormal; Notable for the following:    CO2 21 (*)    Calcium 8.6 (*)    All other components within normal limits  URINALYSIS, ROUTINE W REFLEX MICROSCOPIC - Abnormal; Notable for the following:    Hgb urine dipstick TRACE (*)    All other components within normal limits  LIPASE, BLOOD - Abnormal; Notable for the following:    Lipase 17 (*)    All other components within normal limits  CBC WITH DIFFERENTIAL/PLATELET  URINE MICROSCOPIC-ADD ON  I-STAT BETA HCG BLOOD, ED ([facility], [facility], [facility] ONLY)  GC/CHLAMYDIA PROBE AMP ([place])    Imaging Review Ct Abdomen Pelvis W Contrast  07/27/2014   CLINICAL DATA:  Severe lower abdominal pain beginning this morning.  EXAM: CT ABDOMEN AND PELVIS WITH CONTRAST  TECHNIQUE: Multidetector CT imaging of the abdomen and pelvis was performed using the standard protocol following bolus administration of intravenous contrast.  CONTRAST:  169mL OMNIPAQUE IOHEXOL 300 MG/ML  SOLN  COMPARISON:  10/01/2013  FINDINGS: Lower chest: Lung bases are clear. No effusions. Heart is normal size.  Hepatobiliary: No biliary ductal dilatation or focal hepatic lesion. Gallbladder grossly unremarkable.  Pancreas: No focal abnormality or ductal dilatation.  Spleen: No focal abnormality.  Normal size.  Adrenals/Urinary Tract: No focal renal or adrenal abnormality. No  hydronephrosis. Urinary bladder is unremarkable.  Stomach/Bowel: Stomach, large and small bowel grossly unremarkable. Appendix is visualized and unremarkable.  Vascular/Lymphatic: No retroperitoneal or mesenteric adenopathy. Aorta normal caliber.  Reproductive: No mass or other significant abnormality.  Other: Trace free fluid in the pelvis.  No free air.  Musculoskeletal: No focal bone lesion or acute bony abnormality.  IMPRESSION: Unremarkable CT of the abdomen and pelvis.  Normal appendix.   Electronically Signed   By: Lennette Bihari  [place] M.D.   On: 07/27/2014 12:18     EKG Interpretation None      Filed Vitals:   07/27/14 1400 07/27/14 1445 07/27/14 1500 07/27/14 1542  BP: 112/69 104/74 107/75   Pulse: 89 73 80   Temp:    98 F (36.7 C)  TempSrc:    Oral  Resp:  20    Weight:      SpO2: 99% 99% 99%      MDM   Meds given in ED:  Medications  ondansetron (ZOFRAN) injection 4 mg (4 mg Intravenous Not Given 07/27/14 1539)  ondansetron (ZOFRAN) injection 4 mg (4 mg Intravenous Given 07/27/14 0932)  sodium chloride 0.9 % bolus 1,000 mL (0 mLs Intravenous Stopped 07/27/14 1217)  morphine 4 MG/ML injection 4 mg (4 mg Intravenous Given 07/27/14 0933)  iohexol (OMNIPAQUE) 300 MG/ML solution 25 mL (25 mLs Oral Contrast Given 07/27/14 1019)  morphine 4 MG/ML injection 4 mg (4 mg Intravenous Given 07/27/14 1214)  ondansetron (ZOFRAN) injection 4 mg (4 mg Intravenous Given 07/27/14 1213)  iohexol (OMNIPAQUE) 300 MG/ML solution 100 mL (100 mLs Intravenous Contrast Given 07/27/14 1147)  gi cocktail (Maalox,Lidocaine,Donnatal) (30 mLs Oral Given 07/27/14 1500)  diphenhydrAMINE (BENADRYL) injection 25 mg (25 mg Intravenous Given 07/27/14 1458)    Discharge Medication List as of 07/27/2014  3:32 PM    START taking these medications   Details  dicyclomine (BENTYL) 20 MG tablet Take 1 tablet (20 mg total) by mouth 2 (two) times daily., Starting 07/27/2014, Until Discontinued, Print    ondansetron (ZOFRAN ODT)  4 MG disintegrating tablet Take 1 tablet (4 mg total) by mouth every 8 (eight) hours as needed for nausea or vomiting., Starting 07/27/2014, Until Discontinued, Print        Final diagnoses:  Non-intractable vomiting with nausea, vomiting of unspecified type  Periumbilical abdominal pain   This  is a 39 y.o. female with a history of GERD, endometriosis, dysmenorrhea, irritable bowel syndrome, and a tubal ligation who presents to the emergency department complaining of mid and lower abdominal pain ongoing for the past 3 hours. Patient reports onset of abdominal pain around 6 AM this morning and it is in the middle and across her low abdomen. It is not worse on one side or the other.  On exam the patient is afebrile and nontoxic appearing. Her abdomen is soft and she has mild peri-umbilical tenderness to palpation. Pelvic exam is unremarkable. Wet prep returned with only few white blood cells. Urinalysis is negative for infection. She is a negative i-STAT hCG. CBC is within normal limits. CMP is unremarkable. Her lipase is 17. CT abdomen and pelvis with contrast was obtained which is unremarkable. Patient had relief of her abdominal pain with pain medicine emergency department. At discharge she is pain-free. She reports feeling ready for discharge. Patient discharged with prescriptions for Zofran and Bentyl. I advised the patient to follow-up with their primary care provider this week. I advised the patient to return to the emergency department with new or worsening symptoms or new concerns. The patient verbalized understanding and agreement with plan.    This patient was discussed with Dr. Venora Maples who agrees with assessment and plan.    Waynetta Pean, PA-C 07/27/14 [place], MD 07/28/14 [phone]

## 2014-07-27 NOTE — Discharge Instructions (Signed)
Abdominal Pain, Women °Abdominal (stomach, pelvic, or belly) pain can be caused by many things. It is important to tell your doctor: °· The location of the pain. °· Does it come and go or is it present all the time? °· Are there things that start the pain (eating certain foods, exercise)? °· Are there other symptoms associated with the pain (fever, nausea, vomiting, diarrhea)? °All of this is helpful to know when trying to find the cause of the pain. °CAUSES  °· Stomach: virus or bacteria infection, or ulcer. °· Intestine: appendicitis (inflamed appendix), regional ileitis (Crohn's disease), ulcerative colitis (inflamed colon), irritable bowel syndrome, diverticulitis (inflamed diverticulum of the colon), or cancer of the stomach or intestine. °· Gallbladder disease or stones in the gallbladder. °· Kidney disease, kidney stones, or infection. °· Pancreas infection or cancer. °· Fibromyalgia (pain disorder). °· Diseases of the female organs: °· Uterus: fibroid (non-cancerous) tumors or infection. °· Fallopian tubes: infection or tubal pregnancy. °· Ovary: cysts or tumors. °· Pelvic adhesions (scar tissue). °· Endometriosis (uterus lining tissue growing in the pelvis and on the pelvic organs). °· Pelvic congestion syndrome (female organs filling up with blood just before the menstrual period). °· Pain with the menstrual period. °· Pain with ovulation (producing an egg). °· Pain with an IUD (intrauterine device, birth control) in the uterus. °· Cancer of the female organs. °· Functional pain (pain not caused by a disease, may improve without treatment). °· Psychological pain. °· Depression. °DIAGNOSIS  °Your doctor will decide the seriousness of your pain by doing an examination. °· Blood tests. °· X-rays. °· Ultrasound. °· CT scan (computed tomography, special type of X-ray). °· MRI (magnetic resonance imaging). °· Cultures, for infection. °· Barium enema (dye inserted in the large intestine, to better view it with  X-rays). °· Colonoscopy (looking in intestine with a lighted tube). °· Laparoscopy (minor surgery, looking in abdomen with a lighted tube). °· Major abdominal exploratory surgery (looking in abdomen with a large incision). °TREATMENT  °The treatment will depend on the cause of the pain.  °· Many cases can be observed and treated at home. °· Over-the-counter medicines recommended by your caregiver. °· Prescription medicine. °· Antibiotics, for infection. °· Birth control pills, for painful periods or for ovulation pain. °· Hormone treatment, for endometriosis. °· Nerve blocking injections. °· Physical therapy. °· Antidepressants. °· Counseling with a psychologist or psychiatrist. °· Minor or major surgery. °HOME CARE INSTRUCTIONS  °· Do not take laxatives, unless directed by your caregiver. °· Take over-the-counter pain medicine only if ordered by your caregiver. Do not take aspirin because it can cause an upset stomach or bleeding. °· Try a clear liquid diet (broth or water) as ordered by your caregiver. Slowly move to a bland diet, as tolerated, if the pain is related to the stomach or intestine. °· Have a thermometer and take your temperature several times a day, and record it. °· Bed rest and sleep, if it helps the pain. °· Avoid sexual intercourse, if it causes pain. °· Avoid stressful situations. °· Keep your follow-up appointments and tests, as your caregiver orders. °· If the pain does not go away with medicine or surgery, you may try: °· Acupuncture. °· Relaxation exercises (yoga, meditation). °· Group therapy. °· Counseling. °SEEK MEDICAL CARE IF:  °· You notice certain foods cause stomach pain. °· Your home care treatment is not helping your pain. °· You need stronger pain medicine. °· You want your IUD removed. °· You feel faint or   lightheaded. °· You develop nausea and vomiting. °· You develop a rash. °· You are having side effects or an allergy to your medicine. °SEEK IMMEDIATE MEDICAL CARE IF:  °· Your  pain does not go away or gets worse. °· You have a fever. °· Your pain is felt only in portions of the abdomen. The right side could possibly be appendicitis. The left lower portion of the abdomen could be colitis or diverticulitis. °· You are passing blood in your stools (bright red or black tarry stools, with or without vomiting). °· You have blood in your urine. °· You develop chills, with or without a fever. °· You pass out. °MAKE SURE YOU:  °· Understand these instructions. °· Will watch your condition. °· Will get help right away if you are not doing well or get worse. °Document Released: 12/17/2006 Document Revised: 07/06/2013 Document Reviewed: 01/06/2009 °[employer]® Patient Information ©2015 [employer]. This information is not intended to replace advice given to you by your health care provider. Make sure you discuss any questions you have with your health care provider. ° °Nausea and Vomiting °Nausea is a sick feeling that often comes before throwing up (vomiting). Vomiting is a reflex where stomach contents come out of your mouth. Vomiting can cause severe loss of body fluids (dehydration). Children and elderly adults can become dehydrated quickly, especially if they also have diarrhea. Nausea and vomiting are symptoms of a condition or disease. It is important to find the cause of your symptoms. °CAUSES  °· Direct irritation of the stomach lining. This irritation can result from increased acid production (gastroesophageal reflux disease), infection, food poisoning, taking certain medicines (such as nonsteroidal anti-inflammatory drugs), alcohol use, or tobacco use. °· Signals from the brain. These signals could be caused by a headache, heat exposure, an inner ear disturbance, increased pressure in the brain from injury, infection, a tumor, or a concussion, pain, emotional stimulus, or metabolic problems. °· An obstruction in the gastrointestinal tract (bowel obstruction). °· Illnesses such as diabetes,  hepatitis, gallbladder problems, appendicitis, kidney problems, cancer, sepsis, atypical symptoms of a heart attack, or eating disorders. °· Medical treatments such as chemotherapy and radiation. °· Receiving medicine that makes you sleep (general anesthetic) during surgery. °DIAGNOSIS °Your caregiver may ask for tests to be done if the problems do not improve after a few days. Tests may also be done if symptoms are severe or if the reason for the nausea and vomiting is not clear. Tests may include: °· Urine tests. °· Blood tests. °· Stool tests. °· Cultures (to look for evidence of infection). °· X-rays or other imaging studies. °Test results can help your caregiver make decisions about treatment or the need for additional tests. °TREATMENT °You need to stay well hydrated. Drink frequently but in small amounts. You may wish to drink water, sports drinks, clear broth, or eat frozen ice pops or gelatin dessert to help stay hydrated. When you eat, eating slowly may help prevent nausea. There are also some antinausea medicines that may help prevent nausea. °HOME CARE INSTRUCTIONS  °· Take all medicine as directed by your caregiver. °· If you do not have an appetite, do not force yourself to eat. However, you must continue to drink fluids. °· If you have an appetite, eat a normal diet unless your caregiver tells you differently. °¨ Eat a variety of complex carbohydrates (rice, wheat, potatoes, bread), lean meats, yogurt, fruits, and vegetables. °¨ Avoid high-fat foods because they are more difficult to digest. °· Drink enough water and fluids   to keep your urine clear or pale yellow. °· If you are dehydrated, ask your caregiver for specific rehydration instructions. Signs of dehydration may include: °¨ Severe thirst. °¨ Dry lips and mouth. °¨ Dizziness. °¨ Dark urine. °¨ Decreasing urine frequency and amount. °¨ Confusion. °¨ Rapid breathing or pulse. °SEEK IMMEDIATE MEDICAL CARE IF:  °· You have blood or brown flecks  (like coffee grounds) in your vomit. °· You have black or bloody stools. °· You have a severe headache or stiff neck. °· You are confused. °· You have severe abdominal pain. °· You have chest pain or trouble breathing. °· You do not urinate at least once every 8 hours. °· You develop cold or clammy skin. °· You continue to vomit for longer than 24 to 48 hours. °· You have a fever. °MAKE SURE YOU:  °· Understand these instructions. °· Will watch your condition. °· Will get help right away if you are not doing well or get worse. °Document Released: 02/19/2005 Document Revised: 05/14/2011 Document Reviewed: 07/19/2010 °[employer]® Patient Information ©2015 [employer]. This information is not intended to replace advice given to you by your health care provider. Make sure you discuss any questions you have with your health care provider. ° °

## 2014-07-28 LAB — GC/CHLAMYDIA PROBE AMP (~~LOC~~) NOT AT ARMC
Chlamydia: NEGATIVE
Neisseria Gonorrhea: NEGATIVE

## 2014-08-02 ENCOUNTER — Emergency Department (HOSPITAL_COMMUNITY)
Admission: EM | Admit: 2014-08-02 | Discharge: 2014-08-02 | Disposition: A | Discharge status: Home / Self Care | Payer: Medicaid Other | Attending: Emergency Medicine | Admitting: Emergency Medicine

## 2014-08-02 ENCOUNTER — Encounter (HOSPITAL_COMMUNITY): Payer: Self-pay

## 2014-08-02 DIAGNOSIS — Z872 Personal history of diseases of the skin and subcutaneous tissue: Secondary | ICD-10-CM | POA: Diagnosis not present

## 2014-08-02 DIAGNOSIS — Z8742 Personal history of other diseases of the female genital tract: Secondary | ICD-10-CM | POA: Diagnosis not present

## 2014-08-02 DIAGNOSIS — Y9289 Other specified places as the place of occurrence of the external cause: Secondary | ICD-10-CM | POA: Diagnosis not present

## 2014-08-02 DIAGNOSIS — Y998 Other external cause status: Secondary | ICD-10-CM | POA: Diagnosis not present

## 2014-08-02 DIAGNOSIS — X58XXXA Exposure to other specified factors, initial encounter: Secondary | ICD-10-CM | POA: Insufficient documentation

## 2014-08-02 DIAGNOSIS — K219 Gastro-esophageal reflux disease without esophagitis: Secondary | ICD-10-CM | POA: Diagnosis not present

## 2014-08-02 DIAGNOSIS — Z87891 Personal history of nicotine dependence: Secondary | ICD-10-CM | POA: Diagnosis not present

## 2014-08-02 DIAGNOSIS — Y9389 Activity, other specified: Secondary | ICD-10-CM | POA: Diagnosis not present

## 2014-08-02 DIAGNOSIS — Z87448 Personal history of other diseases of urinary system: Secondary | ICD-10-CM | POA: Insufficient documentation

## 2014-08-02 DIAGNOSIS — Z8619 Personal history of other infectious and parasitic diseases: Secondary | ICD-10-CM | POA: Diagnosis not present

## 2014-08-02 DIAGNOSIS — Z79899 Other long term (current) drug therapy: Secondary | ICD-10-CM | POA: Diagnosis not present

## 2014-08-02 DIAGNOSIS — T63441A Toxic effect of venom of bees, accidental (unintentional), initial encounter: Secondary | ICD-10-CM | POA: Diagnosis present

## 2014-08-02 MED ORDER — DOXYCYCLINE HYCLATE 100 MG PO CAPS: 100.0000 mg | ORAL_CAPSULE | Freq: Two times a day (BID) | ORAL | Status: DC

## 2014-08-02 MED ORDER — TRAMADOL HCL 50 MG PO TABS
50.0000 mg | ORAL_TABLET | Freq: Once | ORAL | Status: AC
  Administered 2014-08-02: 50 mg via ORAL
  Filled 2014-08-02: qty 1

## 2014-08-02 MED ORDER — TRAMADOL HCL 50 MG PO TABS: 50.0000 mg | ORAL_TABLET | Freq: Four times a day (QID) | ORAL | Status: DC | PRN

## 2014-08-02 MED ORDER — HYDROXYZINE HCL 25 MG PO TABS
25.0000 mg | ORAL_TABLET | Freq: Once | ORAL | Status: AC
  Administered 2014-08-02: 25 mg via ORAL
  Filled 2014-08-02: qty 1

## 2014-08-02 MED ORDER — PREDNISONE 20 MG PO TABS: 40.0000 mg | ORAL_TABLET | Freq: Every day | ORAL | Status: DC

## 2014-08-02 MED ORDER — HYDROXYZINE HCL 10 MG PO TABS: 10.0000 mg | ORAL_TABLET | Freq: Four times a day (QID) | ORAL | Status: DC | PRN

## 2014-08-02 NOTE — ED Provider Notes (Signed)
CSN: [id number]     Arrival date & time 08/02/14  2000 History   None   This chart was scribed for non-physician practitioner, Antonietta Breach, PA, working with Malvin Johns, MD by Terressa Koyanagi, ED Scribe. This patient was seen in room WTR8/WTR8 and the patient's care was started at 9:06 PM.  Chief Complaint  Patient presents with  . Insect Bite   The history is provided by the patient. No language interpreter was used.   PCP: Vance Gather, MD HPI Comments: Christina Allen is a 39 y.o. female, with PMH noted below, who presents to the Emergency Department complaining of swelling, itching, redness and burning pain to her left ankle onset today; pt was stung by a wasp on said area 2 days ago. Pt reports she successfully removed the wasp stinger. Pt has been taking benadryl and ibuprofen at home without relief. Pt's last tetanus vaccine was in the beginning of March 2016. Pt denies numbness, tingling, or any other Sx at this time. Pt denies  allergies to any antibiotics.    Past Medical History  Diagnosis Date  . GERD (gastroesophageal reflux disease)   . H/O varicella   . Trichomonas   . Leukorrhea 04/08/2007    Physiologic  . Abnormal Pap smear 2004  . Yeast infection   . Bacterial infection   . Preterm labor   . Endometriosis   . Pemphigus vulgaris   . Anal fissure   . Dysmenorrhea   . Menorrhagia   . Vaginitis   . Allergy    Past Surgical History  Procedure Laterality Date  . Other surgical history      tubal ligation and c-section  . Wisdom tooth extraction    . Cesarean section    . Dilation and curettage of uterus    . Tubal ligation     Family History  Problem Relation Age of Onset  . Hypertension Mother   . Kidney disease Mother   . COPD Maternal Grandmother     Emphysema  . Cancer Maternal Grandmother   . Hypertension Father   . Alcohol abuse Father   . Alcohol abuse Sister   . Alcohol abuse Brother   . Cancer Maternal Aunt   . Diabetes Maternal Uncle   . Cancer  Paternal Grandmother    History  Substance Use Topics  . Smoking status: Former Smoker    Quit date: 03/06/2003  . Smokeless tobacco: Not on file  . Alcohol Use: No   OB History    Gravida Para Term Preterm AB TAB SAB Ectopic Multiple Living   3 3        3       Review of Systems  Constitutional: Negative for fever and chills.  Musculoskeletal:       Swelling and burning pain to left ankle.   Skin: Positive for color change (redness).       Itching of left ankle.   All other systems reviewed and are negative.   Allergies  Shellfish-derived products  Home Medications   Prior to Admission medications   Medication Sig Start Date End Date Taking? Authorizing Provider  dicyclomine (BENTYL) 20 MG tablet Take 1 tablet (20 mg total) by mouth 2 (two) times daily. Patient taking differently: Take 20 mg by mouth 2 (two) times daily as needed for spasms (spasms).  07/27/14  Yes Waynetta Pean, PA-C  fexofenadine (ALLEGRA) 180 MG tablet Take 180 mg by mouth daily.   Yes Historical Provider, MD  lansoprazole (PREVACID)  30 MG capsule Take 1 capsule (30 mg total) by mouth 2 (two) times daily before a meal. 06/09/14  Yes Jerene Bears, MD  montelukast (SINGULAIR) 10 MG tablet Take 10 mg by mouth at bedtime.  01/17/14  Yes Historical Provider, MD  Multiple Vitamins-Minerals (MULTIVITAMIN WITH MINERALS) tablet Take 1 tablet by mouth daily.    Yes Historical Provider, MD  NASONEX 50 MCG/ACT nasal spray USE 2 SPRAYS IN EACH NOSTRIL DAILY 05/19/14  Yes Patrecia Pour, MD  Olopatadine HCl 0.2 % SOLN Apply 1 drop to eye daily. 05/18/14  Yes Timmothy Euler, MD  ondansetron (ZOFRAN ODT) 4 MG disintegrating tablet Take 1 tablet (4 mg total) by mouth every 8 (eight) hours as needed for nausea or vomiting. 07/27/14  Yes Waynetta Pean, PA-C  Vitamin D, Ergocalciferol, (DRISDOL) 50000 UNITS CAPS capsule Take 1 capsule (50,000 Units total) by mouth every 7 (seven) days. 05/05/14  Yes Patrecia Pour, MD   amoxicillin-clavulanate (AUGMENTIN) 875-125 MG per tablet Take 1 tablet by mouth 2 (two) times daily. Patient not taking: Reported on 07/27/2014 06/02/14   Patrecia Pour, MD  cyclobenzaprine (FLEXERIL) 5 MG tablet Take 1 tablet (5 mg total) by mouth 3 (three) times daily as needed for muscle spasms. Patient not taking: Reported on 07/27/2014 06/29/14   Davonna Belling, MD  doxycycline (VIBRAMYCIN) 100 MG capsule Take 1 capsule (100 mg total) by mouth 2 (two) times daily. 08/02/14   Antonietta Breach, PA-C  hydrOXYzine (ATARAX/VISTARIL) 10 MG tablet Take 1 tablet (10 mg total) by mouth every 6 (six) hours as needed for itching. 08/02/14   Antonietta Breach, PA-C  Linaclotide (LINZESS) 145 MCG CAPS capsule Take 1 capsule (145 mcg total) by mouth daily. Take 30 minutes before first meal of the day Patient not taking: Reported on 07/27/2014 02/16/14   Jerene Bears, MD  loratadine (CLARITIN) 10 MG tablet Take 1 tablet (10 mg total) by mouth daily. Patient not taking: Reported on 07/27/2014 04/30/14   Patrecia Pour, MD  lubiprostone (AMITIZA) 8 MCG capsule Take 1 capsule (8 mcg total) by mouth 2 (two) times daily with a meal. Patient not taking: Reported on 07/27/2014 03/15/14   Jerene Bears, MD  ondansetron (ZOFRAN) 4 MG tablet Take 1 tablet (4 mg total) by mouth every 8 (eight) hours as needed for nausea or vomiting. Patient not taking: Reported on 07/27/2014 05/18/14   Timmothy Euler, MD  oxyCODONE-acetaminophen (PERCOCET/ROXICET) 5-325 MG per tablet Take 1-2 tablets by mouth every 6 (six) hours as needed for severe pain. Patient not taking: Reported on 07/27/2014 06/29/14   Davonna Belling, MD  pantoprazole (PROTONIX) 40 MG tablet Take 1 tablet (40 mg total) by mouth daily. Patient not taking: Reported on 07/27/2014 05/13/14   Jerene Bears, MD  predniSONE (DELTASONE) 20 MG tablet Take 2 tablets (40 mg total) by mouth daily. Take 40 mg by mouth daily for 3 days, then 20mg  by mouth daily for 3 days, then 10mg  daily for 3 days  08/02/14   Antonietta Breach, PA-C  traMADol (ULTRAM) 50 MG tablet Take 1 tablet (50 mg total) by mouth every 6 (six) hours as needed. 08/02/14   Antonietta Breach, PA-C   Triage Vitals: BP 109/78 mmHg  Pulse 95  Temp(Src) 97.8 F (36.6 C) (Oral)  Resp 18  SpO2 100%  Physical Exam  Constitutional: She is oriented to person, place, and time. She appears well-developed and well-nourished. No distress.  Nontoxic/nonseptic appearing  HENT:  Head:  Normocephalic and atraumatic.  Eyes: Conjunctivae and EOM are normal. No scleral icterus.  Neck: Normal range of motion.  Cardiovascular: Normal rate, regular rhythm and intact distal pulses.   Pulmonary/Chest: Effort normal. No respiratory distress.  Musculoskeletal: Normal range of motion.       Left ankle: She exhibits swelling. She exhibits no ecchymosis, no deformity and normal pulse. Tenderness. Achilles tendon normal.       Left lower leg: She exhibits tenderness.       Legs:      Feet:  Erythema to posterior LLE. Mild swelling of soft tissue to L ankle. No pitting edema. Normal ROM of L ankle.  Neurological: She is alert and oriented to person, place, and time. She exhibits normal muscle tone. Coordination normal.  Sensation to light touch intact. Patient ambulatory with steady gait.  Skin: Skin is warm and dry. No rash noted. She is not diaphoretic. There is erythema. No pallor.  Psychiatric: She has a normal mood and affect. Her behavior is normal.  Nursing note and vitals reviewed.   ED Course  Procedures (including critical care time) DIAGNOSTIC STUDIES: Oxygen Saturation is 100% on RA, nl by my interpretation.    COORDINATION OF CARE: 9:10 PM-Discussed treatment plan which includes meds (antibiotics and pain meds) with pt at bedside and pt agreed to plan. Pt also advised to f/u with PCP.   Labs Review Labs Reviewed - No data to display  Imaging Review No results found.   EKG Interpretation None      MDM   Final diagnoses:   Local reaction to bee sting, accidental or unintentional, initial encounter    39 year old female presents to the emergency department for further evaluation of bee sting to posterior left lower extremity/left ankle. Patient with swelling without pitting edema. There is erythema surrounding puncture site. Tetanus up-to-date. Patient neurovascularly intact. Concern for early cellulitis, though suspect that swelling and redness is mostly due to a local reaction to bee sting. Will manage supportively as outpatient. Doxycycline prescribed to cover for infection. Primary care follow-up advised and return precautions given. Patient agreeable to plan with no unaddressed concerns. Patient discharged in good condition.  I personally performed the services described in this documentation, which was scribed in my presence. The recorded information has been reviewed and is accurate.   Filed Vitals:   08/02/14 2005  BP: 109/78  Pulse: 95  Temp: 97.8 F (36.6 C)  TempSrc: Oral  Resp: 18  SpO2: 100%      Antonietta Breach, PA-C 08/02/14 [place], MD 08/02/14 2358

## 2014-08-02 NOTE — Discharge Instructions (Signed)
Take doxycycline as prescribed to prevent infection. Take this antibiotic with a full glass of milk or water. Do not take on an empty stomach. Take Benadryl and Vistaril as prescribed for itching. Take tramadol as needed for pain and ibuprofen for inflammation. The prednisone course your prescribed should also help with inflammation. Follow-up with your primary doctor for a recheck of symptoms. Return to the emergency department as needed if symptoms worsen.  Bee, Wasp, or Hornet Sting Your caregiver has diagnosed you as having an insect sting. An insect sting appears as a red lump in the skin that sometimes has a tiny hole in the center, or it may have a stinger in the center of the wound. The most common stings are from wasps, hornets and bees. Individuals have different reactions to insect stings.  A normal reaction may cause pain, swelling, and redness around the sting site.  A localized allergic reaction may cause swelling and redness that extends beyond the sting site.  A large local reaction may continue to develop over the next 12 to 36 hours.  On occasion, the reactions can be severe (anaphylactic reaction). An anaphylactic reaction may cause wheezing; difficulty breathing; chest pain; fainting; raised, itchy, red patches on the skin; a sick feeling to your stomach (nausea); vomiting; cramping; or diarrhea. If you have had an anaphylactic reaction to an insect sting in the past, you are more likely to have one again. HOME CARE INSTRUCTIONS   With bee stings, a small sac of poison is left in the wound. Brushing across this with something such as a credit card, or anything similar, will help remove this and decrease the amount of the reaction. This same procedure will not help a wasp sting as they do not leave behind a stinger and poison sac.  Apply a cold compress for 10 to 20 minutes every hour for 1 to 2 days, depending on severity, to reduce swelling and itching.  To lessen pain, a  paste made of water and baking soda may be rubbed on the bite or sting and left on for 5 minutes.  To relieve itching and swelling, you may use take medication or apply medicated creams or lotions as directed.  Only take over-the-counter or prescription medicines for pain, discomfort, or fever as directed by your caregiver.  Wash the sting site daily with soap and water. Apply antibiotic ointment on the sting site as directed.  If you suffered a severe reaction:  If you did not require hospitalization, an adult will need to stay with you for 24 hours in case the symptoms return.  You may need to wear a medical bracelet or necklace stating the allergy.  You and your family need to learn when and how to use an anaphylaxis kit or epinephrine injection.  If you have had a severe reaction before, always carry your anaphylaxis kit with you. SEEK MEDICAL CARE IF:   None of the above helps within 2 to 3 days.  The area becomes red, warm, tender, and swollen beyond the area of the bite or sting.  You have an oral temperature above 102 F (38.9 C). SEEK IMMEDIATE MEDICAL CARE IF:  You have symptoms of an allergic reaction which are:  Wheezing.  Difficulty breathing.  Chest pain.  Lightheadedness or fainting.  Itchy, raised, red patches on the skin.  Nausea, vomiting, cramping or diarrhea. ANY OF THESE SYMPTOMS MAY REPRESENT A SERIOUS PROBLEM THAT IS AN EMERGENCY. Do not wait to see if the symptoms will  go away. Get medical help right away. Call your local emergency services (911 in [place] DO NOT drive yourself to the hospital. MAKE SURE YOU:   Understand these instructions.  Will watch your condition.  Will get help right away if you are not doing well or get worse. Document Released: 02/19/2005 Document Revised: 05/14/2011 Document Reviewed: 08/06/2009 [facility] Patient Information 2015 [place], [place]. This information is not intended to replace advice given to you by your  health care provider. Make sure you discuss any questions you have with your health care provider.

## 2014-08-02 NOTE — ED Notes (Signed)
Pt was stung by a wasp two days ago, she says she got the stinger out and this evening it's been swelling and burning

## 2014-08-18 ENCOUNTER — Ambulatory Visit (INDEPENDENT_AMBULATORY_CARE_PROVIDER_SITE_OTHER): Payer: Medicaid Other | Admitting: Family Medicine

## 2014-08-18 VITALS — BP 127/75 | HR 113 | Temp 97.9°F | Ht 65.0 in | Wt 180.3 lb

## 2014-08-18 DIAGNOSIS — Z114 Encounter for screening for human immunodeficiency virus [HIV]: Secondary | ICD-10-CM | POA: Diagnosis not present

## 2014-08-18 DIAGNOSIS — R0600 Dyspnea, unspecified: Secondary | ICD-10-CM

## 2014-08-18 DIAGNOSIS — E559 Vitamin D deficiency, unspecified: Secondary | ICD-10-CM | POA: Diagnosis not present

## 2014-08-18 DIAGNOSIS — R0789 Other chest pain: Secondary | ICD-10-CM | POA: Diagnosis not present

## 2014-08-18 DIAGNOSIS — R946 Abnormal results of thyroid function studies: Secondary | ICD-10-CM

## 2014-08-18 DIAGNOSIS — R000 Tachycardia, unspecified: Secondary | ICD-10-CM

## 2014-08-18 DIAGNOSIS — I471 Supraventricular tachycardia: Secondary | ICD-10-CM | POA: Diagnosis not present

## 2014-08-18 LAB — BASIC METABOLIC PANEL
BUN: 15 mg/dL (ref 6–23)
CO2: 27 mEq/L (ref 19–32)
Calcium: 8.9 mg/dL (ref 8.4–10.5)
Chloride: 105 mEq/L (ref 96–112)
Creat: 0.96 mg/dL (ref 0.50–1.10)
Glucose, Bld: 102 mg/dL — ABNORMAL HIGH (ref 70–99)
Potassium: 3.5 mEq/L (ref 3.5–5.3)
Sodium: 140 mEq/L (ref 135–145)

## 2014-08-18 LAB — T4, FREE: Free T4: 1.27 ng/dL (ref 0.80–1.80)

## 2014-08-18 LAB — CBC
HCT: 39.3 % (ref 36.0–46.0)
Hemoglobin: 13.3 g/dL (ref 12.0–15.0)
MCH: 30.9 pg (ref 26.0–34.0)
MCHC: 33.8 g/dL (ref 30.0–36.0)
MCV: 91.2 fL (ref 78.0–100.0)
MPV: 9.6 fL (ref 8.6–12.4)
Platelets: 403 10*3/uL — ABNORMAL HIGH (ref 150–400)
RBC: 4.31 MIL/uL (ref 3.87–5.11)
RDW: 13.4 % (ref 11.5–15.5)
WBC: 8.7 10*3/uL (ref 4.0–10.5)

## 2014-08-18 LAB — TSH: TSH: 2.56 u[IU]/mL (ref 0.350–4.500)

## 2014-08-18 NOTE — Assessment & Plan Note (Signed)
May be related to tachycardia but otherwise very atypical/non-cardiac by history and reproducible on exam. Recent increased use of albuterol happens annually around this time due to allergies. Don't think ECG is warranted at this time as there is no current symptoms. Will check for anemia, hyperthyroidism; there may be an anxiety and/or musculoskeletal component.

## 2014-08-18 NOTE — Patient Instructions (Signed)
We'll do blood work today to investigate the cause of your symptoms and high heart rate. We will call you if these are abnormal or send you a letter if they're normal.   It was good to see you, take care of yourself and stay away from [place]!  - Dr. Bonner Puna

## 2014-08-18 NOTE — Assessment & Plan Note (Signed)
Recheck today. Will re-supplement vs. Rx daily maintenance as indicated.

## 2014-08-18 NOTE — Progress Notes (Signed)
Subjective: Christina Allen is a 39 y.o. female patient of mine presenting for chest pain.   Christina Allen reports about 1 -2 weeks of intermittent chest discomfort, though it is not present now. It is central in her chest, dull, heavy, moderate intensity, comes and goes unpredictably, present mostly when she is laying down resting, made better by getting up and walking around, lasting less than 1 minute each time. She has shortness of breath for which she takes albuterol which she has needed more recently, about 3 times per week. She has noted intermittent palpitations during this time as well. No new medications or herbal supplements. Denies syncope, lightheadedness, vision changes, HA, orthopnea, PND, LE swelling (except wasp sting); no hair or skin texture changes, though she has had warm intolerance which may be related to summer heat.   She took a 7-day course of doxycycline starting 5/30 for cellulitis related to a wasp sting.  She took 8 weeks of vitamin D replacement beginning in March.   - ROS: As above - former (~ 5 pack year) smoker  Objective: BP 127/75 mmHg  Pulse 113  Temp(Src) 97.9 F (36.6 C) (Oral)  Wt 180 lb 4.8 oz (81.784 kg) Gen: Comfortable, overweight 39 y.o. female in no distress Neck: supple, thyroid enlarged without nodularity or tenderness Pulm: Non-labored on room air; normal rate; CTAB, no wheezes  CV: Tachycardic rate, no murmur, rub or gallop; no LE edema, no JVD. Some reproduction of pain on palpation.    GI: Normoactive BS; soft, NT, ND, no HSM Skin: No rashes, wounds, ulcers; nails and hair normal in appearance  Assessment/Plan: [name] is a 39 y.o. female here for chest discomfort.  See problem list for plan.

## 2014-08-18 NOTE — Assessment & Plan Note (Signed)
Recheck today given palpitations/tachycardia.

## 2014-08-18 NOTE — Progress Notes (Signed)
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   

## 2014-08-19 ENCOUNTER — Encounter: Payer: Self-pay | Admitting: Family Medicine

## 2014-08-19 LAB — VITAMIN D 25 HYDROXY (VIT D DEFICIENCY, FRACTURES): Vit D, 25-Hydroxy: 24 ng/mL — ABNORMAL LOW (ref 30–100)

## 2014-08-24 ENCOUNTER — Telehealth: Payer: Self-pay | Admitting: *Deleted

## 2014-08-24 NOTE — Telephone Encounter (Signed)
Spoke with patient she states he need [place] to fax her Hep B immunizations to her job [phone] Attn: Linda Hedges. Patient scheduled for nurse visit on 6/22 for injection. Will fill out ROI at that time and have immunization record faxed. FYI to RN clinic nurse

## 2014-08-25 ENCOUNTER — Ambulatory Visit (INDEPENDENT_AMBULATORY_CARE_PROVIDER_SITE_OTHER): Payer: Medicaid Other | Admitting: *Deleted

## 2014-08-25 DIAGNOSIS — Z23 Encounter for immunization: Secondary | ICD-10-CM

## 2014-09-01 ENCOUNTER — Emergency Department (HOSPITAL_COMMUNITY): Payer: Medicaid Other

## 2014-09-01 ENCOUNTER — Emergency Department (HOSPITAL_COMMUNITY)
Admission: EM | Admit: 2014-09-01 | Discharge: 2014-09-01 | Disposition: A | Discharge status: Home / Self Care | Payer: Medicaid Other | Attending: Emergency Medicine | Admitting: Emergency Medicine

## 2014-09-01 DIAGNOSIS — Z792 Long term (current) use of antibiotics: Secondary | ICD-10-CM | POA: Insufficient documentation

## 2014-09-01 DIAGNOSIS — Y998 Other external cause status: Secondary | ICD-10-CM | POA: Diagnosis not present

## 2014-09-01 DIAGNOSIS — Z87891 Personal history of nicotine dependence: Secondary | ICD-10-CM | POA: Insufficient documentation

## 2014-09-01 DIAGNOSIS — S6392XA Sprain of unspecified part of left wrist and hand, initial encounter: Secondary | ICD-10-CM | POA: Diagnosis not present

## 2014-09-01 DIAGNOSIS — Z8751 Personal history of pre-term labor: Secondary | ICD-10-CM | POA: Insufficient documentation

## 2014-09-01 DIAGNOSIS — S6992XA Unspecified injury of left wrist, hand and finger(s), initial encounter: Secondary | ICD-10-CM | POA: Diagnosis present

## 2014-09-01 DIAGNOSIS — Z8619 Personal history of other infectious and parasitic diseases: Secondary | ICD-10-CM | POA: Diagnosis not present

## 2014-09-01 DIAGNOSIS — Y9389 Activity, other specified: Secondary | ICD-10-CM | POA: Insufficient documentation

## 2014-09-01 DIAGNOSIS — K219 Gastro-esophageal reflux disease without esophagitis: Secondary | ICD-10-CM | POA: Insufficient documentation

## 2014-09-01 DIAGNOSIS — Y92410 Unspecified street and highway as the place of occurrence of the external cause: Secondary | ICD-10-CM | POA: Insufficient documentation

## 2014-09-01 DIAGNOSIS — Z79899 Other long term (current) drug therapy: Secondary | ICD-10-CM | POA: Insufficient documentation

## 2014-09-01 DIAGNOSIS — Z8742 Personal history of other diseases of the female genital tract: Secondary | ICD-10-CM | POA: Diagnosis not present

## 2014-09-01 MED ORDER — OXYCODONE-ACETAMINOPHEN 5-325 MG PO TABS: 1.0000 | ORAL_TABLET | Freq: Four times a day (QID) | ORAL | Status: DC | PRN

## 2014-09-01 MED ORDER — HYDROCODONE-ACETAMINOPHEN 5-325 MG PO TABS
2.0000 | ORAL_TABLET | Freq: Once | ORAL | Status: DC
  Filled 2014-09-01: qty 2

## 2014-09-01 MED ORDER — OXYCODONE-ACETAMINOPHEN 5-325 MG PO TABS
2.0000 | ORAL_TABLET | Freq: Once | ORAL | Status: AC
  Administered 2014-09-01: 2 via ORAL
  Filled 2014-09-01: qty 2
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
## 2014-09-01 NOTE — ED Notes (Signed)
Questions, concerns r/t dc denied. Pt ambulatory and a&ox4 

## 2014-09-01 NOTE — ED Notes (Signed)
Patient transported to X-ray 
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
## 2014-09-15 ENCOUNTER — Telehealth: Payer: Self-pay | Admitting: Internal Medicine

## 2014-09-15 NOTE — Telephone Encounter (Signed)
Pt called and states that her kidney function is not that good and she has been placed on mycophenolate to treat pemphigus and her dermatologist is concerned about her taking protonix. Pt was told this would make her kidney function worse.  Pt wants to know what else she can take for reflux that is not potentially damaging to her kidneys. Please advise.

## 2014-09-15 NOTE — Telephone Encounter (Signed)
Says she was returning nurses call.

## 2014-09-15 NOTE — Telephone Encounter (Signed)
Okay, all PPIs have some risk in patient with kidney disease  Another option for GERD and dyspepsia would be ranitidine 150 mg twice daily before breakfast and dinner

## 2014-09-15 NOTE — Telephone Encounter (Signed)
Spoke with pt and script sent to pharmacy. 

## 2014-09-16 ENCOUNTER — Telehealth: Payer: Self-pay | Admitting: Internal Medicine

## 2014-09-16 MED ORDER — RANITIDINE HCL 150 MG PO CAPS: 150.0000 mg | ORAL_CAPSULE | Freq: Two times a day (BID) | ORAL | Status: DC

## 2014-09-16 NOTE — Telephone Encounter (Signed)
Script sent to pharmacy, pt aware. 

## 2014-09-20 ENCOUNTER — Ambulatory Visit: Payer: Medicaid Other | Admitting: Family Medicine

## 2014-09-20 ENCOUNTER — Telehealth: Payer: Self-pay | Admitting: Internal Medicine

## 2014-09-20 NOTE — Telephone Encounter (Signed)
Pt called and states that the zantac 150mg  BID is not working. Pt is having heartburn. Please advise.

## 2014-09-20 NOTE — Telephone Encounter (Signed)
Only 2 med classes that are effective for GERD control PPI, which she was concerned about renal function and H2 blocker which is ranitidine Can restart pantoprazole if okay with the MD prescribing her mycophenolate. Pantoprazole can be use once a day with ranitidine 150 in the evening Renal function can be watched serially by primary care or prescribing M.D.

## 2014-09-21 NOTE — Telephone Encounter (Signed)
Left message for pt to call back  °

## 2014-09-21 NOTE — Telephone Encounter (Signed)
Spoke with pt and she is aware.

## 2014-09-23 ENCOUNTER — Emergency Department (HOSPITAL_BASED_OUTPATIENT_CLINIC_OR_DEPARTMENT_OTHER)
Admission: EM | Admit: 2014-09-23 | Discharge: 2014-09-24 | Disposition: A | Discharge status: Home / Self Care | Payer: Medicaid Other | Attending: Emergency Medicine | Admitting: Emergency Medicine

## 2014-09-23 ENCOUNTER — Ambulatory Visit (INDEPENDENT_AMBULATORY_CARE_PROVIDER_SITE_OTHER): Payer: Medicaid Other | Admitting: Family Medicine

## 2014-09-23 ENCOUNTER — Other Ambulatory Visit: Payer: Self-pay

## 2014-09-23 ENCOUNTER — Encounter (HOSPITAL_BASED_OUTPATIENT_CLINIC_OR_DEPARTMENT_OTHER): Payer: Self-pay

## 2014-09-23 ENCOUNTER — Emergency Department (HOSPITAL_BASED_OUTPATIENT_CLINIC_OR_DEPARTMENT_OTHER): Payer: Medicaid Other

## 2014-09-23 ENCOUNTER — Encounter: Payer: Self-pay | Admitting: Family Medicine

## 2014-09-23 VITALS — BP 111/82 | HR 117 | Temp 98.3°F | Ht 65.0 in | Wt 181.0 lb

## 2014-09-23 DIAGNOSIS — Z8742 Personal history of other diseases of the female genital tract: Secondary | ICD-10-CM | POA: Insufficient documentation

## 2014-09-23 DIAGNOSIS — D473 Essential (hemorrhagic) thrombocythemia: Secondary | ICD-10-CM

## 2014-09-23 DIAGNOSIS — Z87891 Personal history of nicotine dependence: Secondary | ICD-10-CM | POA: Insufficient documentation

## 2014-09-23 DIAGNOSIS — R0789 Other chest pain: Secondary | ICD-10-CM | POA: Insufficient documentation

## 2014-09-23 DIAGNOSIS — Z3202 Encounter for pregnancy test, result negative: Secondary | ICD-10-CM | POA: Diagnosis not present

## 2014-09-23 DIAGNOSIS — E559 Vitamin D deficiency, unspecified: Secondary | ICD-10-CM | POA: Diagnosis not present

## 2014-09-23 DIAGNOSIS — R944 Abnormal results of kidney function studies: Secondary | ICD-10-CM | POA: Diagnosis present

## 2014-09-23 DIAGNOSIS — R000 Tachycardia, unspecified: Secondary | ICD-10-CM

## 2014-09-23 DIAGNOSIS — R079 Chest pain, unspecified: Secondary | ICD-10-CM | POA: Diagnosis present

## 2014-09-23 DIAGNOSIS — K219 Gastro-esophageal reflux disease without esophagitis: Secondary | ICD-10-CM | POA: Diagnosis not present

## 2014-09-23 DIAGNOSIS — Z792 Long term (current) use of antibiotics: Secondary | ICD-10-CM | POA: Diagnosis not present

## 2014-09-23 DIAGNOSIS — Z79899 Other long term (current) drug therapy: Secondary | ICD-10-CM | POA: Diagnosis not present

## 2014-09-23 DIAGNOSIS — D75839 Thrombocytosis, unspecified: Secondary | ICD-10-CM

## 2014-09-23 DIAGNOSIS — Z872 Personal history of diseases of the skin and subcutaneous tissue: Secondary | ICD-10-CM | POA: Diagnosis not present

## 2014-09-23 DIAGNOSIS — Z7952 Long term (current) use of systemic steroids: Secondary | ICD-10-CM | POA: Insufficient documentation

## 2014-09-23 DIAGNOSIS — Z8619 Personal history of other infectious and parasitic diseases: Secondary | ICD-10-CM | POA: Insufficient documentation

## 2014-09-23 LAB — CBC WITH DIFFERENTIAL/PLATELET
Basophils Absolute: 0 10*3/uL (ref 0.0–0.1)
Basophils Relative: 0 % (ref 0–1)
Eosinophils Absolute: 0.1 10*3/uL (ref 0.0–0.7)
Eosinophils Relative: 1 % (ref 0–5)
HCT: 37 % (ref 36.0–46.0)
Hemoglobin: 12.6 g/dL (ref 12.0–15.0)
Lymphocytes Relative: 31 % (ref 12–46)
Lymphs Abs: 3.1 10*3/uL (ref 0.7–4.0)
MCH: 31.6 pg (ref 26.0–34.0)
MCHC: 34.1 g/dL (ref 30.0–36.0)
MCV: 92.7 fL (ref 78.0–100.0)
Monocytes Absolute: 0.8 10*3/uL (ref 0.1–1.0)
Monocytes Relative: 8 % (ref 3–12)
Neutro Abs: 5.8 10*3/uL (ref 1.7–7.7)
Neutrophils Relative %: 60 % (ref 43–77)
Platelets: 315 10*3/uL (ref 150–400)
RBC: 3.99 MIL/uL (ref 3.87–5.11)
RDW: 12.7 % (ref 11.5–15.5)
WBC: 9.8 10*3/uL (ref 4.0–10.5)

## 2014-09-23 LAB — URINALYSIS, ROUTINE W REFLEX MICROSCOPIC
Bilirubin Urine: NEGATIVE
Glucose, UA: NEGATIVE mg/dL
Hgb urine dipstick: NEGATIVE
Ketones, ur: 15 mg/dL — AB
Leukocytes, UA: NEGATIVE
Nitrite: NEGATIVE
Protein, ur: NEGATIVE mg/dL
Specific Gravity, Urine: 1.027 (ref 1.005–1.030)
Urobilinogen, UA: 0.2 mg/dL (ref 0.0–1.0)
pH: 6 (ref 5.0–8.0)

## 2014-09-23 LAB — COMPREHENSIVE METABOLIC PANEL
ALT: 24 U/L (ref 14–54)
AST: 13 U/L — ABNORMAL LOW (ref 15–41)
Albumin: 3.6 g/dL (ref 3.5–5.0)
Alkaline Phosphatase: 69 U/L (ref 38–126)
Anion gap: 5 (ref 5–15)
BUN: 15 mg/dL (ref 6–20)
CO2: 27 mmol/L (ref 22–32)
Calcium: 8.2 mg/dL — ABNORMAL LOW (ref 8.9–10.3)
Chloride: 105 mmol/L (ref 101–111)
Creatinine, Ser: 1.16 mg/dL — ABNORMAL HIGH (ref 0.44–1.00)
GFR calc Af Amer: >60 mL/min (ref >60)
GFR calc non Af Amer: 59 mL/min — ABNORMAL LOW (ref >60)
Glucose, Bld: 126 mg/dL — ABNORMAL HIGH (ref 65–99)
Potassium: 3.4 mmol/L — ABNORMAL LOW (ref 3.5–5.1)
Sodium: 137 mmol/L (ref 135–145)
Total Bilirubin: 0.4 mg/dL (ref 0.3–1.2)
Total Protein: 6.4 g/dL — ABNORMAL LOW (ref 6.5–8.1)

## 2014-09-23 LAB — CBC
HCT: 39.5 % (ref 36.0–46.0)
Hemoglobin: 13.4 g/dL (ref 12.0–15.0)
MCH: 31.2 pg (ref 26.0–34.0)
MCHC: 33.9 g/dL (ref 30.0–36.0)
MCV: 91.9 fL (ref 78.0–100.0)
MPV: 9.8 fL (ref 8.6–12.4)
Platelets: 359 10*3/uL (ref 150–400)
RBC: 4.3 MIL/uL (ref 3.87–5.11)
RDW: 13.7 % (ref 11.5–15.5)
WBC: 9.7 10*3/uL (ref 4.0–10.5)

## 2014-09-23 LAB — PREGNANCY, URINE: Preg Test, Ur: NEGATIVE

## 2014-09-23 LAB — D-DIMER, QUANTITATIVE: D-Dimer, Quant: 0.41 ug/mL-FEU (ref 0.00–0.48)

## 2014-09-23 LAB — TROPONIN I: Troponin I: <0.03 ng/mL (ref <0.031)

## 2014-09-23 MED ORDER — CALCIUM CARBONATE-VITAMIN D 500-200 MG-UNIT PO TABS: 1.0000 | ORAL_TABLET | Freq: Every day | ORAL | Status: DC

## 2014-09-23 MED ORDER — IOHEXOL 350 MG/ML SOLN
100.0000 mL | Freq: Once | INTRAVENOUS | Status: AC | PRN
  Administered 2014-09-23: 100 mL via INTRAVENOUS

## 2014-09-23 MED ORDER — SODIUM CHLORIDE 0.9 % IV BOLUS (SEPSIS)
1000.0000 mL | Freq: Once | INTRAVENOUS | Status: AC
  Administered 2014-09-23: 1000 mL via INTRAVENOUS

## 2014-09-23 MED ORDER — KETOROLAC TROMETHAMINE 30 MG/ML IJ SOLN
30.0000 mg | Freq: Once | INTRAMUSCULAR | Status: AC
  Administered 2014-09-23: 30 mg via INTRAVENOUS
  Filled 2014-09-23: qty 1

## 2014-09-23 NOTE — Addendum Note (Signed)
Addended by: Katharina Caper, Shawniece Oyola D on: 09/23/2014 04:04 PM   Modules accepted: Orders

## 2014-09-23 NOTE — ED Notes (Signed)
Patient transported to CT 

## 2014-09-23 NOTE — ED Notes (Signed)
amb to BR w/o difficulty 

## 2014-09-23 NOTE — Progress Notes (Signed)
Subjective: Christina Allen is a 39 y.o. female presenting for follow up from dermatology.  Recently seen by dermatologist who wanted to start a medication for pemphigus that could impair renal function so did labs which showed abnormal kidney function. He started the medication and had her follow up here. She does not take NSAIDs, was changed from PPI to zantac per her GI. No history of kidney insufficiency and last labs were normal. She also was told she's at increased risk of clots based on the blood work - she thinks her platelets were high, and wants to know if the nexplanon is ok.  - ROS: No fevers, leg swelling, palpitations, chest pain, dyspnea, change in urine character or output.  - Non-smoker  Objective: BP 111/82 mmHg  Pulse 117  Temp(Src) 98.3 F (36.8 C) (Oral)  Ht 5\' 5"  (1.651 m)  Wt 181 lb (82.101 kg)  BMI 30.12 kg/m2 Gen: Well 39 y.o. female in no distress Ext: No LE edema or tenderness  Assessment/Plan: Christina Allen is a 39 y.o. female here for impaired kidney function.  See problem list for plan.

## 2014-09-23 NOTE — ED Notes (Signed)
Patient presents to ED with chest pain that has been going on for 2-3 weeks. Patient reports pain increases when taking a deep breath. Patient reports having an elevated HR and was seen today as her doctors office. Patient reports chest was not hurting today at her doctors office but it began shortly after so she came in to be seen.

## 2014-09-23 NOTE — Assessment & Plan Note (Signed)
By report based on labs done by dermatologist. She was also urged to discuss her increased risk for clot. We will recheck BMP and CBC (last Cr wnl). Has nexplanon which does not confer the risk of thrombosis that combination contraceptives do.

## 2014-09-23 NOTE — Assessment & Plan Note (Signed)
Pt opts to start daily Vit D - Ca at this time.

## 2014-09-23 NOTE — ED Provider Notes (Signed)
CSN: [id number]     Arrival date & time 09/23/14  2012 History  This chart was scribed for Ezequiel Essex, MD by Helane Gunther, ED Scribe. This patient was seen in room MH04/MH04 and the patient's care was started at 9:09 PM.    Chief Complaint  Patient presents with  . Chest Pain   The history is provided by the patient. No language interpreter was used.   HPI Comments: Christina Allen is a 39 y.o. female who presents to the Emergency Department complaining of intermittent CP onset 3 weeks. She states the CP occurs about every day for several hours at a time. She notes the pain radiates up to the right side of the neck. She reports associated SOB, dyspnea, cough, congestion, sinus pressure, rhinorrhea (clear), and left-sided lower back pain. She has a PMHx of stomach problems. Pt denies a PMHx of heart problems and blood clots, and is not on any blood thinners. She is on [facility]. She has been on prednisone for about a month. Pt is also taking allergy medication. She has been to see her doctor today, where an elevated HR was discovered and blood work was done, but she does not know the results.  Pt denies abdominal pain.  Past Medical History  Diagnosis Date  . GERD (gastroesophageal reflux disease)   . H/O varicella   . Trichomonas   . Leukorrhea 04/08/2007    Physiologic  . Abnormal Pap smear 2004  . Yeast infection   . Bacterial infection   . Preterm labor   . Endometriosis   . Pemphigus vulgaris   . Anal fissure   . Dysmenorrhea   . Menorrhagia   . Vaginitis   . Allergy    Past Surgical History  Procedure Laterality Date  . Other surgical history      tubal ligation and c-section  . Wisdom tooth extraction    . Cesarean section    . Dilation and curettage of uterus    . Tubal ligation     Family History  Problem Relation Age of Onset  . Hypertension Mother   . Kidney disease Mother   . COPD Maternal Grandmother     Emphysema  . Cancer Maternal Grandmother   . Hypertension  Father   . Alcohol abuse Father   . Alcohol abuse Sister   . Alcohol abuse Brother   . Cancer Maternal Aunt   . Diabetes Maternal Uncle   . Cancer Paternal Grandmother    History  Substance Use Topics  . Smoking status: Former Smoker    Quit date: 03/06/2003  . Smokeless tobacco: Not on file  . Alcohol Use: No   OB History    Gravida Para Term Preterm AB TAB SAB Ectopic Multiple Living   3 3        3      Review of Systems A complete 10 system review of systems was obtained and all systems are negative except as noted in the HPI and PMH.   Allergies  Shellfish-derived products  Home Medications   Prior to Admission medications   Medication Sig Start Date End Date Taking? Authorizing Provider  amoxicillin-clavulanate (AUGMENTIN) 875-125 MG per tablet Take 1 tablet by mouth 2 (two) times daily. Patient not taking: Reported on 07/27/2014 06/02/14   Patrecia Pour, MD  calcium-vitamin D Darron Doom WITH D) 500-200 MG-UNIT per tablet Take 1 tablet by mouth daily with breakfast. 09/23/14   Patrecia Pour, MD  dicyclomine (BENTYL) 20 MG tablet  Take 1 tablet (20 mg total) by mouth 2 (two) times daily. Patient taking differently: Take 20 mg by mouth 2 (two) times daily as needed for spasms (spasms).  07/27/14   Waynetta Pean, PA-C  lansoprazole (PREVACID) 30 MG capsule Take 1 capsule (30 mg total) by mouth 2 (two) times daily before a meal. Patient not taking: Reported on 09/01/2014 06/09/14   Jerene Bears, MD  Linaclotide [facility]) 145 MCG CAPS capsule Take 1 capsule (145 mcg total) by mouth daily. Take 30 minutes before first meal of the day Patient not taking: Reported on 07/27/2014 02/16/14   Jerene Bears, MD  loratadine (CLARITIN) 10 MG tablet Take 1 tablet (10 mg total) by mouth daily. Patient not taking: Reported on 07/27/2014 04/30/14   Patrecia Pour, MD  lubiprostone (AMITIZA) 8 MCG capsule Take 1 capsule (8 mcg total) by mouth 2 (two) times daily with a meal. Patient not taking: Reported on  07/27/2014 03/15/14   Jerene Bears, MD  montelukast (SINGULAIR) 10 MG tablet Take 10 mg by mouth at bedtime.  01/17/14   Historical Provider, MD  NASONEX 50 MCG/ACT nasal spray USE 2 SPRAYS IN EACH NOSTRIL DAILY Patient not taking: Reported on 09/01/2014 05/19/14   Patrecia Pour, MD  Olopatadine HCl 0.2 % SOLN Apply 1 drop to eye daily. Patient not taking: Reported on 09/01/2014 05/18/14   Timmothy Euler, MD  oxyCODONE-acetaminophen (PERCOCET/ROXICET) 5-325 MG per tablet Take 1-2 tablets by mouth every 6 (six) hours as needed for severe pain. 09/01/14   Montine Circle, PA-C  pantoprazole (PROTONIX) 40 MG tablet Take 1 tablet (40 mg total) by mouth daily. 05/13/14   Jerene Bears, MD  predniSONE (DELTASONE) 20 MG tablet Take 2 tablets (40 mg total) by mouth daily. Take 40 mg by mouth daily for 3 days, then 20mg  by mouth daily for 3 days, then 10mg  daily for 3 days 08/02/14   Antonietta Breach, PA-C  predniSONE (DELTASONE) 50 MG tablet Take 50 mg by mouth daily with breakfast.    Historical Provider, MD  PROAIR HFA 108 (90 BASE) MCG/ACT inhaler Inhale 2 puffs into the lungs every 4 (four) hours as needed. Cough or wheeze.  May use 2 puffs 10-20 minutes prior to exercise. 06/19/14   Historical Provider, MD  ranitidine (ZANTAC) 150 MG capsule Take 1 capsule (150 mg total) by mouth 2 (two) times daily. 09/16/14   Jerene Bears, MD  Vitamin D, Ergocalciferol, (DRISDOL) 50000 UNITS CAPS capsule Take 1 capsule (50,000 Units total) by mouth every 7 (seven) days. Patient not taking: Reported on 09/01/2014 05/05/14   Patrecia Pour, MD   BP 112/72 mmHg  Pulse 104  Temp(Src) 98.8 F (37.1 C) (Oral)  Resp 16  Ht 5\' 3"  (1.6 m)  Wt 180 lb (81.647 kg)  BMI 31.89 kg/m2  SpO2 98% Physical Exam  Constitutional: She is oriented to person, place, and time. She appears well-developed and well-nourished. No distress.  HENT:  Head: Normocephalic and atraumatic.  Mouth/Throat: Oropharynx is clear and moist. No oropharyngeal exudate.   Frontal sinus tenderness  Eyes: Conjunctivae and EOM are normal. Pupils are equal, round, and reactive to light.  Neck: Normal range of motion. Neck supple.  No meningismus.  Cardiovascular: Normal rate, regular rhythm, normal heart sounds and intact distal pulses.   No murmur heard. Equal radial pulses  Pulmonary/Chest: Effort normal and breath sounds normal. No respiratory distress. She exhibits tenderness.  Lungs clear to auscultation. Central chest tenderness.  Abdominal: Soft. There is no tenderness. There is no rebound and no guarding.  Musculoskeletal: Normal range of motion. She exhibits no edema or tenderness.  Neurological: She is alert and oriented to person, place, and time. No cranial nerve deficit. She exhibits normal muscle tone. Coordination normal.  No ataxia on finger to nose bilaterally. No pronator drift. 5/5 strength throughout. CN 2-12 intact. Negative Romberg. Equal grip strength. Sensation intact. Gait is normal.   Skin: Skin is warm.  Psychiatric: She has a normal mood and affect. Her behavior is normal.  Nursing note and vitals reviewed.   ED Course  Procedures  DIAGNOSTIC STUDIES: Oxygen Saturation is 99% on RA, normal by my interpretation.    COORDINATION OF CARE: 9:16 PM - Discussed plans to perform diagnostic studies. Pt advised of plan for treatment and pt agrees.\  Labs Review Labs Reviewed  COMPREHENSIVE METABOLIC PANEL - Abnormal; Notable for the following:    Potassium 3.4 (*)    Glucose, Bld 126 (*)    Creatinine, Ser 1.16 (*)    Calcium 8.2 (*)    Total Protein 6.4 (*)    AST 13 (*)    GFR calc non Af Amer 59 (*)    All other components within normal limits  URINALYSIS, ROUTINE W REFLEX MICROSCOPIC (NOT AT [facility]) - Abnormal; Notable for the following:    Ketones, ur 15 (*)    All other components within normal limits  CBC WITH DIFFERENTIAL/PLATELET  TROPONIN I  D-DIMER, QUANTITATIVE (NOT AT [facility])  PREGNANCY, URINE  TSH    Imaging  Review Dg Chest 2 View  09/23/2014   CLINICAL DATA:  Chest pain and shortness of breath. Cough and congestion.  EXAM: CHEST  2 VIEW  COMPARISON:  06/29/2014; 05/19/2014; 02/15/2014  FINDINGS: Grossly unchanged cardiac silhouette and mediastinal contours. Right infrahilar leaning radius opacities are unchanged in favored to represent atelectasis. No new focal airspace opacities. There is minimal pleural parenchymal thickening about the right major and minor fissures. No pleural effusion or pneumothorax. No evidence of edema. No acute osseus abnormalities.  IMPRESSION: No acute cardiopulmonary disease. Specifically, no evidence of pneumonia.   Electronically Signed   By: Sandi Mariscal M.D.   On: 09/23/2014 21:58   Ct Angio Chest Pe W/cm &/or Wo Cm  09/24/2014   CLINICAL DATA:  Subacute onset of generalized chest pain, radiating to the right side of the neck. Dyspnea, cough, shortness of breath, congestion, sinus pressure, rhinorrhea and left lower back pain. Initial encounter.  EXAM: CT ANGIOGRAPHY CHEST WITH CONTRAST  TECHNIQUE: Multidetector CT imaging of the chest was performed using the standard protocol during bolus administration of intravenous contrast. Multiplanar CT image reconstructions and MIPs were obtained to evaluate the vascular anatomy.  CONTRAST:  121mL OMNIPAQUE IOHEXOL 350 MG/ML SOLN  COMPARISON:  Chest radiograph performed earlier today at 9:46 p.m.  FINDINGS: There is no evidence of pulmonary embolus.  Minimal bibasilar atelectasis is noted. The lungs are otherwise clear. There is no evidence of significant focal consolidation, pleural effusion or pneumothorax. No masses are identified; no abnormal focal contrast enhancement is seen.  The mediastinum is unremarkable in appearance. No mediastinal lymphadenopathy is seen. No pericardial effusion is identified. The great vessels are grossly unremarkable. No axillary lymphadenopathy is seen. The thyroid gland is unremarkable in appearance.  The  visualized portions of the liver and spleen are unremarkable.  No acute osseous abnormalities are seen. There is slight chronic deformity involving the superior endplate of C7.  Review of  the MIP images confirms the above findings.  IMPRESSION: 1. No evidence of pulmonary embolus. 2. Minimal bibasilar atelectasis noted; lungs otherwise clear.   Electronically Signed   By: Garald Balding M.D.   On: 09/24/2014 00:54     EKG Interpretation   Date/Time:  Thursday September 23 2014 20:20:53 EDT Ventricular Rate:  114 PR Interval:  132 QRS Duration: 82 QT Interval:  328 QTC Calculation: 452 R Axis:   -31 Text Interpretation:  Sinus tachycardia Left axis deviation Abnormal ECG  No significant change was found Confirmed by Wyvonnia Dusky  MD, Tharon Bomar [phone])  on 09/23/2014 9:20:55 PM      MDM   Final diagnoses:  Atypical chest pain  Tachycardia   3 weeks atypical chest pain, worse with deep breathing, some SOB.  Hx implanon.  Tachycardic to 110s.  EKG sinus.  CXR negative. Pain somewhat reproducible. D-dimer obtained and negative.  No evidence of PE.  Patient has been tachycardic on previous visits and was at PCP's today as well.  TSH sent.    HR 104.  CT negative for PE. Low suspicion for ACS. D/w patient need for follow up with PCP regarding thyroid studies.  Ezequiel Essex, MD 09/24/14 [phone]

## 2014-09-23 NOTE — Patient Instructions (Signed)
We'll check your labs today to make sure your kidney function is back to normal and that your platelets are also normal. We'll call you if abnormal results or anything needs to be done, otherwise expect a letter.

## 2014-09-24 ENCOUNTER — Telehealth: Payer: Self-pay | Admitting: Family Medicine

## 2014-09-24 DIAGNOSIS — N809 Endometriosis, unspecified: Secondary | ICD-10-CM

## 2014-09-24 DIAGNOSIS — R000 Tachycardia, unspecified: Secondary | ICD-10-CM

## 2014-09-24 LAB — BASIC METABOLIC PANEL
BUN: 13 mg/dL (ref 6–23)
CO2: 24 mEq/L (ref 19–32)
Calcium: 8.9 mg/dL (ref 8.4–10.5)
Chloride: 105 mEq/L (ref 96–112)
Creat: 1 mg/dL (ref 0.50–1.10)
Glucose, Bld: 113 mg/dL — ABNORMAL HIGH (ref 70–99)
Potassium: 3.8 mEq/L (ref 3.5–5.3)
Sodium: 141 mEq/L (ref 135–145)

## 2014-09-24 LAB — TSH: TSH: 1.4 u[IU]/mL (ref 0.350–4.500)

## 2014-09-24 LAB — PROTIME-INR
INR: 1 (ref <1.50)
Prothrombin Time: 13.2 seconds (ref 11.6–15.2)

## 2014-09-24 NOTE — Telephone Encounter (Signed)
Pt called because the calcium is not covered by Medicaid and she needs something else called in that would be covered. Also she wanted to make sure that the doctor is putting in the referral for her to see an OBGYN and she thinks that she should also get a referral to a heart specialist since she has a high heart rate. jw

## 2014-09-24 NOTE — Discharge Instructions (Signed)
Chest Pain (Nonspecific) There is no evidence of heart attack or blood clot in the lung. Follow up with your doctor regarding your thyroid test. Return to the ED if you develop new or worsening symptoms. It is often hard to give a specific diagnosis for the cause of chest pain. There is always a chance that your pain could be related to something serious, such as a heart attack or a blood clot in the lungs. You need to follow up with your health care provider for further evaluation. CAUSES   Heartburn.  Pneumonia or bronchitis.  Anxiety or stress.  Inflammation around your heart (pericarditis) or lung (pleuritis or pleurisy).  A blood clot in the lung.  A collapsed lung (pneumothorax). It can develop suddenly on its own (spontaneous pneumothorax) or from trauma to the chest.  Shingles infection (herpes zoster virus). The chest wall is composed of bones, muscles, and cartilage. Any of these can be the source of the pain.  The bones can be bruised by injury.  The muscles or cartilage can be strained by coughing or overwork.  The cartilage can be affected by inflammation and become sore (costochondritis). DIAGNOSIS  Lab tests or other studies may be needed to find the cause of your pain. Your health care provider may have you take a test called an ambulatory electrocardiogram (ECG). An ECG records your heartbeat patterns over a 24-hour period. You may also have other tests, such as:  Transthoracic echocardiogram (TTE). During echocardiography, sound waves are used to evaluate how blood flows through your heart.  Transesophageal echocardiogram (TEE).  Cardiac monitoring. This allows your health care provider to monitor your heart rate and rhythm in real time.  Holter monitor. This is a portable device that records your heartbeat and can help diagnose heart arrhythmias. It allows your health care provider to track your heart activity for several days, if needed.  Stress tests by  exercise or by giving medicine that makes the heart beat faster. TREATMENT   Treatment depends on what may be causing your chest pain. Treatment may include:  Acid blockers for heartburn.  Anti-inflammatory medicine.  Pain medicine for inflammatory conditions.  Antibiotics if an infection is present.  You may be advised to change lifestyle habits. This includes stopping smoking and avoiding alcohol, caffeine, and chocolate.  You may be advised to keep your head raised (elevated) when sleeping. This reduces the chance of acid going backward from your stomach into your esophagus. Most of the time, nonspecific chest pain will improve within 2-3 days with rest and mild pain medicine.  HOME CARE INSTRUCTIONS   If antibiotics were prescribed, take them as directed. Finish them even if you start to feel better.  For the next few days, avoid physical activities that bring on chest pain. Continue physical activities as directed.  Do not use any tobacco products, including cigarettes, chewing tobacco, or electronic cigarettes.  Avoid drinking alcohol.  Only take medicine as directed by your health care provider.  Follow your health care provider's suggestions for further testing if your chest pain does not go away.  Keep any follow-up appointments you made. If you do not go to an appointment, you could develop lasting (chronic) problems with pain. If there is any problem keeping an appointment, call to reschedule. SEEK MEDICAL CARE IF:   Your chest pain does not go away, even after treatment.  You have a rash with blisters on your chest.  You have a fever. SEEK IMMEDIATE MEDICAL CARE IF:  You have increased chest pain or pain that spreads to your arm, neck, jaw, back, or abdomen.  You have shortness of breath.  You have an increasing cough, or you cough up blood.  You have severe back or abdominal pain.  You feel nauseous or vomit.  You have severe weakness.  You  faint.  You have chills. This is an emergency. Do not wait to see if the pain will go away. Get medical help at once. Call your local emergency services (911 in [place] Do not drive yourself to the hospital. MAKE SURE YOU:   Understand these instructions.  Will watch your condition.  Will get help right away if you are not doing well or get worse. Document Released: 11/29/2004 Document Revised: 02/24/2013 Document Reviewed: 09/25/2007 [facility] Patient Information 2015 [place], [place]. This information is not intended to replace advice given to you by your health care provider. Make sure you discuss any questions you have with your health care provider.

## 2014-09-27 MED ORDER — METOPROLOL SUCCINATE ER 25 MG PO TB24: 25.0000 mg | ORAL_TABLET | Freq: Every day | ORAL | Status: DC

## 2014-09-27 NOTE — Telephone Encounter (Deleted)
Pt called back and all results were reviewed. Pt amenable to trying metoprolol (verified medicaid coverage for generic metoprolol succinate). She will also be able to pay for vit D - [place] without medicaid coverage.  Desires OB referral to discuss hysterectomy. She has been on nexplanon to treat severe cramping associated with endometriosis. She now wishes to discontinue nexplanon and pursue hysterectomy. She's had BTL. She has seen [facility] (can't remember provider) but is ok to go back there or to go to [facility] referral.   Laurin Morgenstern B. Bonner Puna, MD, PGY-3 09/27/2014 12:09 PM

## 2014-09-27 NOTE — Telephone Encounter (Addendum)
Sinus tachycardia: Pt has a history of steadily increasing baseline HR most recently > 100 without symptoms of infection, volume depletion, though did report chest pain 7/21 AFTER office visit prompting ED visit with negative work up. ECG demonstrated sinus tachycardia. Will trial metoprolol. Medicaid covers this. Pt amenable to this.   Impaired renal function: Not demonstrated on most recent labs. Will inform patient of results with letter and telephone.   Risk of clots: As reported by dermatologist as "high platelets." Luckily this is not demonstrated on her normal CBC and normal coagulation studies. She should be reassured.   Upon review of medicaid formulary, no Calcium - Vit D supplement appears to be covered. She will pay for this out-of-pocket.  Desires OB referral to discuss hysterectomy. She has been on nexplanon to treat severe cramping associated with endometriosis. She now wishes to discontinue nexplanon and pursue hysterectomy. She's had BTL. She has seen [facility] (can't remember provider) but is ok to go back there or to go to [facility] referral.   Discussed all of the above with patient.   Christina Allen B. Bonner Puna, MD, PGY-3  09/27/2014 12:09 PM

## 2014-10-05 ENCOUNTER — Telehealth: Payer: Self-pay | Admitting: Family Medicine

## 2014-10-05 NOTE — Telephone Encounter (Signed)
Needs referral sent to dr Zigmund Daniel See referral notes When she called the number she was told we had to do the referral first before an appt could be made

## 2014-10-05 NOTE — Telephone Encounter (Signed)
Patient has been scheduled with Dr. Alden Hipp at [place] on 10/28/14 @ 1:15pm. Pt is aware of this appt.

## 2015-03-29 ENCOUNTER — Emergency Department (HOSPITAL_BASED_OUTPATIENT_CLINIC_OR_DEPARTMENT_OTHER)
Admission: EM | Admit: 2015-03-29 | Discharge: 2015-03-29 | Disposition: A | Discharge status: Home / Self Care | Payer: Medicaid Other | Attending: Emergency Medicine | Admitting: Emergency Medicine

## 2015-03-29 ENCOUNTER — Emergency Department (HOSPITAL_BASED_OUTPATIENT_CLINIC_OR_DEPARTMENT_OTHER): Payer: Medicaid Other

## 2015-03-29 ENCOUNTER — Encounter (HOSPITAL_BASED_OUTPATIENT_CLINIC_OR_DEPARTMENT_OTHER): Payer: Self-pay

## 2015-03-29 DIAGNOSIS — M25511 Pain in right shoulder: Secondary | ICD-10-CM

## 2015-03-29 DIAGNOSIS — K219 Gastro-esophageal reflux disease without esophagitis: Secondary | ICD-10-CM | POA: Insufficient documentation

## 2015-03-29 DIAGNOSIS — Z872 Personal history of diseases of the skin and subcutaneous tissue: Secondary | ICD-10-CM | POA: Insufficient documentation

## 2015-03-29 DIAGNOSIS — Z87891 Personal history of nicotine dependence: Secondary | ICD-10-CM | POA: Insufficient documentation

## 2015-03-29 DIAGNOSIS — Z8719 Personal history of other diseases of the digestive system: Secondary | ICD-10-CM | POA: Insufficient documentation

## 2015-03-29 DIAGNOSIS — Z8742 Personal history of other diseases of the female genital tract: Secondary | ICD-10-CM | POA: Insufficient documentation

## 2015-03-29 MED ORDER — IBUPROFEN 800 MG PO TABS: 800.0000 mg | ORAL_TABLET | Freq: Three times a day (TID) | ORAL | Status: DC

## 2015-03-29 MED ORDER — ACETAMINOPHEN 500 MG PO TABS
1000.0000 mg | ORAL_TABLET | Freq: Once | ORAL | Status: AC
  Administered 2015-03-29: 1000 mg via ORAL
  Filled 2015-03-29: qty 2

## 2015-03-29 NOTE — ED Notes (Signed)
Presents with rt shoulder pain, onset this past Saturday, denies any injury to area, never has had pain in this area previously

## 2015-03-29 NOTE — ED Notes (Signed)
Strong grip noted, normal sensation noted, denies any numbness or tingling in rt arm

## 2015-03-29 NOTE — ED Notes (Signed)
Attempted to provide DC instructions to pt along with providing rx as written by EDP, pt refused to sign that DC instructions were given, snatched the paper work from RNs hand, was verbally abusive to this RN, stated she needed a "shot in her arm", informed pt that this RN spoke with provider and Rx for Ibuprofen 800mg  was written, attempted to explain drug use and indications, pt walked out of ED. Sling on Rt arm per EDPs orders. Charge RN aware of pts comments made to staff and demonstration of anger.

## 2015-03-29 NOTE — ED Provider Notes (Signed)
CSN: [id number]     Arrival date & time 03/29/15  1534 History   First MD Initiated Contact with Patient 03/29/15 1613     Chief Complaint  Patient presents with  . Shoulder Pain   HPI  Christina Allen is a 40 year old female presenting with shoulder pain. She reports waking up 3 days ago with right-sided shoulder pain. She denies injury or pain to the shoulder before going to bed the evening before. She states "I think I slept on it funny". The pain is generalized over the shoulder and described as aching. The pain has been constant since onset. Movement of the arm exacerbates the pain. She states when she moves her arm the pain "goes from an ache to a deep ache". She has taken motrin without significant relief. She states that holding her arm still and close to her body helps alleviate the pain. She denies numbness, tingling, loss of sensation or weakness of the right arm. She has no other complaints today.  Past Medical History  Diagnosis Date  . GERD (gastroesophageal reflux disease)   . H/O varicella   . Trichomonas   . Leukorrhea 04/08/2007    Physiologic  . Abnormal Pap smear 2004  . Yeast infection   . Bacterial infection   . Preterm labor   . Endometriosis   . Pemphigus vulgaris   . Anal fissure   . Dysmenorrhea   . Menorrhagia   . Vaginitis   . Allergy    Past Surgical History  Procedure Laterality Date  . Other surgical history      tubal ligation and c-section  . Wisdom tooth extraction    . Cesarean section    . Dilation and curettage of uterus    . Tubal ligation     Family History  Problem Relation Age of Onset  . Hypertension Mother   . Kidney disease Mother   . COPD Maternal Grandmother     Emphysema  . Cancer Maternal Grandmother   . Hypertension Father   . Alcohol abuse Father   . Alcohol abuse Sister   . Alcohol abuse Brother   . Cancer Maternal Aunt   . Diabetes Maternal Uncle   . Cancer Paternal Grandmother    Social History  Substance Use Topics  .  Smoking status: Former Smoker    Quit date: 03/06/2003  . Smokeless tobacco: None  . Alcohol Use: No   OB History    Gravida Para Term Preterm AB TAB SAB Ectopic Multiple Living   3 3        3      Review of Systems  Musculoskeletal: Positive for arthralgias.  All other systems reviewed and are negative.     Allergies  Shellfish-derived products  Home Medications   Prior to Admission medications   Medication Sig Start Date End Date Taking? Authorizing Provider  amoxicillin-clavulanate (AUGMENTIN) 875-125 MG per tablet Take 1 tablet by mouth 2 (two) times daily. Patient not taking: Reported on 07/27/2014 06/02/14   Patrecia Pour, MD  calcium-vitamin D Darron Doom WITH D) 500-200 MG-UNIT per tablet Take 1 tablet by mouth daily with breakfast. 09/23/14   Patrecia Pour, MD  dicyclomine (BENTYL) 20 MG tablet Take 1 tablet (20 mg total) by mouth 2 (two) times daily. Patient taking differently: Take 20 mg by mouth 2 (two) times daily as needed for spasms (spasms).  07/27/14   Waynetta Pean, PA-C  lansoprazole (PREVACID) 30 MG capsule Take 1 capsule (30 mg total)  by mouth 2 (two) times daily before a meal. Patient not taking: Reported on 09/01/2014 06/09/14   Jerene Bears, MD  Linaclotide [facility]) 145 MCG CAPS capsule Take 1 capsule (145 mcg total) by mouth daily. Take 30 minutes before first meal of the day Patient not taking: Reported on 07/27/2014 02/16/14   Jerene Bears, MD  loratadine (CLARITIN) 10 MG tablet Take 1 tablet (10 mg total) by mouth daily. Patient not taking: Reported on 07/27/2014 04/30/14   Patrecia Pour, MD  lubiprostone (AMITIZA) 8 MCG capsule Take 1 capsule (8 mcg total) by mouth 2 (two) times daily with a meal. Patient not taking: Reported on 07/27/2014 03/15/14   Jerene Bears, MD  metoprolol succinate (TOPROL-XL) 25 MG 24 hr tablet Take 1 tablet (25 mg total) by mouth daily. 09/27/14   Patrecia Pour, MD  montelukast (SINGULAIR) 10 MG tablet Take 10 mg by mouth at bedtime.  01/17/14    Historical Provider, MD  NASONEX 50 MCG/ACT nasal spray USE 2 SPRAYS IN EACH NOSTRIL DAILY Patient not taking: Reported on 09/01/2014 05/19/14   Patrecia Pour, MD  Olopatadine HCl 0.2 % SOLN Apply 1 drop to eye daily. Patient not taking: Reported on 09/01/2014 05/18/14   Timmothy Euler, MD  oxyCODONE-acetaminophen (PERCOCET/ROXICET) 5-325 MG per tablet Take 1-2 tablets by mouth every 6 (six) hours as needed for severe pain. 09/01/14   Montine Circle, PA-C  pantoprazole (PROTONIX) 40 MG tablet Take 1 tablet (40 mg total) by mouth daily. 05/13/14   Jerene Bears, MD  predniSONE (DELTASONE) 20 MG tablet Take 2 tablets (40 mg total) by mouth daily. Take 40 mg by mouth daily for 3 days, then 20mg  by mouth daily for 3 days, then 10mg  daily for 3 days 08/02/14   Antonietta Breach, PA-C  predniSONE (DELTASONE) 50 MG tablet Take 50 mg by mouth daily with breakfast.    Historical Provider, MD  PROAIR HFA 108 (90 BASE) MCG/ACT inhaler Inhale 2 puffs into the lungs every 4 (four) hours as needed. Cough or wheeze.  May use 2 puffs 10-20 minutes prior to exercise. 06/19/14   Historical Provider, MD  ranitidine (ZANTAC) 150 MG capsule Take 1 capsule (150 mg total) by mouth 2 (two) times daily. 09/16/14   Jerene Bears, MD  Vitamin D, Ergocalciferol, (DRISDOL) 50000 UNITS CAPS capsule Take 1 capsule (50,000 Units total) by mouth every 7 (seven) days. Patient not taking: Reported on 09/01/2014 05/05/14   Patrecia Pour, MD   BP 116/87 mmHg  Pulse 89  Temp(Src) 98.1 F (36.7 C) (Oral)  Resp 18  Ht 5\' 3"  (1.6 m)  Wt 63.504 kg  BMI 24.81 kg/m2  SpO2 100% Physical Exam  Constitutional: She appears well-developed and well-nourished. No distress.  HENT:  Head: Normocephalic and atraumatic.  Eyes: Conjunctivae are normal. Right eye exhibits no discharge. Left eye exhibits no discharge. No scleral icterus.  Neck: Normal range of motion.  Cardiovascular: Normal rate, regular rhythm and intact distal pulses.   Radial pulse palpable   Pulmonary/Chest: Effort normal. No respiratory distress.  Musculoskeletal:       Right shoulder: She exhibits decreased range of motion and tenderness. She exhibits no swelling, no crepitus, no deformity, normal pulse and normal strength.  Restricted range of motion of the right shoulder secondary to pain. Passive range of motion intact. Patient states it is painful with flexing her shoulder past 90. Generalized, mild tenderness to palpation over the shoulder. No edema or  deformity. Range of motion at the elbow, wrist and digits intact and painless. Negative speeds test. Positive Neer's. Negative Hawkins.   Neurological: She is alert. Coordination normal.  5/5 strength of BUE. Pain on testing of right shoulder. Sensation to light touch intact.   Skin: Skin is warm and dry.  Psychiatric: She has a normal mood and affect. Her behavior is normal.  Nursing note and vitals reviewed.   ED Course  Procedures (including critical care time) Labs Review Labs Reviewed - No data to display  Imaging Review Dg Shoulder Right  03/29/2015  CLINICAL DATA:  RIGHT shoulder pain for 3 days, no known injury EXAM: RIGHT SHOULDER - 2+ VIEW COMPARISON:  09/27/2003 FINDINGS: Osseous mineralization normal. AC joint alignment normal. No acute fracture, dislocation or bone destruction. Visualized ribs unremarkable. IMPRESSION: Normal exam, unchanged. Electronically Signed   By: Lavonia Takeyah M.D.   On: 03/29/2015 16:22   I have personally reviewed and evaluated these images and lab results as part of my medical decision-making.   EKG Interpretation None      MDM   Final diagnoses:  Right shoulder pain   Patient presenting with non-traumatic right shoulder pain. Right arm is neurovascularly intact with FROM. Positive Neer's test possible shoulder impingement syndrome. Patient X-Ray negative for obvious fracture or dislocation. Pain managed in ED with tylenol. Sling given and conservative therapy recommended.  Discussed RICE therapy and use of NSAIDs. Pt advised to follow up with orthopedics if symptoms persist. Return precautions discussed at bedside and given in discharge paperwork. Pt is stable for discharge.     Lahoma Crocker Amyia Lodwick, PA-C 03/29/15 1711  Tanna Furry, MD 04/06/15 Shelah Lewandowsky

## 2015-03-29 NOTE — Discharge Instructions (Signed)
Use over-the-counter Motrin or Tylenol for pain relief. Use the shoulder sling provided for comfort. Scheduling follow-up appointment with orthopedics if your symptoms persist.   Shoulder Pain The shoulder is the joint that connects your arms to your body. The bones that form the shoulder joint include the upper arm bone (humerus), the shoulder blade (scapula), and the collarbone (clavicle). The top of the humerus is shaped like a ball and fits into a rather flat socket on the scapula (glenoid cavity). A combination of muscles and strong, fibrous tissues that connect muscles to bones (tendons) support your shoulder joint and hold the ball in the socket. Small, fluid-filled sacs (bursae) are located in different areas of the joint. They act as cushions between the bones and the overlying soft tissues and help reduce friction between the gliding tendons and the bone as you move your arm. Your shoulder joint allows a wide range of motion in your arm. This range of motion allows you to do things like scratch your back or throw a ball. However, this range of motion also makes your shoulder more prone to pain from overuse and injury. Causes of shoulder pain can originate from both injury and overuse and usually can be grouped in the following four categories:  Redness, swelling, and pain (inflammation) of the tendon (tendinitis) or the bursae (bursitis).  Instability, such as a dislocation of the joint.  Inflammation of the joint (arthritis).  Broken bone (fracture). HOME CARE INSTRUCTIONS   Apply ice to the sore area.  Put ice in a plastic bag.  Place a towel between your skin and the bag.  Leave the ice on for 15-20 minutes, 3-4 times per day for the first 2 days, or as directed by your health care provider.  Stop using cold packs if they do not help with the pain.  If you have a shoulder sling or immobilizer, wear it as long as your caregiver instructs. Only remove it to shower or bathe. Move  your arm as little as possible, but keep your hand moving to prevent swelling.  Squeeze a soft ball or foam pad as much as possible to help prevent swelling.  Only take over-the-counter or prescription medicines for pain, discomfort, or fever as directed by your caregiver. SEEK MEDICAL CARE IF:   Your shoulder pain increases, or new pain develops in your arm, hand, or fingers.  Your hand or fingers become cold and numb.  Your pain is not relieved with medicines. SEEK IMMEDIATE MEDICAL CARE IF:   Your arm, hand, or fingers are numb or tingling.  Your arm, hand, or fingers are significantly swollen or turn white or blue. MAKE SURE YOU:   Understand these instructions.  Will watch your condition.  Will get help right away if you are not doing well or get worse.   This information is not intended to replace advice given to you by your health care provider. Make sure you discuss any questions you have with your health care provider.   Document Released: 11/29/2004 Document Revised: 03/12/2014 Document Reviewed: 06/14/2014 Elsevier Interactive Patient Education [employer].

## 2015-03-29 NOTE — ED Notes (Signed)
Attempted to complete DC tab to assess CMS after sling application and evaluate pain status after immobilization and providing PO Tylenol, pt refused further discussion or care from nsg staff, immediately walked out of ED

## 2015-03-29 NOTE — ED Notes (Signed)
Pt reports this morning with pain in R shoulder, worse with movement.  Denies n/t.

## 2015-05-13 ENCOUNTER — Telehealth: Payer: Self-pay | Admitting: Internal Medicine

## 2015-05-16 MED ORDER — DEXLANSOPRAZOLE 60 MG PO CPDR: 60.0000 mg | DELAYED_RELEASE_CAPSULE | Freq: Every day | ORAL | Status: DC

## 2015-05-16 NOTE — Telephone Encounter (Signed)
Ok for Dexilant 60 mg daily

## 2015-05-16 NOTE — Telephone Encounter (Signed)
Pt aware and script sent to pharmacy. 

## 2015-05-16 NOTE — Telephone Encounter (Signed)
Pt states she has been taking zantac for her reflux but she continues to have issues with heartburn. Pt states that her insurance has changed and she would like to try going back to dexilant. Please advise.

## 2015-05-18 ENCOUNTER — Encounter: Payer: Self-pay | Admitting: Internal Medicine

## 2015-05-18 NOTE — Progress Notes (Signed)
[employer] has approved patient's Dexilant from 05/18/15-03/04/2038. Reference number: [id number].

## 2015-06-03 ENCOUNTER — Ambulatory Visit (INDEPENDENT_AMBULATORY_CARE_PROVIDER_SITE_OTHER): Payer: BLUE CROSS/BLUE SHIELD | Admitting: Family Medicine

## 2015-06-03 ENCOUNTER — Encounter: Payer: Self-pay | Admitting: Family Medicine

## 2015-06-03 VITALS — BP 120/80 | HR 96 | Temp 98.1°F | Ht 63.0 in | Wt 171.8 lb

## 2015-06-03 DIAGNOSIS — N62 Hypertrophy of breast: Secondary | ICD-10-CM | POA: Diagnosis not present

## 2015-06-03 NOTE — Patient Instructions (Addendum)
Thank you for coming in today!  I have referred you to be evaluated for breast reduction surgery. You should be contacted in the next week to schedule this. In the meantime, continue symptomatic treatments.   Our clinic's number is [phone]. Feel free to call any time with questions or concerns. We will answer any questions after hours with our 24-hour emergency line at that number as well.   - Dr. Bonner Puna

## 2015-06-03 NOTE — Assessment & Plan Note (Signed)
Causing significant muscle strain/pain chronically not thus far responsive to conservative management including weight loss, NSAIDs, heating pad, massage therapy. Will refer for evaluation of breast reduction surgery and continue symptomatic management.

## 2015-06-03 NOTE — Progress Notes (Signed)
Subjective: Christina Allen is a 40 y.o. female presenting for back pain.   She reports severe upper back pain that has been gradually worsening for the past 2 years, radiating into shoulders bilaterally, symmetrically. It is constant, nonradiating, aching, improved when recumbent, worse with walking and most movements including exercise. Her bra is cutting into shoulders, she's tried multiple bras including sports bras to try to help with this without improvement. She wears a 109 F bra. She's taking ibuprofen rarely which helps only a very little bit, and massage therapy offered very limited benefit. She's lost some weight without improvement.   - ROS: As above, and no arm pain, numbness, weakness, paresthesias.  - Former smoker  Objective: BP 120/80 mmHg  Pulse 96  Temp(Src) 98.1 F (36.7 C) (Oral)  Ht 5\' 3"  (1.6 m)  Wt 171 lb 12.8 oz (77.928 kg)  BMI 30.44 kg/m2 Gen: Well-appearing 40 y.o. female in no distress Back: Indentation underlying bra straps. Spine with normal alignment and no deformity. No tenderness to vertebral process palpation. Upper thoracic, lower cervical paraspinous muscles are tender and without spasm. Range of motion is full at neck and lumbar sacral regions.  Assessment/Plan: Christina Allen is a 40 y.o. female here for back pain related to macromastia.  - Present for years not amenable to symptomatic therapies. She may be a candidate for breast reduction surgery and would like to pursue this option. A referral will be made for further evaluation and management.

## 2015-07-19 ENCOUNTER — Telehealth: Payer: Self-pay | Admitting: *Deleted

## 2015-07-19 DIAGNOSIS — N62 Hypertrophy of breast: Secondary | ICD-10-CM

## 2015-07-19 DIAGNOSIS — M546 Pain in thoracic spine: Secondary | ICD-10-CM | POA: Insufficient documentation

## 2015-07-19 NOTE — Telephone Encounter (Signed)
Angie from Dr. Sherilyn Dacosta office called stating that  Christina Allen requires physical therapy before a breast reduction surgery.  Patient will need a referral placed for physical therapy.  Please give Angie a call with questions at [phone].  Derl Barrow, RN

## 2015-07-19 NOTE — Telephone Encounter (Signed)
PT ordered.

## 2015-08-08 ENCOUNTER — Ambulatory Visit: Payer: BLUE CROSS/BLUE SHIELD | Attending: Family Medicine | Admitting: Physical Therapy

## 2015-08-08 ENCOUNTER — Ambulatory Visit: Payer: BLUE CROSS/BLUE SHIELD | Admitting: Physical Therapy

## 2015-08-08 ENCOUNTER — Encounter: Payer: Self-pay | Admitting: Physical Therapy

## 2015-08-08 DIAGNOSIS — M542 Cervicalgia: Secondary | ICD-10-CM

## 2015-08-08 DIAGNOSIS — M546 Pain in thoracic spine: Secondary | ICD-10-CM | POA: Diagnosis not present

## 2015-08-08 DIAGNOSIS — M6281 Muscle weakness (generalized): Secondary | ICD-10-CM | POA: Diagnosis present

## 2015-08-08 NOTE — Therapy (Signed)
[place], [place], [address] Phone: [phone]   Fax:  [phone]  Physical Therapy Evaluation  Patient Details  Name: Christina Allen MRN: [id number] Date of Birth: [birth year] Referring Provider: Patrecia Pour  Encounter Date: 08/08/2015      PT End of Session - 08/08/15 1626    Visit Number 1   Number of Visits 17   Date for PT Re-Evaluation 10/07/15   Authorization Type [employer]   PT Start Time 1548   PT Stop Time 1635   PT Time Calculation (min) 47 min   Activity Tolerance Patient tolerated treatment well   Behavior During Therapy [facility] for tasks assessed/performed      Past Medical History  Diagnosis Date  . GERD (gastroesophageal reflux disease)   . H/O varicella   . Trichomonas   . Leukorrhea 04/08/2007    Physiologic  . Abnormal Pap smear 2004  . Yeast infection   . Bacterial infection   . Preterm labor   . Endometriosis   . Pemphigus vulgaris   . Anal fissure   . Dysmenorrhea   . Menorrhagia   . Vaginitis   . Allergy     Past Surgical History  Procedure Laterality Date  . Other surgical history      tubal ligation and c-section  . Wisdom tooth extraction    . Cesarean section    . Dilation and curettage of uterus    . Tubal ligation      There were no vitals filed for this visit.       Subjective Assessment - 08/08/15 1551    Subjective Off and on for a while. Aching sensation across upper back. Laying down helps a little. Reports neck pain as well, denies LBP. Reports HA pain behind eyes a couple of times per week, occasional pain around ear.     How long can you sit comfortably? <1 hour   Patient Stated Goals sitting for longer periods, standing for longer periods (keep up with kids), jogging/exercising   Currently in Pain? Yes   Pain Score 8    Pain Location Back   Pain Orientation Upper;Right;Left   Pain Descriptors / Indicators Aching   Pain Type Chronic pain   Pain  Onset More than a month ago   Pain Frequency Constant   Aggravating Factors  long term positioning   Pain Relieving Factors laying down, medications, heat            [facility] PT Assessment - 08/08/15 0001    Assessment   Medical Diagnosis thoracic pain   Referring Provider Vance Gather B   Hand Dominance Right   Next MD Visit 09/2015   Prior Therapy no   Precautions   Precautions None   Restrictions   Weight Bearing Restrictions No   Balance Screen   Has the patient fallen in the past 6 months No   [place] residence   Living Arrangements Children   Type of Home Apartment   Prior Function   Level of Independence Independent   Cognition   Overall Cognitive Status Within Functional Limits for tasks assessed   ROM / Strength   AROM / PROM / Strength Strength;AROM   AROM   Overall AROM Comments [facility]   Strength   Strength Assessment Site Shoulder   Right/Left Shoulder Right;Left   Right Shoulder Flexion 4-/5   Right Shoulder Extension 4/5   Right Shoulder ABduction 4-/5  Right Shoulder Internal Rotation 4+/5   Right Shoulder External Rotation 3+/5   Right Shoulder Horizontal ABduction 4-/5   Left Shoulder Flexion 4-/5   Left Shoulder Extension 4/5   Left Shoulder ABduction 4-/5   Left Shoulder Internal Rotation 4+/5   Left Shoulder External Rotation 3+/5   Left Shoulder Horizontal ABduction 4/5   Palpation   Spinal mobility limited in thoracic region, limited rib mobility   Palpation comment TTP rhomboids, UT, levator bilat                   [facility] Adult PT Treatment/Exercise - 08/08/15 0001    Exercises   Exercises Shoulder   Shoulder Exercises: Supine   Horizontal ABduction 15 reps   Theraband Level (Shoulder Horizontal ABduction) Level 2 (Red)   External Rotation 15 reps   Theraband Level (Shoulder External Rotation) Level 2 (Red)   Flexion 10 reps   Theraband Level (Shoulder Flexion) Level 2 (Red)   Flexion  Limitations inhalation at end range flexion   Shoulder Exercises: Seated   Retraction 20 reps   Modalities   Modalities Electrical Stimulation   Electrical Stimulation   Electrical Stimulation Location thoracic   Electrical Stimulation Action IFC   Electrical Stimulation Parameters L6, 10 min   Electrical Stimulation Goals Pain   Manual Therapy   Manual therapy comments trigger point release bilat UT & rhomboids; Thoracic PA; rib cage ER                PT Education - 08/08/15 1629    Education provided Yes   Education Details anatomy of condition, POC, HEP, bra wear   Person(s) Educated Patient   Methods Explanation;Demonstration;Tactile cues;Verbal cues   Comprehension Verbalized understanding;Returned demonstration;Verbal cues required;Tactile cues required;Need further instruction          PT Short Term Goals - 08/08/15 1741    PT SHORT TERM GOAL #1   Title decrease average pain 6/10 or less by 7/7   Time 4   Period Weeks   Status New   PT SHORT TERM GOAL #2   Title external rotation strength to at least 4-/5   Time 4   Period Weeks   Status New           PT Long Term Goals - 08/08/15 1742    PT LONG TERM GOAL #1   Title Pt will report average pain <=4/10  to decrease detrimental effects of pain on daily activities by 8/4   Time 8   Period Weeks   Status New   PT LONG TERM GOAL #2   Title Pt will report ability to sit for at least one hour without limitations by pain in order to perform work duties   Time 8   Period Weeks   Status New   PT LONG TERM GOAL #3   Title Pt will be able to perform [place]ondo with minimal limitations by pain   Time 8   Period Weeks   Status New   PT LONG TERM GOAL #4   Title pt will be able to return to prior exercise program without limitations by pain   Time 8   Period Weeks   Status New               Plan - 08/08/15 1627    Clinical Impression Statement Pt demo decreased periscapular strength as well as poor  posture through thoracic spine. Reports she does a lot of sitting for work but is  not able to sit for extended periods without standing to move around. Discussed bra wear- look for racer back or sport style in order to avoid straps pulling shoulders into IR.  Pt will benefit from skilled PT in order to improve periscapular strength and endurance to provide postural support to thoracic region and decrease pain.    Rehab Potential Good   PT Frequency 2x / week   PT Duration 8 weeks   PT Treatment/Interventions ADLs/Self Care Home Management;Cryotherapy;Electrical Stimulation;Ultrasound;Traction;Moist Heat;Iontophoresis 4mg /ml Dexamethasone;Functional mobility training;Therapeutic activities;Therapeutic exercise;Patient/family education;Neuromuscular re-education;Manual techniques;Taping;Dry needling;Passive range of motion   PT Next Visit Plan thoracic mob, periscapular strenghthening mostly in supine/prone due to HA tendency, FOTO**   PT Home Exercise Plan supine horiz abd, supine bilat ER, supine horiz abd+flx (all RTB), scapular retractions   Consulted and Agree with Plan of Care Patient      Patient will benefit from skilled therapeutic intervention in order to improve the following deficits and impairments:  Pain, Postural dysfunction, Decreased activity tolerance, Decreased strength  Visit Diagnosis: Pain in thoracic spine - Plan: PT plan of care cert/re-cert  Cervicalgia - Plan: PT plan of care cert/re-cert  Muscle weakness (generalized) - Plan: PT plan of care cert/re-cert     Problem List Patient Active Problem List   Diagnosis Date Noted  . Thoracic back pain 07/19/2015  . Macromastia 06/03/2015  . Abnormal kidney function study 09/23/2014  . Hyperlipidemia 05/01/2014  . Obesity 04/30/2014  . Allergic rhinitis 01/25/2012  . GERD (gastroesophageal reflux disease) 01/25/2012  . Irritable bowel syndrome 01/25/2012  . Pemphigus vulgaris 01/25/2012  . History of tobacco abuse  01/25/2012  . History of B12 deficiency 07/25/2011  . Abnormal finding on thyroid function test 05/29/2010  . Vitamin D deficiency 05/29/2010   Dyna Figuereo C. Euel Castile PT, DPT 08/08/2015 5:52 PM   [place] [facility] [address] [place], [place], [address] Phone: [phone]   Fax:  [phone]  Name: FATEMA GOHEEN MRN: [id number] Date of Birth: 1975-08-09

## 2015-08-22 ENCOUNTER — Ambulatory Visit: Payer: BLUE CROSS/BLUE SHIELD | Admitting: Physical Therapy

## 2015-08-22 DIAGNOSIS — M6281 Muscle weakness (generalized): Secondary | ICD-10-CM

## 2015-08-22 DIAGNOSIS — M546 Pain in thoracic spine: Secondary | ICD-10-CM | POA: Diagnosis not present

## 2015-08-22 DIAGNOSIS — M542 Cervicalgia: Secondary | ICD-10-CM

## 2015-08-22 IMAGING — CR DG CHEST 2V
2 series · 2 of 2 positions shown · non-contrast
Comparison: None.

CLINICAL DATA: Sternal chest pain for 2-3 weeks.

EXAM:
CHEST  2 VIEW

[w chest pa]
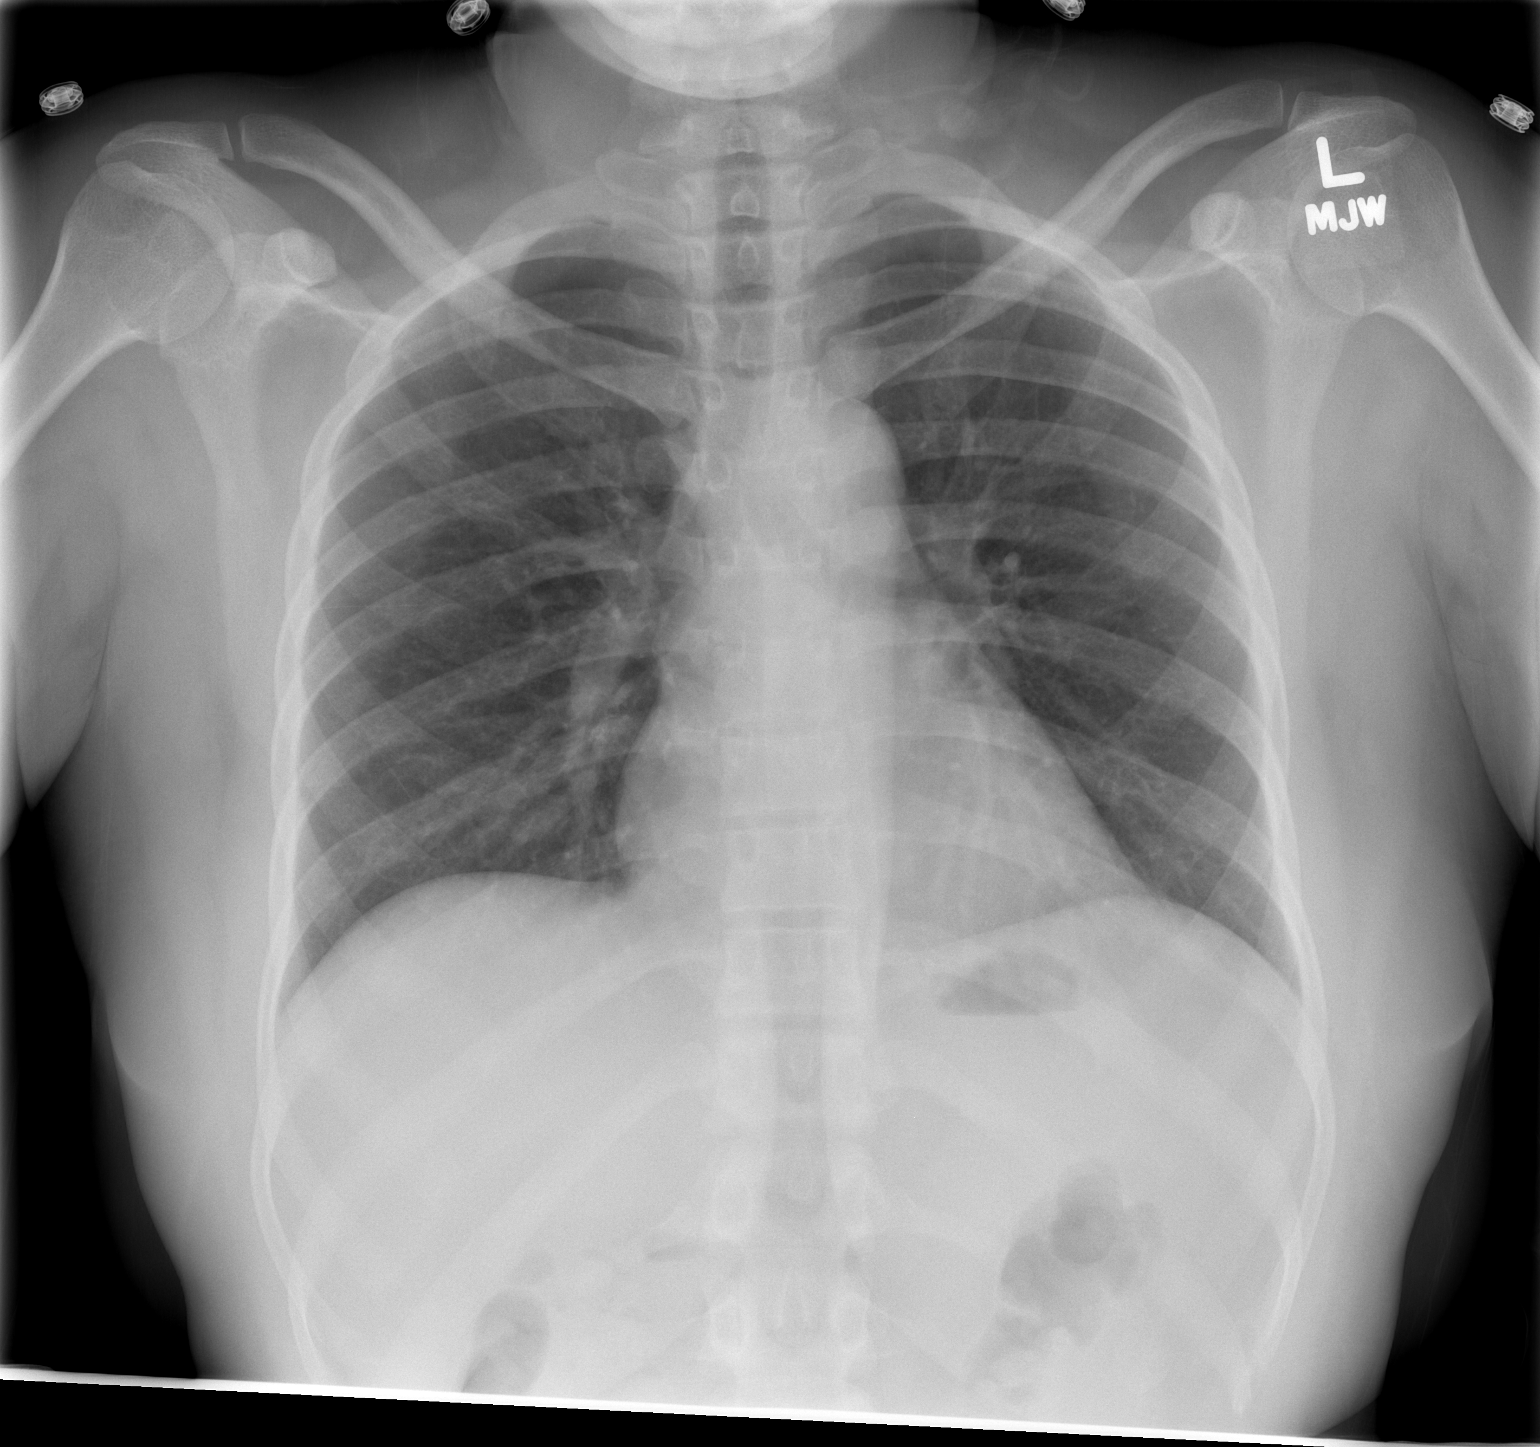

[w chest lat]
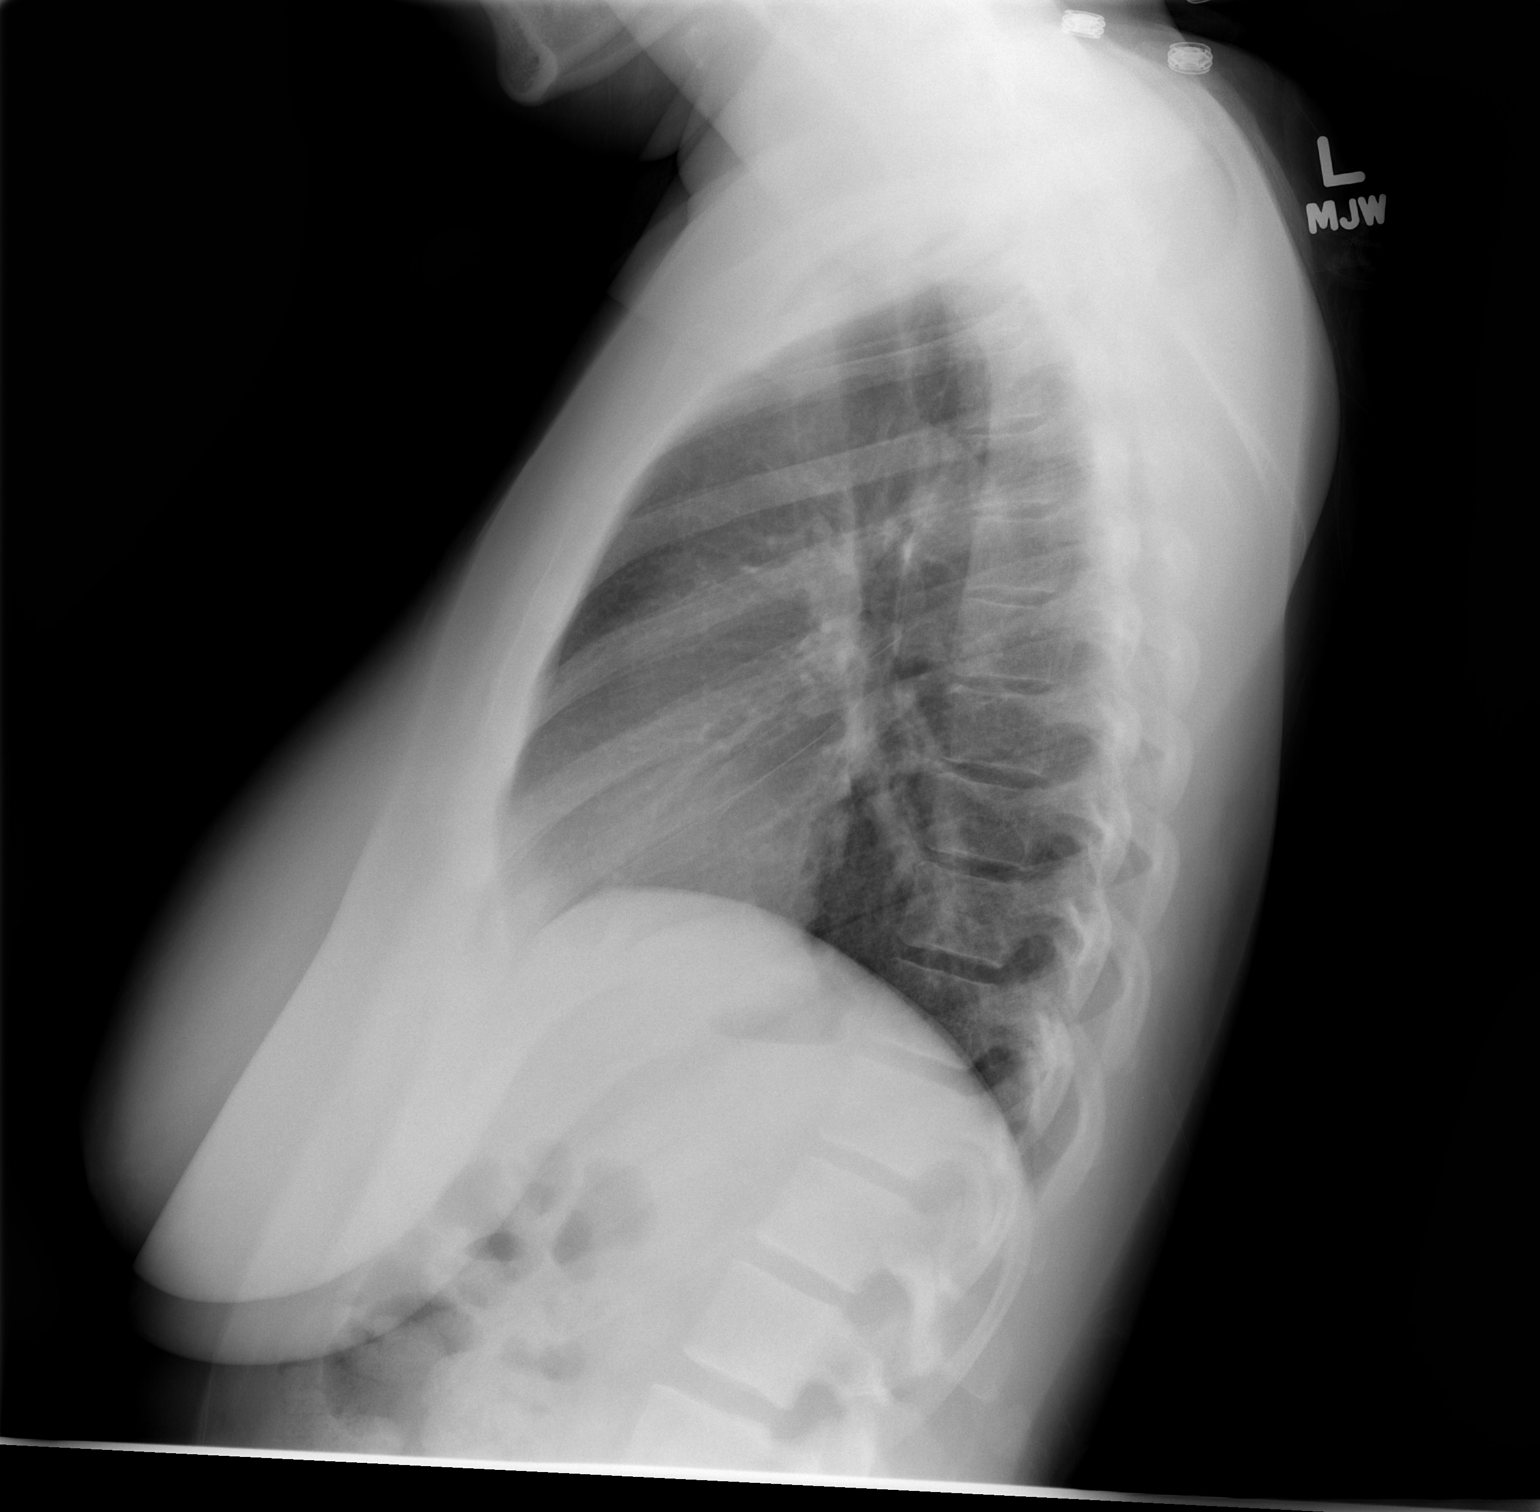

[2 of 2 positions shown; findings below may reference images not displayed]

FINDINGS: The heart size and mediastinal contours are within normal limits.
Both lungs are clear. The visualized skeletal structures are
unremarkable.
IMPRESSION: Normal chest radiographs.

## 2015-08-23 NOTE — Therapy (Signed)
[place], [place], [address] Phone: [phone]   Fax:  [phone]  Physical Therapy Treatment  Patient Details  Name: Christina Allen MRN: [id number] Date of Birth: February 13, 1976 Referring Provider: Patrecia Pour  Encounter Date: 08/22/2015      PT End of Session - 08/23/15 0922    Visit Number 2   Number of Visits 17   Date for PT Re-Evaluation 10/07/15   Authorization Type BCBS   PT Start Time 1630   PT Stop Time 1720   PT Time Calculation (min) 50 min   Activity Tolerance Patient tolerated treatment well   Behavior During Therapy [facility] for tasks assessed/performed      Past Medical History  Diagnosis Date  . GERD (gastroesophageal reflux disease)   . H/O varicella   . Trichomonas   . Leukorrhea 04/08/2007    Physiologic  . Abnormal Pap smear 2004  . Yeast infection   . Bacterial infection   . Preterm labor   . Endometriosis   . Pemphigus vulgaris   . Anal fissure   . Dysmenorrhea   . Menorrhagia   . Vaginitis   . Allergy     Past Surgical History  Procedure Laterality Date  . Other surgical history      tubal ligation and c-section  . Wisdom tooth extraction    . Cesarean section    . Dilation and curettage of uterus    . Tubal ligation      There were no vitals filed for this visit.      Subjective Assessment - 08/23/15 0919    Subjective Patient reports it is maybe a little better. She has been doing her exercises. Her pain is about a 6/10 today.    Limitations House hold activities   How long can you sit comfortably? <1 hour   Patient Stated Goals sitting for longer periods, standing for longer periods (keep up with kids), jogging/exercising   Currently in Pain? Yes   Pain Score 6    Pain Location Back   Pain Orientation Left;Right;Upper   Pain Descriptors / Indicators Aching   Pain Type Chronic pain   Pain Onset More than a month ago   Pain Frequency Constant   Aggravating Factors  long term postitioning    Pain Relieving Factors laying down, medications    Effect of Pain on Daily Activities pain with daily activity.                          [facility] Adult PT Treatment/Exercise - 08/23/15 0001    Shoulder Exercises: Supine   Horizontal ABduction 15 reps   Theraband Level (Shoulder Horizontal ABduction) Level 2 (Red)   External Rotation 15 reps   Theraband Level (Shoulder External Rotation) Level 2 (Red)   Flexion 10 reps   Theraband Level (Shoulder Flexion) Level 2 (Red)   Other Supine Exercises Supine d2 flexion    Shoulder Exercises: Prone   Retraction Limitations 2x10 2lb    Extension Limitations 2x10 2lb    Modalities   Modalities Moist Heat   Moist Heat Therapy   Number Minutes Moist Heat 10 Minutes   Moist Heat Location Other (comment)  upper thoracic area    Manual Therapy   Manual therapy comments trigger point release bilat UT & rhomboids; Thoracic PA; rib cage ER; Sub occipital release  PT Education - 08/23/15 [phone]    Education provided Yes   Education Details add new exercises if pain doesnt increase significantly   Person(s) Educated Patient   Methods Demonstration;Explanation;Verbal cues   Comprehension Verbalized understanding;Returned demonstration;Tactile cues required;Verbal cues required          PT Short Term Goals - 08/08/15 1741    PT SHORT TERM GOAL #1   Title decrease average pain 6/10 or less by 7/7   Time 4   Period Weeks   Status New   PT SHORT TERM GOAL #2   Title external rotation strength to at least 4-/5   Time 4   Period Weeks   Status New           PT Long Term Goals - 08/08/15 1742    PT LONG TERM GOAL #1   Title Pt will report average pain <=4/10  to decrease detrimental effects of pain on daily activities by 8/4   Time 8   Period Weeks   Status New   PT LONG TERM GOAL #2   Title Pt will report ability to sit for at least one hour without  limitations by pain in order to perform work duties   Time 8   Period Weeks   Status New   PT LONG TERM GOAL #3   Title Pt will be able to perform [place] with minimal limitations by pain   Time 8   Period Weeks   Status New   PT LONG TERM GOAL #4   Title pt will be able to return to prior exercise program without limitations by pain   Time 8   Period Weeks   Status New               Plan - 08/23/15 1157    Clinical Impression Statement Patient had spasming in the upper back. She was tender to palpation. She tolerated exercises well. She was advised to perfrom new exercises if she was not too sore. Continue to progress as tolerated.    PT Frequency 2x / week   PT Duration 8 weeks   PT Treatment/Interventions ADLs/Self Care Home Management;Cryotherapy;Electrical Stimulation;Ultrasound;Traction;Moist Heat;Iontophoresis 4mg /ml Dexamethasone;Functional mobility training;Therapeutic activities;Therapeutic exercise;Patient/family education;Neuromuscular re-education;Manual techniques;Taping;Dry needling;Passive range of motion   PT Next Visit Plan thoracic mob, periscapular strenghthening mostly in supine/prone due to HA tendency, FOTO**   PT Home Exercise Plan supine horiz abd, supine bilat ER, supine horiz abd+flx (all RTB), scapular retractions   Consulted and Agree with Plan of Care Patient      Patient will benefit from skilled therapeutic intervention in order to improve the following deficits and impairments:  Pain, Postural dysfunction, Decreased activity tolerance, Decreased strength  Visit Diagnosis: Pain in thoracic spine  Cervicalgia  Muscle weakness (generalized)     Problem List Patient Active Problem List   Diagnosis Date Noted  . Thoracic back pain 07/19/2015  . Macromastia 06/03/2015  . Abnormal kidney function study 09/23/2014  . Hyperlipidemia 05/01/2014  . Obesity 04/30/2014  . Allergic rhinitis 01/25/2012  . GERD (gastroesophageal reflux disease)  01/25/2012  . Irritable bowel syndrome 01/25/2012  . Pemphigus vulgaris 01/25/2012  . History of tobacco abuse 01/25/2012  . History of B12 deficiency 07/25/2011  . Abnormal finding on thyroid function test 05/29/2010  . Vitamin D deficiency 05/29/2010    Carney Living PT DPT  08/23/2015, 12:47 PM  [facility] [address] [place], [place], [address] Phone: [phone]   Fax:  [phone]  Name: Christina Allen MRN: [id number] Date of Birth: 1976-02-25

## 2015-08-25 ENCOUNTER — Ambulatory Visit: Payer: BLUE CROSS/BLUE SHIELD | Admitting: Physical Therapy

## 2015-08-29 ENCOUNTER — Ambulatory Visit: Payer: BLUE CROSS/BLUE SHIELD | Admitting: Physical Therapy

## 2015-08-31 ENCOUNTER — Ambulatory Visit: Payer: BLUE CROSS/BLUE SHIELD | Admitting: Physical Therapy

## 2015-08-31 DIAGNOSIS — M6281 Muscle weakness (generalized): Secondary | ICD-10-CM

## 2015-08-31 DIAGNOSIS — M542 Cervicalgia: Secondary | ICD-10-CM

## 2015-08-31 DIAGNOSIS — M546 Pain in thoracic spine: Secondary | ICD-10-CM | POA: Diagnosis not present

## 2015-09-01 NOTE — Therapy (Addendum)
[place], [place], [address] Phone: [phone]   Fax:  [phone]  Physical Therapy Treatment  Patient Details  Name: Christina Allen MRN: [id number] Date of Birth: 09/16/1975 Referring Provider: Patrecia Pour  Encounter Date: 08/31/2015      PT End of Session - 09/01/15 0827    Visit Number 3   Number of Visits 17   Date for PT Re-Evaluation 10/07/15   Authorization Type BCBS   PT Start Time 1219   PT Stop Time 1730   PT Time Calculation (min) 51 min   Activity Tolerance Patient tolerated treatment well   Behavior During Therapy [facility] for tasks assessed/performed      Past Medical History  Diagnosis Date  . GERD (gastroesophageal reflux disease)   . H/O varicella   . Trichomonas   . Leukorrhea 04/08/2007    Physiologic  . Abnormal Pap smear 2004  . Yeast infection   . Bacterial infection   . Preterm labor   . Endometriosis   . Pemphigus vulgaris   . Anal fissure   . Dysmenorrhea   . Menorrhagia   . Vaginitis   . Allergy     Past Surgical History  Procedure Laterality Date  . Other surgical history      tubal ligation and c-section  . Wisdom tooth extraction    . Cesarean section    . Dilation and curettage of uterus    . Tubal ligation      There were no vitals filed for this visit.      Subjective Assessment - 09/01/15 0808    Subjective Patient reports that sarting this morning she has been in near 9/10 pain in her neck and shoulders. She can not remember doing anything. She reports the pain has been bad for the past few days but today is the worst.    Limitations House hold activities   How long can you sit comfortably? <1 hour   Patient Stated Goals sitting for longer periods, standing for longer periods (keep up with kids), jogging/exercising   Currently in Pain? Yes   Pain Score 9    Pain Location Back   Pain Orientation Right;Left;Upper   Pain Descriptors / Indicators  Aching   Pain Type Chronic pain   Pain Onset More than a month ago   Pain Frequency Constant   Aggravating Factors  long term positioning    Pain Relieving Factors laying down, medication    Multiple Pain Sites No                         [facility] Adult PT Treatment/Exercise - 09/01/15 0001    Shoulder Exercises: Supine   Horizontal ABduction 15 reps   Theraband Level (Shoulder Horizontal ABduction) Level 1 (Yellow)   External Rotation 15 reps   Theraband Level (Shoulder External Rotation) Level 2 (Red);Level 1 (Yellow)   Flexion 10 reps   Theraband Level (Shoulder Flexion) Level 1 (Yellow)   Electrical Stimulation   Electrical Stimulation Location thoracic   Electrical Stimulation Action IFC   Electrical Stimulation Parameters 55mn   Electrical Stimulation Goals Pain   Manual Therapy   Manual therapy comments trigger point release bilat UT & rhomboids; Thoracic PA; rib gross rib cage springing, sub occipital release                 PT Education - 09/01/15 0826    Education Details improtance of  exercise and stretching    Person(s) Educated Patient   Methods Demonstration;Explanation;Verbal cues   Comprehension Verbalized understanding;Returned demonstration          PT Short Term Goals - 09/01/15 0852    PT SHORT TERM GOAL #1   Title decrease average pain 6/10 or less by 7/7   Baseline 9/10 pain    Time 4   Period Weeks   Status On-going   PT SHORT TERM GOAL #2   Title external rotation strength to at least 4-/5   Baseline continued difficulty with exercises    Time 4   Period Weeks   Status On-going           PT Long Term Goals - 09/01/15 5573    PT LONG TERM GOAL #1   Title Pt will report average pain <=4/10  to decrease detrimental effects of pain on daily activities by 8/4   Time 8   Period Weeks   Status On-going   PT LONG TERM GOAL #2   Title Pt will report ability to sit for at least one hour without limitations by pain in  order to perform work duties   Time 8   Status On-going   PT LONG TERM GOAL #3   Title Pt will be able to perform [place] with minimal limitations by pain   Time 8   Period Weeks   PT LONG TERM GOAL #4   Title pt will be able to return to prior exercise program without limitations by pain   Time 8   Period Weeks   Status On-going               Plan - 09/01/15 0850    Clinical Impression Statement Patient had low tolerance to exercises and manual therapy. She reported her pain was a little better after the heat and stim. She was encouraged to continue with her exercises at home.    Rehab Potential Good   PT Frequency 2x / week   PT Duration 8 weeks   PT Treatment/Interventions ADLs/Self Care Home Management;Cryotherapy;Electrical Stimulation;Ultrasound;Traction;Moist Heat;Iontophoresis 24m/ml Dexamethasone;Functional mobility training;Therapeutic activities;Therapeutic exercise;Patient/family education;Neuromuscular re-education;Manual techniques;Taping;Dry needling;Passive range of motion   PT Next Visit Plan thoracic mob, periscapular strenghthening mostly in supine/prone due to HA tendency, FOTO**   PT Home Exercise Plan supine horiz abd, supine bilat ER, supine horiz abd+flx (all RTB), scapular retractions      Patient will benefit from skilled therapeutic intervention in order to improve the following deficits and impairments:  Pain, Postural dysfunction, Decreased activity tolerance, Decreased strength  Visit Diagnosis: Pain in thoracic spine  Cervicalgia  Muscle weakness (generalized)  PHYSICAL THERAPY DISCHARGE SUMMARY  Visits from Start of Care: *3  Current functional level related to goals / functional outcomes: Did not return for follow up    Remaining deficits: Unknown did not return for follow up   Education / Equipment: Did not return for follow up  Plan: Patient agrees to discharge.  Patient goals were not met. Patient is being discharged due to not  returning since the last visit.  ?????        Problem List Patient Active Problem List   Diagnosis Date Noted  . Thoracic back pain 07/19/2015  . Macromastia 06/03/2015  . Abnormal kidney function study 09/23/2014  . Hyperlipidemia 05/01/2014  . Obesity 04/30/2014  . Allergic rhinitis 01/25/2012  . GERD (gastroesophageal reflux disease) 01/25/2012  . Irritable bowel syndrome 01/25/2012  . Pemphigus vulgaris 01/25/2012  . History of  tobacco abuse 01/25/2012  . History of B12 deficiency 07/25/2011  . Abnormal finding on thyroid function test 05/29/2010  . Vitamin D deficiency 05/29/2010    Carney Living PT DPT  09/01/2015, 5:39 PM  [facility] [address] [place], [place], [address] Phone: [phone]   Fax:  [phone]  Name: Christina Allen MRN: [id number] Date of Birth: [birth year]

## 2015-09-12 ENCOUNTER — Encounter: Payer: BLUE CROSS/BLUE SHIELD | Admitting: Physical Therapy

## 2015-09-13 ENCOUNTER — Ambulatory Visit: Payer: BLUE CROSS/BLUE SHIELD | Attending: Family Medicine | Admitting: Physical Therapy

## 2015-09-14 ENCOUNTER — Encounter: Payer: BLUE CROSS/BLUE SHIELD | Admitting: Physical Therapy

## 2015-09-19 ENCOUNTER — Encounter: Payer: BLUE CROSS/BLUE SHIELD | Admitting: Physical Therapy

## 2015-09-21 ENCOUNTER — Encounter: Payer: BLUE CROSS/BLUE SHIELD | Admitting: Physical Therapy

## 2015-11-04 ENCOUNTER — Telehealth: Payer: Self-pay | Admitting: Student

## 2015-11-04 NOTE — Telephone Encounter (Signed)
Pt called back and Christina Allen set her up for an appointment for 11/17/15. Christina Allen, Christina Allen, [place]

## 2015-11-04 NOTE — Telephone Encounter (Signed)
Patient's Mother asks PCP to complete Job Form. Please, follow up.

## 2015-11-04 NOTE — Telephone Encounter (Signed)
Tried to contact pt and LVM for her to call back to inform her that she will need to make an appointment for a physical in order for this form to be completed.  Form has been placed back into white team folder until that appointment is scheduled.  Her last visit was in March of 2017 but was not a physical and the last visit before that was July of 16 and that was not a physical either.  If pt calls back please assist her in getting this scheduled. Katharina Caper, Yacqub Baston D, [place]

## 2015-11-06 IMAGING — CR DG CHEST 2V
2 series · 2 of 2 positions shown · non-contrast
Comparison: Two-view chest 06/25/2013

CLINICAL DATA: GENERALIZED BODY ACHES CHILLS CHEST PAIN

EXAM:
CHEST  2 VIEW

[w chest pa]
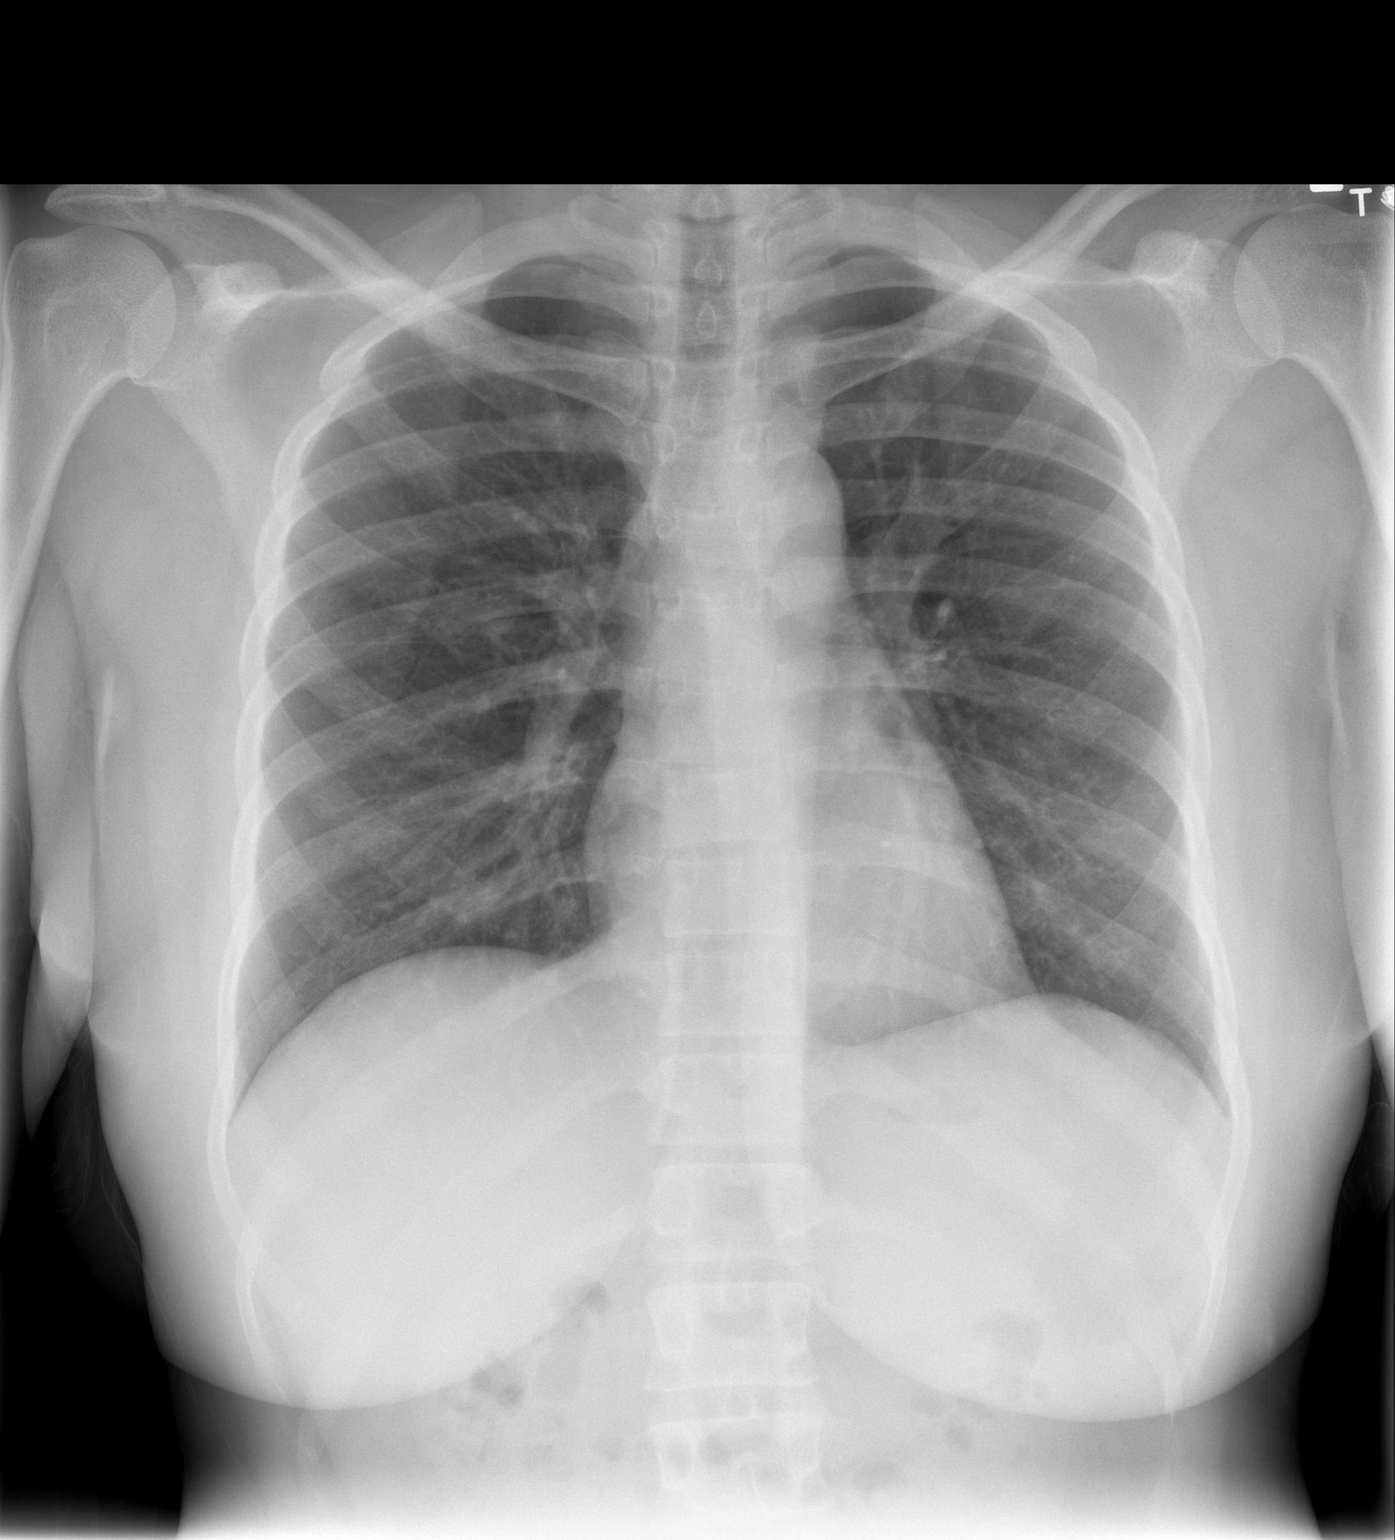

[w chest lat]
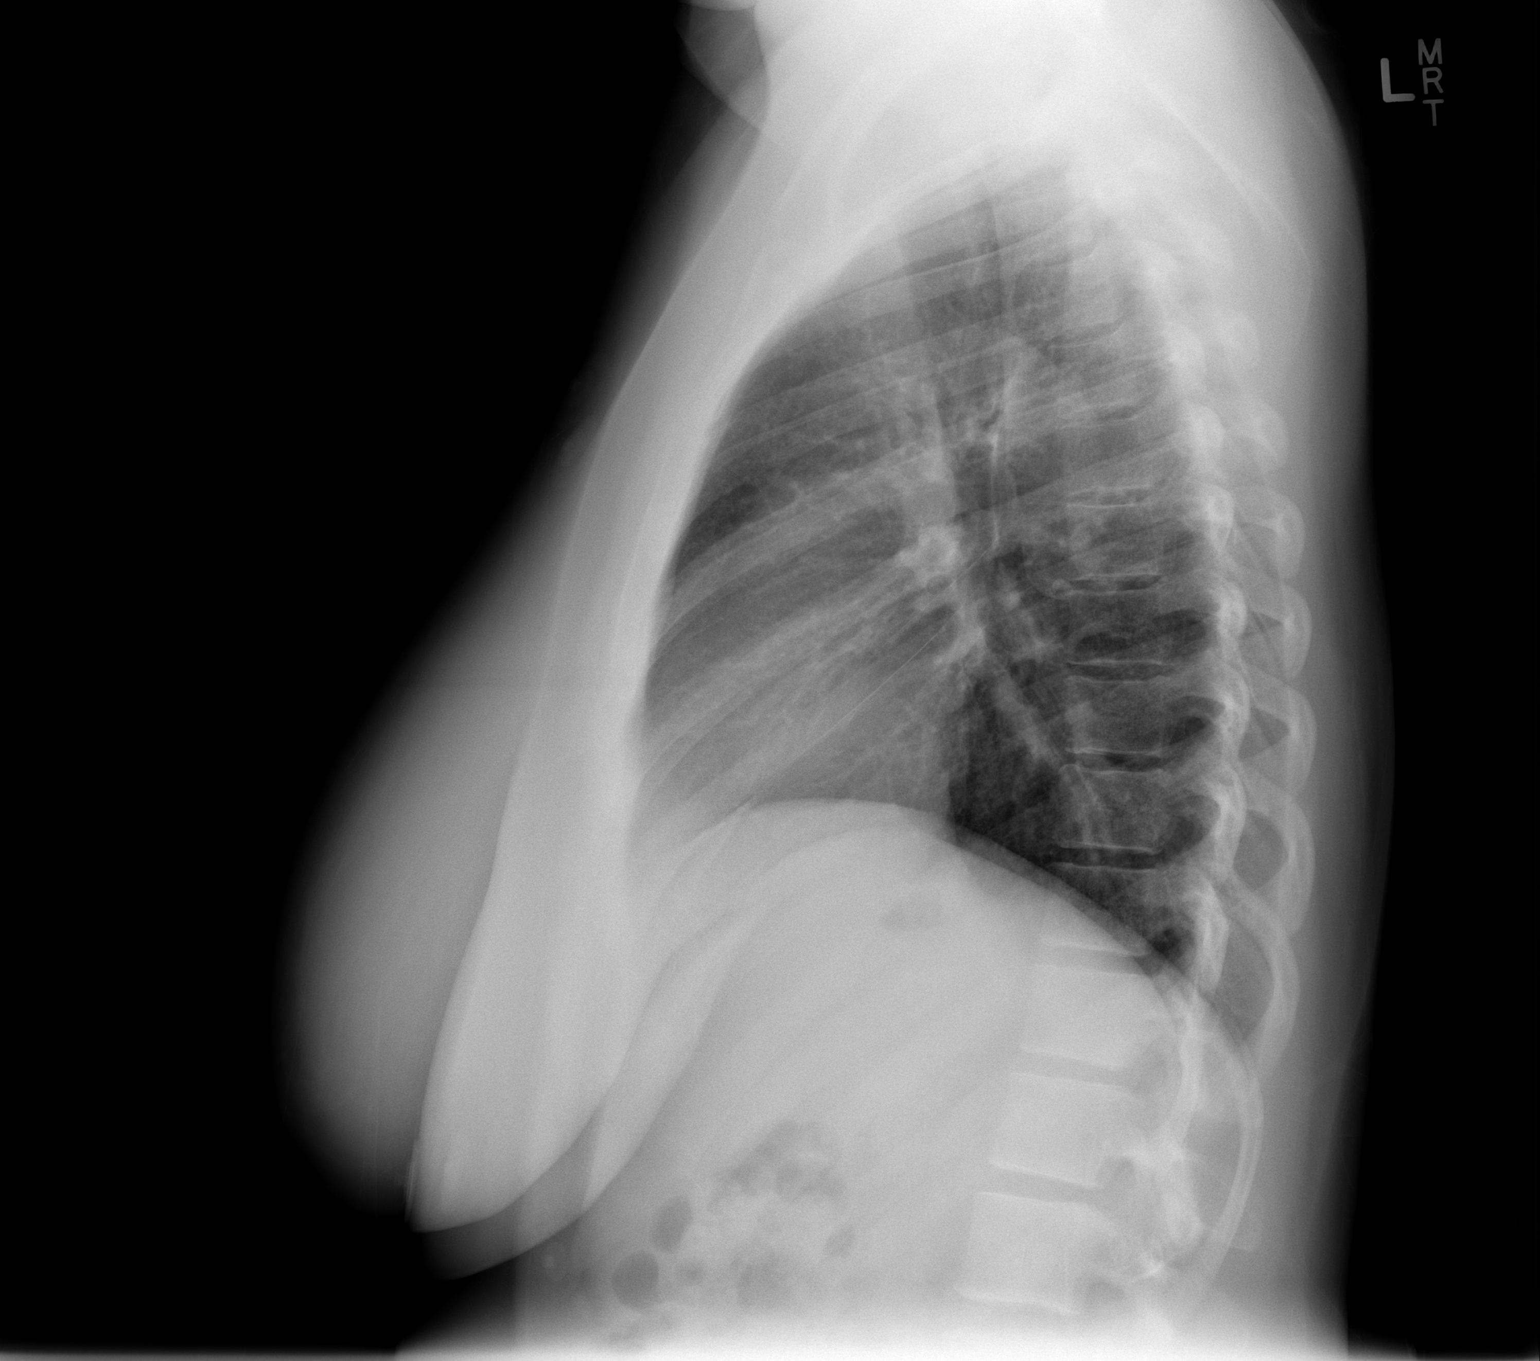

[2 of 2 positions shown; findings below may reference images not displayed]

FINDINGS: The heart size and mediastinal contours are within normal limits.
Both lungs are clear. The visualized skeletal structures are
unremarkable.
IMPRESSION: No active cardiopulmonary disease.

## 2015-11-17 ENCOUNTER — Telehealth: Payer: Self-pay

## 2015-11-17 ENCOUNTER — Encounter: Payer: Self-pay | Admitting: Student

## 2015-11-17 ENCOUNTER — Other Ambulatory Visit (HOSPITAL_COMMUNITY)
Admission: RE | Admit: 2015-11-17 | Discharge: 2015-11-17 | Disposition: A | Discharge status: Home / Self Care | Payer: BLUE CROSS/BLUE SHIELD | Source: Ambulatory Visit | Attending: Family Medicine | Admitting: Family Medicine

## 2015-11-17 ENCOUNTER — Ambulatory Visit (INDEPENDENT_AMBULATORY_CARE_PROVIDER_SITE_OTHER): Payer: BLUE CROSS/BLUE SHIELD | Admitting: Student

## 2015-11-17 VITALS — BP 106/75 | HR 76 | Temp 97.8°F | Wt 174.2 lb

## 2015-11-17 DIAGNOSIS — Z1151 Encounter for screening for human papillomavirus (HPV): Secondary | ICD-10-CM | POA: Insufficient documentation

## 2015-11-17 DIAGNOSIS — N760 Acute vaginitis: Secondary | ICD-10-CM | POA: Insufficient documentation

## 2015-11-17 DIAGNOSIS — E559 Vitamin D deficiency, unspecified: Secondary | ICD-10-CM

## 2015-11-17 DIAGNOSIS — K588 Other irritable bowel syndrome: Secondary | ICD-10-CM | POA: Diagnosis not present

## 2015-11-17 DIAGNOSIS — Z23 Encounter for immunization: Secondary | ICD-10-CM | POA: Diagnosis not present

## 2015-11-17 DIAGNOSIS — Z114 Encounter for screening for human immunodeficiency virus [HIV]: Secondary | ICD-10-CM

## 2015-11-17 DIAGNOSIS — Z8639 Personal history of other endocrine, nutritional and metabolic disease: Secondary | ICD-10-CM

## 2015-11-17 DIAGNOSIS — Z01419 Encounter for gynecological examination (general) (routine) without abnormal findings: Secondary | ICD-10-CM | POA: Diagnosis present

## 2015-11-17 DIAGNOSIS — E669 Obesity, unspecified: Secondary | ICD-10-CM

## 2015-11-17 DIAGNOSIS — Z113 Encounter for screening for infections with a predominantly sexual mode of transmission: Secondary | ICD-10-CM | POA: Diagnosis present

## 2015-11-17 DIAGNOSIS — J302 Other seasonal allergic rhinitis: Secondary | ICD-10-CM

## 2015-11-17 DIAGNOSIS — Z0000 Encounter for general adult medical examination without abnormal findings: Secondary | ICD-10-CM | POA: Diagnosis not present

## 2015-11-17 DIAGNOSIS — K219 Gastro-esophageal reflux disease without esophagitis: Secondary | ICD-10-CM

## 2015-11-17 DIAGNOSIS — N393 Stress incontinence (female) (male): Secondary | ICD-10-CM

## 2015-11-17 LAB — VITAMIN B12: Vitamin B-12: 819 pg/mL (ref 200–1100)

## 2015-11-17 MED ORDER — LUBIPROSTONE 8 MCG PO CAPS: 8.0000 ug | ORAL_CAPSULE | Freq: Two times a day (BID) | ORAL | 3 refills | Status: DC

## 2015-11-17 MED ORDER — LORATADINE 10 MG PO TABS: 10.0000 mg | ORAL_TABLET | Freq: Every day | ORAL | 2 refills | Status: DC

## 2015-11-17 MED ORDER — MONTELUKAST SODIUM 10 MG PO TABS: 10.0000 mg | ORAL_TABLET | Freq: Every day | ORAL | 11 refills | Status: DC

## 2015-11-17 MED ORDER — ALBUTEROL SULFATE HFA 108 (90 BASE) MCG/ACT IN AERS: 2.0000 | INHALATION_SPRAY | Freq: Four times a day (QID) | RESPIRATORY_TRACT | 0 refills | Status: AC | PRN

## 2015-11-17 MED ORDER — RANITIDINE HCL 150 MG PO TABS: 150.0000 mg | ORAL_TABLET | Freq: Two times a day (BID) | ORAL | 0 refills | Status: DC

## 2015-11-17 MED ORDER — MOMETASONE FUROATE 50 MCG/ACT NA SUSP: NASAL | 5 refills | Status: DC

## 2015-11-17 NOTE — Assessment & Plan Note (Addendum)
Has been under excellent for long time. Discontinued excellent today. I will give her prescription for Zantac to minimize risk of rebound. Also gave a handout about dietary changes to minimize her symptoms.

## 2015-11-17 NOTE — Assessment & Plan Note (Signed)
We will check vitamin D level today

## 2015-11-17 NOTE — Progress Notes (Signed)
Subjective:    Patient ID: Christina Allen, female    DOB: [birth year], 40 y.o.   MRN: [id number]  CC:   HPI  Patient here for annual physical. She also has a concern about a urinary leak.  Urine leak: This has been going on for 6 months. Leak with cough and sneezing. Also sharp pain right before she goes to bathroom. Denies dysuria, hematuria, flank pain or fever. She has 3 children, 2 of them vaginal delivery. Denies history of STI. Denies history of urinary tract infection.   Screening Colon Cancer: Denies family history of colon cancer Breast Cancer: Dad's mother and sister.  Cervical cancer: Denies history of abnormal Pap smear Skin Cancer: History of acne vulgaris  Infection STI: not interested. Hasn't been sexually active for over a year  Psych/Social Depression: 0 on PHQ2 EtOH abuse: Never Tobacco use: Quit in 2005 Drug use: marijuana as a teenager Work: med Designer, multimedia at [employer] Exercise: goes gym almost everyday. Hasn't been there in a month since she started working Diet: at home most of the time. Labs her Veg and fruits Sexual activity: more than a year.  Birth control: Nexplanon placed about three-four years ago. Placed at d/t practice.   Immunizations  Influenza: annually  Hep B series for work  Review of Systems  Constitutional: Negative.  Negative for appetite change, fever and unexpected weight change.  HENT: Negative for dental problem, hearing loss, sore throat and trouble swallowing.   Eyes: Negative for visual disturbance.  Respiratory: Negative for cough, chest tightness, shortness of breath and wheezing.   Cardiovascular: Negative for chest pain, palpitations and leg swelling.  Gastrointestinal: Negative for abdominal pain, blood in stool and constipation.  Endocrine: Negative for cold intolerance and heat intolerance.  Genitourinary: Negative for dyspareunia, dysuria, genital sores, hematuria and vaginal bleeding.  Musculoskeletal: Negative for arthralgias,  joint swelling and myalgias.  Skin: Negative for rash.  Neurological: Negative for weakness, numbness and headaches.  Hematological: Negative for adenopathy. Does not bruise/bleed easily.  Psychiatric/Behavioral: Negative for dysphoric mood and sleep disturbance. The patient is not nervous/anxious.    Objective:   Physical Exam Vitals:   11/17/15 1000  BP: 106/75  Pulse: 76  Temp: 97.8 F (36.6 C)  TempSrc: Oral  Weight: 174 lb 3.2 oz (79 kg)    General: Alert and oriented. Pleasant and cooperative. Well-nourished and well-developed.  Head: Normocephalic and atraumatic. Eyes: Without icterus, sclera clear and conjunctiva pink.  Ears: Normal auditory acuity. TMs and ear canals normal Cardiovascular: S1, S2 present without murmurs. Extremities without clubbing or edema. Respiratory: Clear to auscultation bilaterally. No wheezes, rales, or rhonchi. No distress.  Gastrointestinal: +BS, soft, non-tender and non-distended. No HSM noted. No guarding or rebound. No masses appreciated.  Genitourinary: External genitalia without any lesion, speculum exam remarkable for cervical stenosis otherwise no discharge or apparent lesion. Bimanual exam within normal limit.  Musculoskalatal: Symmetrical without gross deformities. Skin: Intact without significant lesions or rashes. Neurologic: Alert and oriented appropriately; grossly normal. Psych: Alert and cooperative. Normal mood and affect. Heme/Lymph/Immune: No cervical lymphadenopathy    Assessment & Plan:  Annual physical exam Pap smear, HIV and flu vaccine today. She also got the third of a hepatitis B vaccine series for her work.   Vitamin D deficiency We will check vitamin D level today  History of B12 deficiency Patient chronically on PPI which could be contributing to this. Discontinued PPI today. We'll check a vitamin B12 level today.   Stress incontinence in  female Patient's symptoms suggestive for stress  incontinence. Pelvic exam without a sign of prolapse. Gave handout for Kegel exercises.  GERD (gastroesophageal reflux disease) Has been under excellent for long time. Discontinued excellent today. I will give her prescription for Zantac to minimize risk of rebound. Also gave a handout about dietary changes to minimize her symptoms.

## 2015-11-17 NOTE — Patient Instructions (Addendum)
It was great seeing you today! We have addressed the following issues today  1. Allergy: Refilled a prescription 2. Reflux: I have stopped your Dexilant. I recommend trying dietary changes as below.  3. Screening for cervical cancer: We have done Pap smear 4.   Urine leak: This is likely stress incontinence. See about pelvic muscle exercises below    If we did any lab work today, and the results require attention, either me or my nurse will get in touch with you. If everything is normal, you will get a letter in mail. If you don't hear from [place] in two weeks, please give [place] a call. Otherwise, I look forward to talking with you again at our next visit. If you have any questions or concerns before then, please call the clinic at [phone].  Please bring all your medications to every doctors visit   Sign up for My Chart to have easy access to your labs results, and communication with your Primary care physician.    Please check-out at the front desk before leaving the clinic.      Take Care,   Kegel Exercises The goal of Kegel exercises is to isolate and exercise your pelvic floor muscles. These muscles act as a hammock that supports the rectum, vagina, small intestine, and uterus. As the muscles weaken, the hammock sags and these organs are displaced from their normal positions. Kegel exercises can strengthen your pelvic floor muscles and help you to improve bladder and bowel control, improve sexual response, and help reduce many problems and some discomfort during pregnancy. Kegel exercises can be done anywhere and at any time. HOW TO PERFORM KEGEL EXERCISES 1. Locate your pelvic floor muscles. To do this, squeeze (contract) the muscles that you use when you try to stop the flow of urine. You will feel a tightness in the vaginal area (women) and a tight lift in the rectal area (men and women). 2. When you begin, contract your pelvic muscles tight for 2-5 seconds, then relax them for 2-5  seconds. This is one set. Do 4-5 sets with a short pause in between. 3. Contract your pelvic muscles for 8-10 seconds, then relax them for 8-10 seconds. Do 4-5 sets. If you cannot contract your pelvic muscles for 8-10 seconds, try 5-7 seconds and work your way up to 8-10 seconds. Your goal is 4-5 sets of 10 contractions each day. Keep your stomach, buttocks, and legs relaxed during the exercises. Perform sets of both short and long contractions. Vary your positions. Perform these contractions 3-4 times per day. Perform sets while you are:   Lying in bed in the morning.  Standing at lunch.  Sitting in the late afternoon.  Lying in bed at night. You should do 40-50 contractions per day. Do not perform more Kegel exercises per day than recommended. Overexercising can cause muscle fatigue. Continue these exercises for for at least 15-20 weeks or as directed by your caregiver.   This information is not intended to replace advice given to you by your health care provider. Make sure you discuss any questions you have with your health care provider.   Document Released: 02/06/2012 Document Revised: 03/12/2014 Document Reviewed: 02/06/2012 Elsevier Interactive Patient Education 2016 [employer].   Gastroesophageal Reflux Disease, Adult Normally, food travels down the esophagus and stays in the stomach to be digested. However, when a person has gastroesophageal reflux disease (GERD), food and stomach acid move back up into the esophagus. When this happens, the esophagus becomes  sore and inflamed. Over time, GERD can create small holes (ulcers) in the lining of the esophagus.  CAUSES This condition is caused by a problem with the muscle between the esophagus and the stomach (lower esophageal sphincter, or LES). Normally, the LES muscle closes after food passes through the esophagus to the stomach. When the LES is weakened or abnormal, it does not close properly, and that allows food and stomach acid  to go back up into the esophagus. The LES can be weakened by certain dietary substances, medicines, and medical conditions, including:  Tobacco use.  Pregnancy.  Having a hiatal hernia.  Heavy alcohol use.  Certain foods and beverages, such as coffee, chocolate, onions, and peppermint. RISK FACTORS This condition is more likely to develop in:  People who have an increased body weight.  People who have connective tissue disorders.  People who use NSAID medicines. SYMPTOMS Symptoms of this condition include:  Heartburn.  Difficult or painful swallowing.  The feeling of having a lump in the throat.  Abitter taste in the mouth.  Bad breath.  Having a large amount of saliva.  Having an upset or bloated stomach.  Belching.  Chest pain.  Shortness of breath or wheezing.  Ongoing (chronic) cough or a night-time cough.  Wearing away of tooth enamel.  Weight loss. Different conditions can cause chest pain. Make sure to see your health care provider if you experience chest pain. DIAGNOSIS Your health care provider will take a medical history and perform a physical exam. To determine if you have mild or severe GERD, your health care provider may also monitor how you respond to treatment. You may also have other tests, including:  An endoscopy toexamine your stomach and esophagus with a small camera.  A test thatmeasures the acidity level in your esophagus.  A test thatmeasures how much pressure is on your esophagus.  A barium swallow or modified barium swallow to show the shape, size, and functioning of your esophagus. TREATMENT The goal of treatment is to help relieve your symptoms and to prevent complications. Treatment for this condition may vary depending on how severe your symptoms are. Your health care provider may recommend:  Changes to your diet.  Medicine.  Surgery. HOME CARE INSTRUCTIONS Diet  Follow a diet as recommended by your health care  provider. This may involve avoiding foods and drinks such as:  Coffee and tea (with or without caffeine).  Drinks that containalcohol.  Energy drinks and sports drinks.  Carbonated drinks or sodas.  Chocolate and cocoa.  Peppermint and mint flavorings.  Garlic and onions.  Horseradish.  Spicy and acidic foods, including peppers, chili powder, curry powder, vinegar, hot sauces, and barbecue sauce.  Citrus fruit juices and citrus fruits, such as oranges, lemons, and limes.  Tomato-based foods, such as red sauce, chili, salsa, and pizza with red sauce.  Fried and fatty foods, such as donuts, french fries, potato chips, and high-fat dressings.  High-fat meats, such as hot dogs and fatty cuts of red and white meats, such as rib eye steak, sausage, ham, and bacon.  High-fat dairy items, such as whole milk, butter, and cream cheese.  Eat small, frequent meals instead of large meals.  Avoid drinking large amounts of liquid with your meals.  Avoid eating meals during the 2-3 hours before bedtime.  Avoid lying down right after you eat.  Do not exercise right after you eat. General Instructions  Pay attention to any changes in your symptoms.  Take over-the-counter and  prescription medicines only as told by your health care provider. Do not take aspirin, ibuprofen, or other NSAIDs unless your health care provider told you to do so.  Do not use any tobacco products, including cigarettes, chewing tobacco, and e-cigarettes. If you need help quitting, ask your health care provider.  Wear loose-fitting clothing. Do not wear anything tight around your waist that causes pressure on your abdomen.  Raise (elevate) the head of your bed 6 inches (15cm).  Try to reduce your stress, such as with yoga or meditation. If you need help reducing stress, ask your health care provider.  If you are overweight, reduce your weight to an amount that is healthy for you. Ask your health care  provider for guidance about a safe weight loss goal.  Keep all follow-up visits as told by your health care provider. This is important. SEEK MEDICAL CARE IF:  You have new symptoms.  You have unexplained weight loss.  You have difficulty swallowing, or it hurts to swallow.  You have wheezing or a persistent cough.  Your symptoms do not improve with treatment.  You have a hoarse voice. SEEK IMMEDIATE MEDICAL CARE IF:  You have pain in your arms, neck, jaw, teeth, or back.  You feel sweaty, dizzy, or light-headed.  You have chest pain or shortness of breath.  You vomit and your vomit looks like blood or coffee grounds.  You faint.  Your stool is bloody or black.  You cannot swallow, drink, or eat.   This information is not intended to replace advice given to you by your health care provider. Make sure you discuss any questions you have with your health care provider.   Document Released: 11/29/2004 Document Revised: 11/10/2014 Document Reviewed: 06/16/2014 Elsevier Interactive Patient Education [employer].

## 2015-11-17 NOTE — Progress Notes (Signed)
Physical form filled out and given to pt. Ottis Stain, CMA

## 2015-11-17 NOTE — Assessment & Plan Note (Signed)
Patient chronically on PPI which could be contributing to this. Discontinued PPI today. We'll check a vitamin B12 level today.

## 2015-11-17 NOTE — Assessment & Plan Note (Signed)
Pap smear, HIV and flu vaccine today. She also got the third of a hepatitis B vaccine series for her work.

## 2015-11-17 NOTE — Assessment & Plan Note (Signed)
Patient's symptoms suggestive for stress incontinence. Pelvic exam without a sign of prolapse. Gave handout for Kegel exercises.

## 2015-11-18 LAB — CYTOLOGY - PAP

## 2015-11-18 LAB — VITAMIN D 25 HYDROXY (VIT D DEFICIENCY, FRACTURES): Vit D, 25-Hydroxy: 29 ng/mL — ABNORMAL LOW (ref 30–100)

## 2015-11-18 NOTE — Telephone Encounter (Signed)
Error Ottis Stain, CMA

## 2015-11-23 ENCOUNTER — Telehealth: Payer: Self-pay | Admitting: Student

## 2015-11-23 NOTE — Telephone Encounter (Signed)
The new medication for pts acid reflux is not working. Acid seems to just sit on her stomach and makes her sick.  She needs something that will keep her stomach acid down.  Please advise

## 2015-11-25 NOTE — Telephone Encounter (Signed)
Pt calling in again about prescribing her another medication for her acid reflux. Pt also states that she looked on her my chart and saw that her vitamin d was low but nobody called her about it or sent in any medication. Please advise. Elizbeth Posa Kennon Holter, CMA

## 2015-11-28 ENCOUNTER — Other Ambulatory Visit: Payer: Self-pay | Admitting: Student

## 2015-11-28 DIAGNOSIS — K219 Gastro-esophageal reflux disease without esophagitis: Secondary | ICD-10-CM

## 2015-11-28 MED ORDER — DEXLANSOPRAZOLE 30 MG PO CPDR: 30.0000 mg | DELAYED_RELEASE_CAPSULE | Freq: Every day | ORAL | 0 refills | Status: DC

## 2015-11-28 NOTE — Progress Notes (Signed)
Refilled her Dexillant. No improvement in her symptoms with Zantac

## 2015-11-29 NOTE — Telephone Encounter (Signed)
Called patient to discuss about her lab results from her recent visits. Her vitamin D is on the low side. Advised her to take over-the-counter vitamin D 2000 international units daily. She also have a concern about a reflux. . I have discussed with the patient about the long-term side effects of PPI (Dexillant) and she is worried. However, she says ranitidine and dietary changes have not helped. So I have refilled her Dexillant for 30 mg instead of 60 mg. I also advised her to continue dietary changes as I have discussed with her at her last visit. Patient voiced understanding and appreciated the call.

## 2015-11-29 NOTE — Telephone Encounter (Signed)
Patient called asking for clarification on her acid reflux medication called in.  She was under the impression that PCP didn't want her on dexlansoprazole for long term due to possible side effects with her kidneys.  She would like a call back to discuss this and make sure it is ok for her to take. Jazmin Hartsell,CMA

## 2015-12-05 IMAGING — US US ABDOMEN COMPLETE
1 series · 14 of 25 positions shown · non-contrast
Comparison: CT scan 10/01/2013.

CLINICAL DATA: Nausea, vomiting

EXAM:
ULTRASOUND ABDOMEN COMPLETE

[Series 1: us abdomen complete · 0.23mm/px · 14 of 70 slices shown]
[im 1/70]
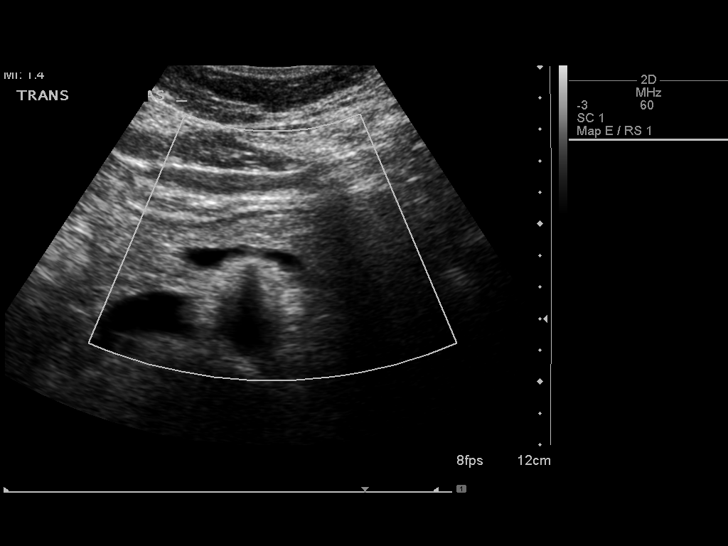
[im 6/70]
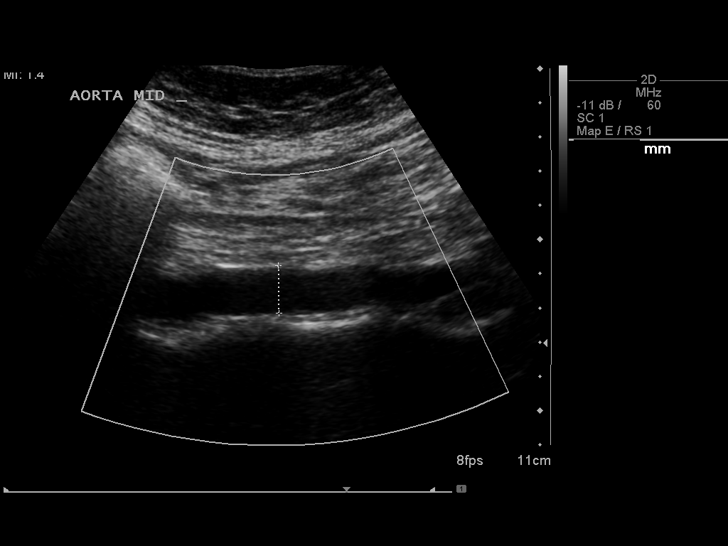
[im 12/70]
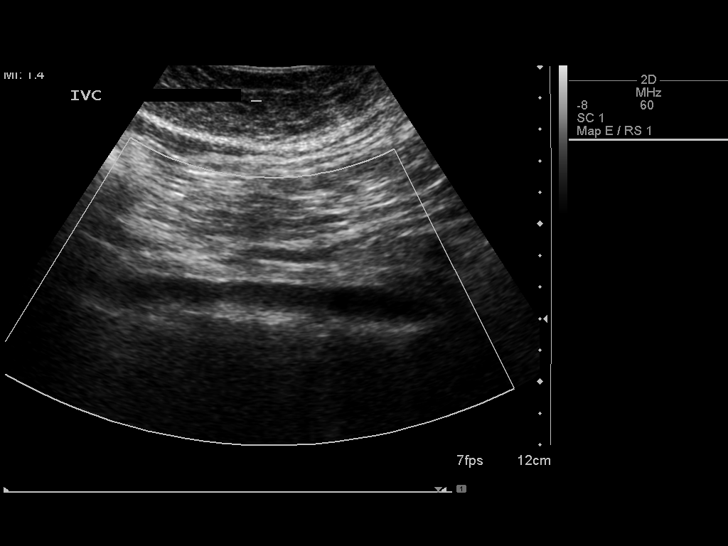
[im 18/70]
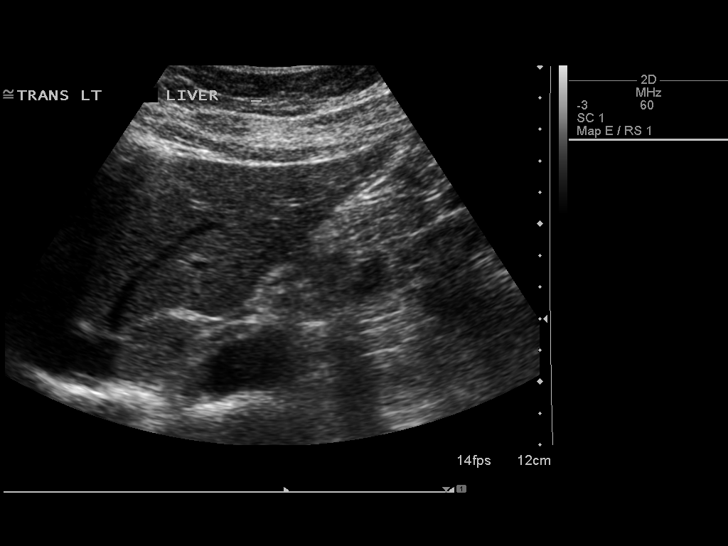
[im 24/70]
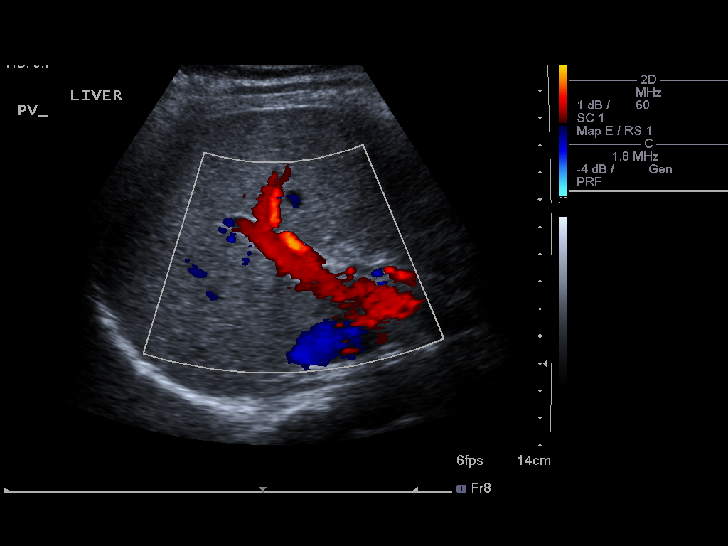
[im 26/70]
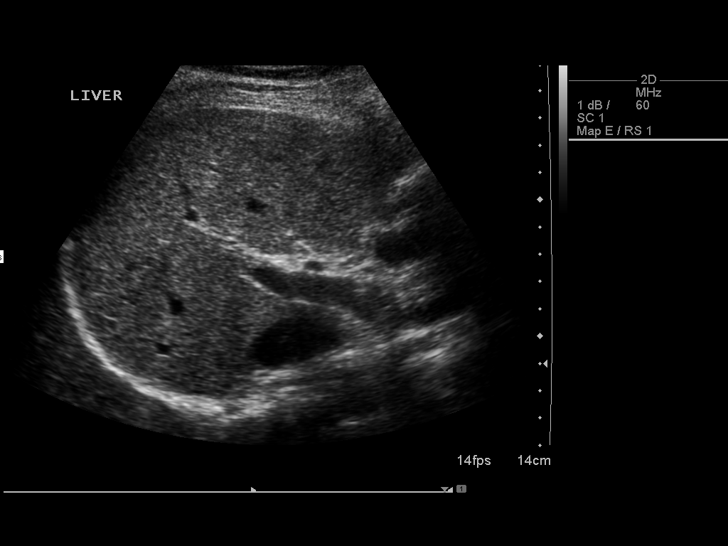
[im 32/70]
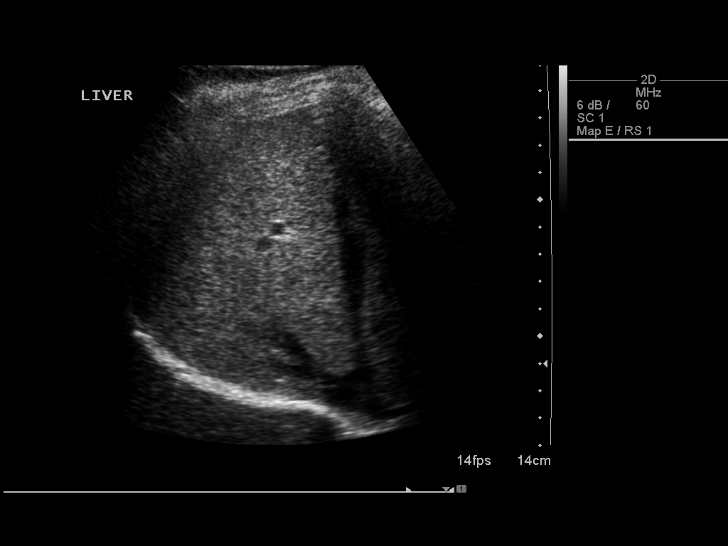
[im 38/70]
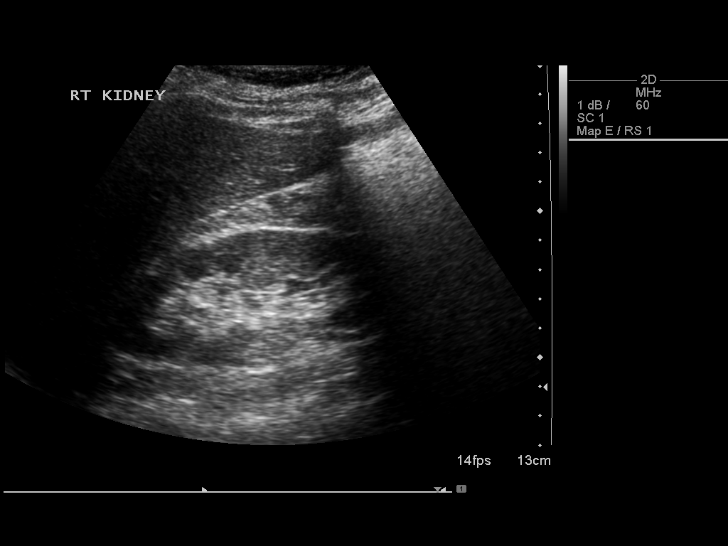
[im 44/70]
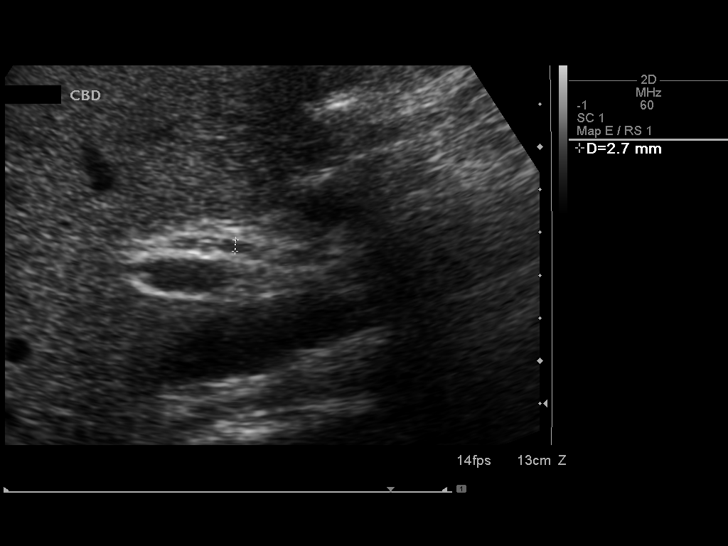
[im 47/70]
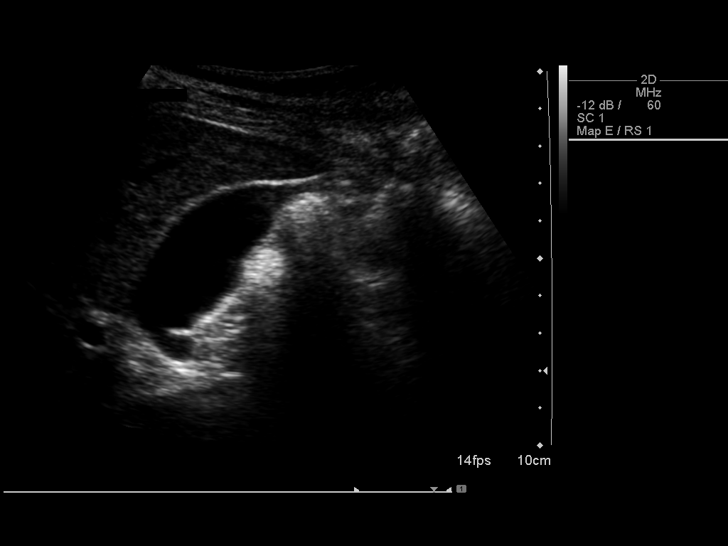
[im 52/70]
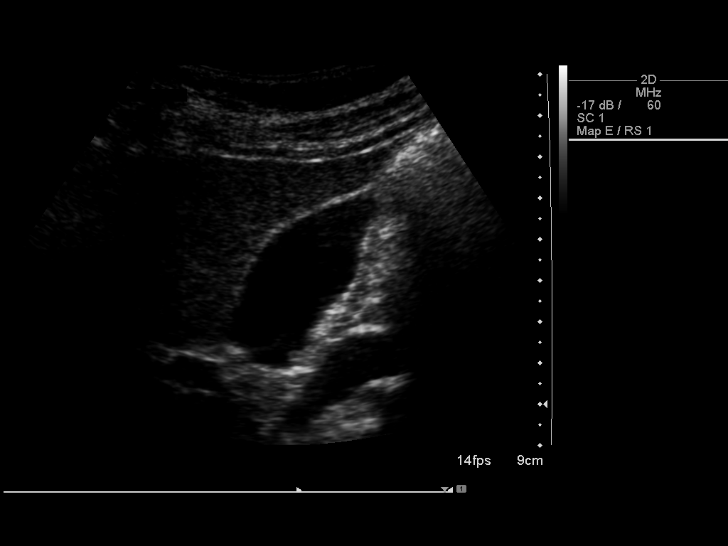
[im 58/70]
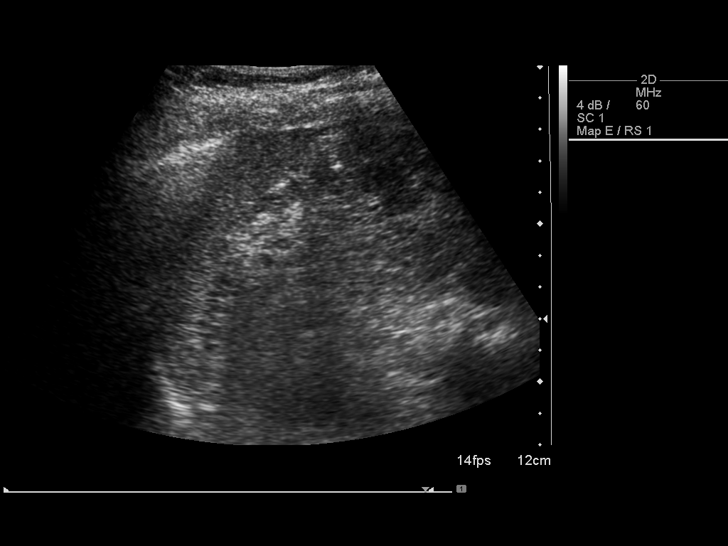
[im 64/70]
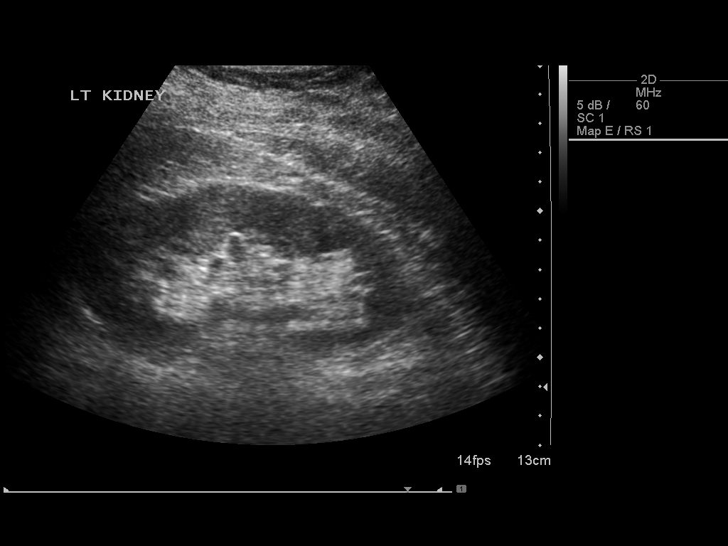
[im 70/70]
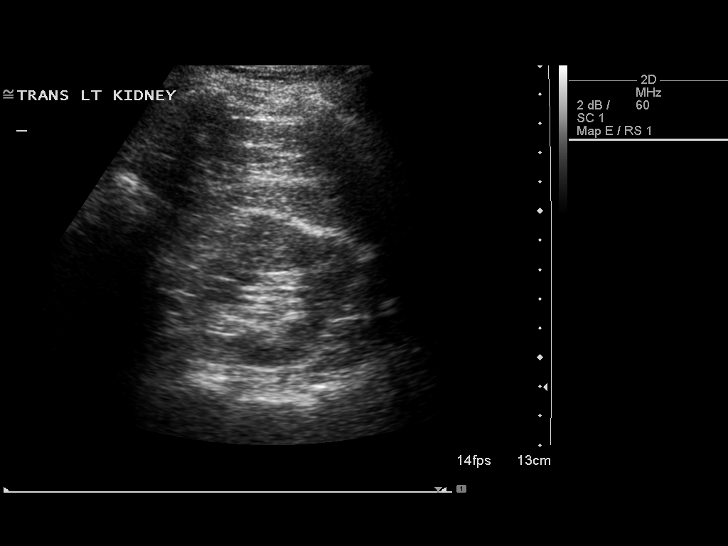

[14 of 25 positions shown; findings below may reference images not displayed]

FINDINGS: Gallbladder:

No gallstones or wall thickening visualized. No sonographic Murphy
sign noted.

Common bile duct:

Diameter: 2.7 mm in diameter

Liver:

No focal lesion identified. Within normal limits in parenchymal
echogenicity.

IVC:

No abnormality visualized.

Pancreas:

Visualized portion unremarkable.

Spleen:

Size and appearance within normal limits. Measures up to 3.3 cm in
length.

Right Kidney:

Length: 10 cm. Echogenicity within normal limits. No mass or
hydronephrosis visualized.

Left Kidney:

Length: 11.6 cm. Echogenicity within normal limits. No mass or
hydronephrosis visualized.

Abdominal aorta:

No aneurysm visualized.  Measures up to 2 cm in diameter.

Other findings:

None.
IMPRESSION: Normal abdominal ultrasound.

## 2015-12-22 ENCOUNTER — Encounter (HOSPITAL_COMMUNITY): Payer: Self-pay | Admitting: *Deleted

## 2015-12-22 ENCOUNTER — Emergency Department (HOSPITAL_COMMUNITY): Payer: BLUE CROSS/BLUE SHIELD

## 2015-12-22 ENCOUNTER — Emergency Department (HOSPITAL_COMMUNITY)
Admission: EM | Admit: 2015-12-22 | Discharge: 2015-12-23 | Disposition: A | Discharge status: Home / Self Care | Payer: BLUE CROSS/BLUE SHIELD | Attending: Emergency Medicine | Admitting: Emergency Medicine

## 2015-12-22 DIAGNOSIS — R0789 Other chest pain: Secondary | ICD-10-CM

## 2015-12-22 DIAGNOSIS — Z87891 Personal history of nicotine dependence: Secondary | ICD-10-CM | POA: Insufficient documentation

## 2015-12-22 DIAGNOSIS — K2100 Gastro-esophageal reflux disease with esophagitis, without bleeding: Secondary | ICD-10-CM

## 2015-12-22 DIAGNOSIS — G43909 Migraine, unspecified, not intractable, without status migrainosus: Secondary | ICD-10-CM | POA: Diagnosis not present

## 2015-12-22 DIAGNOSIS — R071 Chest pain on breathing: Secondary | ICD-10-CM | POA: Diagnosis present

## 2015-12-22 LAB — I-STAT TROPONIN, ED: Troponin i, poc: 0 ng/mL (ref 0.00–0.08)

## 2015-12-22 LAB — BASIC METABOLIC PANEL
Anion gap: 7 (ref 5–15)
BUN: 10 mg/dL (ref 6–20)
CO2: 24 mmol/L (ref 22–32)
Calcium: 8.8 mg/dL — ABNORMAL LOW (ref 8.9–10.3)
Chloride: 107 mmol/L (ref 101–111)
Creatinine, Ser: 0.96 mg/dL (ref 0.44–1.00)
GFR calc Af Amer: >60 mL/min (ref >60)
GFR calc non Af Amer: >60 mL/min (ref >60)
Glucose, Bld: 111 mg/dL — ABNORMAL HIGH (ref 65–99)
Potassium: 3.3 mmol/L — ABNORMAL LOW (ref 3.5–5.1)
Sodium: 138 mmol/L (ref 135–145)

## 2015-12-22 LAB — CBC
HCT: 37.1 % (ref 36.0–46.0)
Hemoglobin: 12.8 g/dL (ref 12.0–15.0)
MCH: 30.8 pg (ref 26.0–34.0)
MCHC: 34.5 g/dL (ref 30.0–36.0)
MCV: 89.4 fL (ref 78.0–100.0)
Platelets: 409 10*3/uL — ABNORMAL HIGH (ref 150–400)
RBC: 4.15 MIL/uL (ref 3.87–5.11)
RDW: 12.7 % (ref 11.5–15.5)
WBC: 6.4 10*3/uL (ref 4.0–10.5)

## 2015-12-22 LAB — LIPASE, BLOOD: Lipase: 24 U/L (ref 11–51)

## 2015-12-22 MED ORDER — DEXAMETHASONE SODIUM PHOSPHATE 10 MG/ML IJ SOLN
10.0000 mg | Freq: Once | INTRAMUSCULAR | Status: AC
  Administered 2015-12-22: 10 mg via INTRAVENOUS
  Filled 2015-12-22: qty 1

## 2015-12-22 MED ORDER — ONDANSETRON 4 MG PO TBDP
4.0000 mg | ORAL_TABLET | Freq: Once | ORAL | Status: AC
  Administered 2015-12-22: 4 mg via ORAL

## 2015-12-22 MED ORDER — SODIUM CHLORIDE 0.9 % IV BOLUS (SEPSIS)
1000.0000 mL | Freq: Once | INTRAVENOUS | Status: AC
  Administered 2015-12-22: 1000 mL via INTRAVENOUS

## 2015-12-22 MED ORDER — METOCLOPRAMIDE HCL 10 MG PO TABS: 10.0000 mg | ORAL_TABLET | Freq: Four times a day (QID) | ORAL | 0 refills | Status: DC | PRN

## 2015-12-22 MED ORDER — KETOROLAC TROMETHAMINE 30 MG/ML IJ SOLN
30.0000 mg | Freq: Once | INTRAMUSCULAR | Status: AC
  Administered 2015-12-22: 30 mg via INTRAVENOUS
  Filled 2015-12-22: qty 1

## 2015-12-22 MED ORDER — PANTOPRAZOLE SODIUM 40 MG PO TBEC
40.0000 mg | DELAYED_RELEASE_TABLET | Freq: Once | ORAL | Status: AC
  Administered 2015-12-23: 40 mg via ORAL
  Filled 2015-12-22: qty 1

## 2015-12-22 MED ORDER — DIPHENHYDRAMINE HCL 50 MG/ML IJ SOLN
25.0000 mg | Freq: Once | INTRAMUSCULAR | Status: AC
  Administered 2015-12-22: 25 mg via INTRAVENOUS
  Filled 2015-12-22: qty 1

## 2015-12-22 MED ORDER — POTASSIUM CHLORIDE CRYS ER 20 MEQ PO TBCR
40.0000 meq | EXTENDED_RELEASE_TABLET | Freq: Once | ORAL | Status: AC
  Administered 2015-12-22: 40 meq via ORAL
  Filled 2015-12-22: qty 2

## 2015-12-22 MED ORDER — METOCLOPRAMIDE HCL 5 MG/ML IJ SOLN
10.0000 mg | Freq: Once | INTRAMUSCULAR | Status: AC
  Administered 2015-12-22: 10 mg via INTRAVENOUS
  Filled 2015-12-22: qty 2

## 2015-12-22 MED ORDER — PANTOPRAZOLE SODIUM 40 MG PO TBEC: 40.0000 mg | DELAYED_RELEASE_TABLET | Freq: Every day | ORAL | 0 refills | Status: DC

## 2015-12-22 MED ORDER — GI COCKTAIL ~~LOC~~
30.0000 mL | Freq: Once | ORAL | Status: AC
  Administered 2015-12-23: 30 mL via ORAL
  Filled 2015-12-22: qty 30

## 2015-12-22 MED ORDER — ONDANSETRON 4 MG PO TBDP
ORAL_TABLET | ORAL | Status: AC
  Filled 2015-12-22: qty 1

## 2015-12-22 NOTE — ED Provider Notes (Signed)
[facility] DEPT Provider Note   CSN: [id number] Arrival date & time: 12/22/15  1722     History   Chief Complaint Chief Complaint  Patient presents with  . Chest Pain  . Nausea  . Emesis    HPI Christina Allen is a 40 y.o. female.  She complains of a three-day history of a generalized headache and midsternal chest pain. Her headache is described as piercing and she rates at 10/10. There is associated photophobia, nausea, vomiting. Yesterday, she had some slight blurring of vision. Chest pain is worse with deep breath. She denies fever, chills, sweats. She denies coughing. She denies weakness, numbness, tingling. She does not recall having previous headaches like this.   The history is provided by the patient.  Chest Pain   Associated symptoms include vomiting.  Emesis      Past Medical History:  Diagnosis Date  . Abnormal Pap smear 2004  . Allergy   . Anal fissure   . Bacterial infection   . Dysmenorrhea   . Endometriosis   . GERD (gastroesophageal reflux disease)   . H/O varicella   . Leukorrhea 04/08/2007   Physiologic  . Menorrhagia   . Pemphigus vulgaris   . Preterm labor   . Trichomonas   . Vaginitis   . Yeast infection     Patient Active Problem List   Diagnosis Date Noted  . Stress incontinence in female 11/17/2015  . Thoracic back pain 07/19/2015  . Macromastia 06/03/2015  . Obesity 04/30/2014  . Allergic rhinitis 01/25/2012  . GERD (gastroesophageal reflux disease) 01/25/2012  . Irritable bowel syndrome 01/25/2012  . Pemphigus vulgaris 01/25/2012  . History of tobacco abuse 01/25/2012  . Annual physical exam 01/25/2012  . History of B12 deficiency 07/25/2011  . Vitamin D deficiency 05/29/2010    Past Surgical History:  Procedure Laterality Date  . CESAREAN SECTION    . DILATION AND CURETTAGE OF UTERUS    . OTHER SURGICAL HISTORY     tubal ligation and c-section  . TUBAL LIGATION    . WISDOM TOOTH EXTRACTION      OB History    Gravida Para Term Preterm AB Living   3 3       3    SAB TAB Ectopic Multiple Live Births           3       Home Medications    Prior to Admission medications   Medication Sig Start Date End Date Taking? Authorizing Provider  albuterol (PROVENTIL HFA;VENTOLIN HFA) 108 (90 Base) MCG/ACT inhaler Inhale 2 puffs into the lungs every 6 (six) hours as needed for wheezing or shortness of breath. 11/17/15  Yes [name], MD  calcium-vitamin D (OSCAL WITH D) 500-200 MG-UNIT per tablet Take 1 tablet by mouth daily with breakfast. 09/23/14  Yes Patrecia Pour, MD  Dexlansoprazole 30 MG capsule Take 1 capsule (30 mg total) by mouth daily. 11/28/15  Yes [name], MD  loratadine (CLARITIN) 10 MG tablet Take 1 tablet (10 mg total) by mouth daily. 11/17/15  Yes [name], MD  lubiprostone (AMITIZA) 8 MCG capsule Take 1 capsule (8 mcg total) by mouth 2 (two) times daily with a meal. 11/17/15  Yes [name], MD  mometasone (NASONEX) 50 MCG/ACT nasal spray USE 2 SPRAYS IN EACH NOSTRIL DAILY 11/17/15  Yes [name], MD  montelukast (SINGULAIR) 10 MG tablet Take 1 tablet (10 mg total) by mouth at bedtime. 11/17/15  Yes  [name], MD  Olopatadine HCl 0.2 % SOLN Apply 1 drop to eye daily. Patient taking differently: Place 1 drop into both eyes daily.  05/18/14  Yes Timmothy Euler, MD    Family History Family History  Problem Relation Age of Onset  . COPD Maternal Grandmother     Emphysema  . Cancer Maternal Grandmother   . Cancer Maternal Aunt   . Cancer Paternal Grandmother   . Hypertension Mother   . Kidney disease Mother   . Hypertension Father   . Alcohol abuse Father   . Alcohol abuse Sister   . Alcohol abuse Brother   . Diabetes Maternal Uncle     Social History Social History  Substance Use Topics  . Smoking status: Former Smoker    Quit date: 03/06/2003  . Smokeless tobacco: Not on file  . Alcohol use No     Allergies   Shellfish-derived products   Review of  Systems Review of Systems  Cardiovascular: Positive for chest pain.  Gastrointestinal: Positive for vomiting.  All other systems reviewed and are negative.    Physical Exam Updated Vital Signs BP 123/98   Pulse 93   Temp 97.9 F (36.6 C) (Oral)   Resp 21   Ht 5\' 2"  (1.575 m)   Wt 172 lb (78 kg)   SpO2 100%   BMI 31.46 kg/m   Physical Exam  Nursing note and vitals reviewed.  40 year old female, resting comfortably and in no acute distress. Vital signs are significant for hypertension and borderline tachypnea. Oxygen saturation is 100%, which is normal. Head is normocephalic and atraumatic. PERRLA, EOMI. Oropharynx is clear. Fundi show no hemorrhage, exudate, papilledema. There is tenderness palpation over the temporalis muscle bilaterally.  Neck is nontender and supple without adenopathy or JVD. Back is nontender and there is no CVA tenderness. Lungs are clear without rales, wheezes, or rhonchi. Chest is tender over the midsternal area which does reproduce her pain. Heart has regular rate and rhythm without murmur. Abdomen is soft, flat, nontender without masses or hepatosplenomegaly and peristalsis is normoactive. Extremities have no cyanosis or edema, full range of motion is present. Skin is warm and dry without rash. Neurologic: Mental status is normal, cranial nerves are intact, there are no motor or sensory deficits.  ED Treatments / Results  Labs (all labs ordered are listed, but only abnormal results are displayed) Labs Reviewed  BASIC METABOLIC PANEL - Abnormal; Notable for the following:       Result Value   Potassium 3.3 (*)    Glucose, Bld 111 (*)    Calcium 8.8 (*)    All other components within normal limits  CBC - Abnormal; Notable for the following:    Platelets 409 (*)    All other components within normal limits  LIPASE, BLOOD  I-STAT TROPOININ, ED    EKG  EKG Interpretation  Date/Time:  Thursday December 22 2015 17:28:49 EDT Ventricular Rate:   99 PR Interval:  136 QRS Duration: 68 QT Interval:  348 QTC Calculation: 446 R Axis:   -43 Text Interpretation:  Normal sinus rhythm Left axis deviation Low voltage QRS Cannot rule out Anterior infarct , age undetermined Abnormal ECG When compared with ECG of 09/23/2014, No significant change was found Confirmed by [facility]  MD, Shadi Sessler ([id number]) on 12/22/2015 8:55:16 PM       Radiology Dg Chest 2 View  Result Date: 12/22/2015 CLINICAL DATA:  Acute onset of mid and right-sided chest pain. Initial  encounter. EXAM: CHEST  2 VIEW COMPARISON:  Chest radiograph and CTA of the chest performed 09/23/2014 FINDINGS: The lungs are well-aerated. Minimal right basilar atelectasis is noted. There is no evidence of pleural effusion or pneumothorax. The heart is normal in size; the mediastinal contour is within normal limits. No acute osseous abnormalities are seen. IMPRESSION: Minimal right basilar atelectasis noted.  Lungs otherwise clear. Electronically Signed   By: Garald Balding M.D.   On: 12/22/2015 18:09    Procedures Procedures (including critical care time)  Medications Ordered in ED Medications  ondansetron (ZOFRAN-ODT) 4 MG disintegrating tablet (not administered)  ondansetron (ZOFRAN-ODT) disintegrating tablet 4 mg (4 mg Oral Given 12/22/15 1732)     Initial Impression / Assessment and Plan / ED Course  I have reviewed the triage vital signs and the nursing notes.  Pertinent labs & imaging results that were available during my care of the patient were reviewed by me and considered in my medical decision making (see chart for details).  Clinical Course   Headache which seems most consistent with migraine. No red flags to suggest more serious causes of headache. Possible muscle contraction headache given tenderness over temporalis muscles. Chest pain appears to be chest wall pain, possibly related to straining from emesis. Old records are reviewed, and she has no relevant past visits. She will  be given a migraine cocktail of normal saline, metoclopramide, diphenhydramine, ketorolac, and dexamethasone.  She had excellent relief of her headache with above noted treatment. Chest pain was resolved but it was some residual burning which I suspect his esophagitis from repeated vomiting. She's given a dose of GI cocktail and pantoprazole and discharged with prescription for pantoprazole and metoclopramide.  Final Clinical Impressions(s) / ED Diagnoses   Final diagnoses:  Migraine without status migrainosus, not intractable, unspecified migraine type  Chest wall pain  Gastroesophageal reflux disease with esophagitis    New Prescriptions New Prescriptions   METOCLOPRAMIDE (REGLAN) 10 MG TABLET    Take 1 tablet (10 mg total) by mouth every 6 (six) hours as needed for nausea (or headache).   PANTOPRAZOLE (PROTONIX) 40 MG TABLET    Take 1 tablet (40 mg total) by mouth daily.     Delora Fuel, MD [id number]

## 2015-12-22 NOTE — ED Triage Notes (Signed)
Pt reports sharp non radiating cp, and n/v for 3 days. Pt denies fever at home. Pt was watching tv when pain started.

## 2016-02-06 ENCOUNTER — Encounter: Payer: Self-pay | Admitting: *Deleted

## 2016-02-06 ENCOUNTER — Telehealth: Payer: Self-pay | Admitting: Student

## 2016-02-06 NOTE — Telephone Encounter (Signed)
Spoke with Janett Billow and she copied and pasted the last physical to the patient in her my chart as a message. Katharina Caper, April D, [place]

## 2016-02-06 NOTE — Telephone Encounter (Signed)
Would like her latest physical sent to [facility] so she can print it from there.

## 2016-08-12 ENCOUNTER — Ambulatory Visit (HOSPITAL_COMMUNITY)
Admission: EM | Admit: 2016-08-12 | Discharge: 2016-08-12 | Discharge status: Absent without leave | Payer: BLUE CROSS/BLUE SHIELD

## 2016-11-20 ENCOUNTER — Telehealth: Payer: Self-pay | Admitting: *Deleted

## 2016-11-20 NOTE — Telephone Encounter (Signed)
Patient called requesting a copy of immunization records. Record placed up front for pickup.  Derl Barrow, RN

## 2016-12-05 ENCOUNTER — Encounter (HOSPITAL_COMMUNITY): Payer: Self-pay

## 2016-12-05 ENCOUNTER — Emergency Department (HOSPITAL_COMMUNITY)
Admission: EM | Admit: 2016-12-05 | Discharge: 2016-12-05 | Disposition: A | Discharge status: Home / Self Care | Payer: Medicaid Other | Attending: Emergency Medicine | Admitting: Emergency Medicine

## 2016-12-05 DIAGNOSIS — J329 Chronic sinusitis, unspecified: Secondary | ICD-10-CM

## 2016-12-05 DIAGNOSIS — Z8719 Personal history of other diseases of the digestive system: Secondary | ICD-10-CM | POA: Insufficient documentation

## 2016-12-05 DIAGNOSIS — R52 Pain, unspecified: Secondary | ICD-10-CM | POA: Diagnosis present

## 2016-12-05 DIAGNOSIS — Z79899 Other long term (current) drug therapy: Secondary | ICD-10-CM | POA: Insufficient documentation

## 2016-12-05 MED ORDER — AMOXICILLIN-POT CLAVULANATE 875-125 MG PO TABS: 1.0000 | ORAL_TABLET | Freq: Two times a day (BID) | ORAL | 0 refills | Status: AC

## 2016-12-05 MED ORDER — IBUPROFEN 400 MG PO TABS
600.0000 mg | ORAL_TABLET | Freq: Once | ORAL | Status: AC
  Administered 2016-12-05: 600 mg via ORAL
  Filled 2016-12-05: qty 1

## 2016-12-05 MED ORDER — ALBUTEROL SULFATE HFA 108 (90 BASE) MCG/ACT IN AERS
2.0000 | INHALATION_SPRAY | Freq: Once | RESPIRATORY_TRACT | Status: AC
  Administered 2016-12-05: 2 via RESPIRATORY_TRACT
  Filled 2016-12-05: qty 6.7

## 2016-12-05 MED ORDER — DM-GUAIFENESIN ER 30-600 MG PO TB12: 1.0000 | ORAL_TABLET | Freq: Two times a day (BID) | ORAL | 0 refills | Status: DC

## 2016-12-05 MED ORDER — IBUPROFEN 600 MG PO TABS: 600.0000 mg | ORAL_TABLET | Freq: Four times a day (QID) | ORAL | 0 refills | Status: DC | PRN

## 2016-12-05 NOTE — ED Provider Notes (Signed)
[facility] DEPT Provider Note   CSN: [id number] Arrival date & time: 12/05/16  0957     History   Chief Complaint Chief Complaint  Patient presents with  . Generalized Body Aches    HPI Christina Allen is a 41 y.o. female.  HPI  Patient is a 41 year old female with a history of allergic rhinitis and pemphigus vulgaris (last steroid treatments approx 6 months ago according to pt) presenting for a 2 day history of frontal and maxillary pain approximately 10 out of 10 in quality. Addition, patient has purulent rhinorrhea, and a right earache. Patient reports she has tried Aleve for the headache. Associated symptoms include photophobia. Patient denies sore throat, difficult to swallowing, chest pain, productive cough, or wheezing. Patient reports she feels that she has chest congestion that she cannot expectorate. Patient denies fevers at home, but reports feeling chilled. Patient reports myalgias. Patient has a history of maxillary sinusitis requiring treatment 3 months. Patient has pemphigus vulgaris which she takes steroid courses for. No other immunosuppressing conditions or medications.  Past Medical History:  Diagnosis Date  . Abnormal Pap smear 2004  . Allergy   . Anal fissure   . Bacterial infection   . Dysmenorrhea   . Endometriosis   . GERD (gastroesophageal reflux disease)   . H/O varicella   . Leukorrhea 04/08/2007   Physiologic  . Menorrhagia   . Pemphigus vulgaris   . Preterm labor   . Trichomonas   . Vaginitis   . Yeast infection     Patient Active Problem List   Diagnosis Date Noted  . Stress incontinence in female 11/17/2015  . Thoracic back pain 07/19/2015  . Macromastia 06/03/2015  . Obesity 04/30/2014  . Allergic rhinitis 01/25/2012  . GERD (gastroesophageal reflux disease) 01/25/2012  . Irritable bowel syndrome 01/25/2012  . Pemphigus vulgaris 01/25/2012  . History of tobacco abuse 01/25/2012  . Annual physical exam 01/25/2012  . History of B12  deficiency 07/25/2011  . Vitamin D deficiency 05/29/2010    Past Surgical History:  Procedure Laterality Date  . CESAREAN SECTION    . DILATION AND CURETTAGE OF UTERUS    . OTHER SURGICAL HISTORY     tubal ligation and c-section  . TUBAL LIGATION    . WISDOM TOOTH EXTRACTION      OB History    Gravida Para Term Preterm AB Living   3 3       3    SAB TAB Ectopic Multiple Live Births           3       Home Medications    Prior to Admission medications   Medication Sig Start Date End Date Taking? Authorizing Provider  albuterol (PROVENTIL HFA;VENTOLIN HFA) 108 (90 Base) MCG/ACT inhaler Inhale 2 puffs into the lungs every 6 (six) hours as needed for wheezing or shortness of breath. 11/17/15   [name], MD  calcium-vitamin D (OSCAL WITH D) 500-200 MG-UNIT per tablet Take 1 tablet by mouth daily with breakfast. 09/23/14   Patrecia Pour, MD  Dexlansoprazole 30 MG capsule Take 1 capsule (30 mg total) by mouth daily. 11/28/15   [name], MD  loratadine (CLARITIN) 10 MG tablet Take 1 tablet (10 mg total) by mouth daily. 11/17/15   [name], MD  metoCLOPramide (REGLAN) 10 MG tablet Take 1 tablet (10 mg total) by mouth every 6 (six) hours as needed for nausea (or headache). 88/50/27   Delora Fuel, MD  mometasone (NASONEX)  50 MCG/ACT nasal spray USE 2 SPRAYS IN EACH NOSTRIL DAILY 11/17/15   [name], MD  montelukast (SINGULAIR) 10 MG tablet Take 1 tablet (10 mg total) by mouth at bedtime. 11/17/15   [name], MD  Olopatadine HCl 0.2 % SOLN Apply 1 drop to eye daily. Patient taking differently: Place 1 drop into both eyes daily.  05/18/14   Timmothy Euler, MD  pantoprazole (PROTONIX) 40 MG tablet Take 1 tablet (40 mg total) by mouth daily. 38/10/17   Delora Fuel, MD    Family History Family History  Problem Relation Age of Onset  . COPD Maternal Grandmother        Emphysema  . Cancer Maternal Grandmother   . Cancer Maternal Aunt   . Cancer Paternal Grandmother     . Hypertension Mother   . Kidney disease Mother   . Hypertension Father   . Alcohol abuse Father   . Alcohol abuse Sister   . Alcohol abuse Brother   . Diabetes Maternal Uncle     Social History Social History  Substance Use Topics  . Smoking status: Former Smoker    Quit date: 03/06/2003  . Smokeless tobacco: Not on file  . Alcohol use No     Allergies   Shellfish-derived products   Review of Systems Review of Systems  Constitutional: Negative for chills and fever.  HENT: Positive for ear pain, rhinorrhea, sinus pain and sinus pressure. Negative for sore throat.   Eyes: Positive for photophobia. Negative for redness and visual disturbance.  Respiratory: Negative for cough, shortness of breath and wheezing.   Cardiovascular: Negative for chest pain and leg swelling.  Gastrointestinal: Positive for nausea. Negative for diarrhea and vomiting.  Genitourinary: Negative for dysuria.  Musculoskeletal: Positive for myalgias.     Physical Exam Updated Vital Signs BP 116/90   Pulse 91   Temp 97.9 F (36.6 C) (Oral)   Resp 18   SpO2 100%   Physical Exam  Constitutional: She appears well-developed and well-nourished. No distress.  Sitting comfortably in bed.  HENT:  Head: Normocephalic and atraumatic.  Right Ear: Tympanic membrane normal.  Left Ear: Tympanic membrane normal.  Nose: Right sinus exhibits maxillary sinus tenderness and frontal sinus tenderness. Left sinus exhibits maxillary sinus tenderness and frontal sinus tenderness.  Mouth/Throat: Oropharynx is clear and moist.  Erythematous, swollen turbinates in the nares bilaterally.  Eyes: Conjunctivae are normal. Right eye exhibits no discharge. Left eye exhibits no discharge.  EOMs normal to gross examination.  Neck: Normal range of motion.  Cardiovascular: Normal rate and regular rhythm.   Pulmonary/Chest: Effort normal and breath sounds normal. She has no wheezes. She has no rales.  Normal respiratory effort.  Patient converses comfortably. No audible wheeze or stridor.  Abdominal: She exhibits no distension.  Musculoskeletal: Normal range of motion.  Lymphadenopathy:    She has no cervical adenopathy.  Neurological: She is alert.  Cranial nerves intact to gross observation. Patient moves extremities without difficulty.  Skin: Skin is warm and dry. She is not diaphoretic.  Psychiatric: She has a normal mood and affect. Her behavior is normal. Judgment and thought content normal.  Nursing note and vitals reviewed.    ED Treatments / Results  Labs (all labs ordered are listed, but only abnormal results are displayed) Labs Reviewed - No data to display  EKG  EKG Interpretation None       Radiology No results found.  Procedures Procedures (including critical care time)  Medications  Ordered in ED Medications  albuterol (PROVENTIL HFA;VENTOLIN HFA) 108 (90 Base) MCG/ACT inhaler 2 puff (2 puffs Inhalation Given 12/05/16 1213)  ibuprofen (ADVIL,MOTRIN) tablet 600 mg (600 mg Oral Given 12/05/16 1212)     Initial Impression / Assessment and Plan / ED Course  I have reviewed the triage vital signs and the nursing notes.  Pertinent labs & imaging results that were available during my care of the patient were reviewed by me and considered in my medical decision making (see chart for details).      Final Clinical Impressions(s) / ED Diagnoses   Final diagnoses:  None   MDM  Patient with a history of bacterial sinusitis) vulgaris requiring steroids presenting with 48 hours of maxillary and sinus pain. Suspect viral sinusitis at this time. Patient is nontoxic-appearing, afebrile. Discussed with patient bacterial superinfection in the typical time course that 7-10 days. Given patient's history of severe bacterial maxillary sinusitis and immuno suppression with steroids, discussed treating with antibiotics and having patient fill prescription only if not improving in one week with  expectorants and supportive care. Inhaler prescribed adjunctive for chest congestion. Patient and family are in agreement with the plan of care.  New Prescriptions New Prescriptions   No medications on file     Tamala Julian 12/05/16 1233    Drenda Freeze, MD 12/06/16 [phone]

## 2016-12-05 NOTE — ED Notes (Signed)
PA in to see pt. 

## 2016-12-05 NOTE — Discharge Instructions (Signed)
Please see the information and instructions below regarding your visit.  Your diagnoses today include:  1. Sinusitis, unspecified chronicity, unspecified location    Your symptoms are likely due to a sinus infection. This often causes headache in the distribution you described. This infection is likely viral at this time, however you do have risk factors for developing bacterial infection. Please take the antibiotic if you are not better next week with supportive care.  Tests performed today include: See side panel of your discharge paperwork for testing performed today. Vital signs are listed at the bottom of these instructions.   Medications prescribed:    1. Please take all of your antibiotics until finished.   You may develop abdominal discomfort or nausea from the antibiotic. If this occurs, you may take it with food. Some patients also get diarrhea with antibiotics. You may help offset this with probiotics which you can buy or get in yogurt. Do not eat or take the probiotics until 2 hours after your antibiotic. Some women develop vaginal yeast infections after antibiotics. If you develop unusual vaginal discharge after being on this medication, please see your primary care provider.   Some people develop allergies to antibiotics. Symptoms of antibiotic allergy can be mild and include a flat rash and itching. They can also be more serious and include:  ?Hives - Hives are raised, red patches of skin that are usually very itchy.  ?Lip or tongue swelling  ?Trouble swallowing or breathing  ?Blistering of the skin or mouth.  If you have any of these serious symptoms, please seek emergency medical care immediately.  2. You are prescribed ibuprofen, a non-steroidal anti-inflammatory agent (NSAID) for pain. You may take 600mg  every 6 hours as needed for pain. If still requiring this medication around the clock for acute pain after 10 days, please see your primary healthcare provider.  You  may combine this medication with Tylenol, 650 mg every 6 hours, so you are receiving something for pain every 3 hours.  This is not a long-term medication unless under the care and direction of your primary provider. Taking this medication long-term and not under the supervision of a healthcare provider could increase the risk of stomach ulcers, kidney problems, and cardiovascular problems such as high blood pressure.    3. Mucinex DM. This will help with congestion.  4. Inhaler. Please take 2 puffs as needed for any respiratory symptoms. Please return to the emergency department if you have any increasing shortness of breath after 3 applications 20 minutes apart.   Take any prescribed medications only as prescribed, and any over the counter medications only as directed on the packaging.  Home care instructions:  Please follow any educational materials contained in this packet.   Follow-up instructions: Please follow-up with your primary care provider as soon as possible for further evaluation of your symptoms if they are not completely improved.    Return instructions:  Please return to the Emergency Department if you experience worsening symptoms.  Please return for any worsening headache, inability to tolerate food or drink due to nausea or vomiting, changes in vision, dizziness, lightheadedness, shortness of breath, or chest pain. Please return if you have any other emergent concerns.   Your vital signs today were: BP 116/90    Pulse 91    Temp 97.9 F (36.6 C) (Oral)    Resp 18    SpO2 100%  If your blood pressure (BP) was elevated on multiple readings during this visit above 130  for the top number or above 80 for the bottom number, please have this repeated by your primary care provider within one month. --------------  Thank you for allowing [place] to participate in your care today.

## 2016-12-05 NOTE — ED Notes (Signed)
C/O SINUS PRESSURE AND HEADACHE X 2 DAYS.

## 2016-12-05 NOTE — ED Triage Notes (Addendum)
Pt presents to the ed with complaints of body aches all over and head congestion x 2 days. Pt also complains of a productive cough that started yesterday.

## 2017-01-09 NOTE — Telephone Encounter (Signed)
done

## 2017-03-10 ENCOUNTER — Encounter (HOSPITAL_COMMUNITY): Payer: Self-pay | Admitting: Emergency Medicine

## 2017-03-10 ENCOUNTER — Ambulatory Visit (HOSPITAL_COMMUNITY)
Admission: EM | Admit: 2017-03-10 | Discharge: 2017-03-10 | Disposition: A | Discharge status: Home / Self Care | Payer: Medicaid Other | Attending: Physician Assistant | Admitting: Physician Assistant

## 2017-03-10 DIAGNOSIS — M7582 Other shoulder lesions, left shoulder: Secondary | ICD-10-CM

## 2017-03-10 DIAGNOSIS — M25512 Pain in left shoulder: Secondary | ICD-10-CM | POA: Diagnosis not present

## 2017-03-10 MED ORDER — MELOXICAM 15 MG PO TABS: 15.0000 mg | ORAL_TABLET | Freq: Every day | ORAL | 0 refills | Status: DC

## 2017-03-10 MED ORDER — ACETAMINOPHEN 500 MG PO TABS: 1000.0000 mg | ORAL_TABLET | Freq: Three times a day (TID) | ORAL | 99 refills | Status: AC | PRN

## 2017-03-10 MED ORDER — TRAMADOL HCL 50 MG PO TABS: 50.0000 mg | ORAL_TABLET | Freq: Four times a day (QID) | ORAL | 0 refills | Status: DC | PRN

## 2017-03-10 NOTE — ED Provider Notes (Signed)
03/10/2017 7:18 PM   DOB: 12-Nov-1975 / MRN: [id number]  SUBJECTIVE:  Christina Allen is a 42 y.o. female presenting for shoulder pain present for 3 weeks but worse over the last week and not improving with NSAIDs.  Pain worse when she abducts the arm. 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 She is taking 1600 mg BID of ibuprofen and still has pain.   She is allergic to shellfish-derived products.   She  has a past medical history of Abnormal Pap smear (2004), Allergy, Anal fissure, Bacterial infection, Dysmenorrhea, Endometriosis, GERD (gastroesophageal reflux disease), H/O varicella, Leukorrhea (04/08/2007), Menorrhagia, Pemphigus vulgaris, Preterm labor, Trichomonas, Vaginitis, and Yeast infection.    She  reports that she quit smoking about 14 years ago. she has never used smokeless tobacco. She reports that she does not drink alcohol or use drugs. She  reports that she does not engage in sexual activity. The patient  has a past surgical history that includes Other surgical history; Wisdom tooth extraction; Cesarean section; Dilation and curettage of uterus; and Tubal ligation.  Her family history includes Alcohol abuse in her brother, father, and sister; COPD in her maternal grandmother; Cancer in her maternal aunt, maternal grandmother, and paternal grandmother; Diabetes in her maternal uncle; Hypertension in her father and mother; Kidney disease in her mother.  Review of Systems  Respiratory: Negative for shortness of breath.   Cardiovascular: Negative for chest pain.  Musculoskeletal: Positive for joint pain. Negative for back pain, falls, myalgias and neck pain.    OBJECTIVE:  BP 112/81 (BP Location: Left Arm)   Pulse 84   Temp 98.2 F (36.8 C) (Oral)   Resp 18   SpO2 100%   Physical Exam  Constitutional: She is active.  Non-toxic appearance.  Cardiovascular: Normal rate.  Pulmonary/Chest: Effort normal. No tachypnea.  Musculoskeletal:        Left shoulder: She exhibits decreased range of motion (2/2 pain), tenderness and pain. She exhibits no bony tenderness, no swelling, no spasm, normal pulse and normal strength.  Neurological: She is alert.  Skin: Skin is warm and dry. She is not diaphoretic. No pallor.    No results found for this or any previous visit (from the past 72 hour(s)).  No results found.  ASSESSMENT AND PLAN:  Orders Placed This Encounter  Procedures  . Sling immobilizer    Standing Status:   Standing    Number of Occurrences:   1    Order Specific Question:   Laterality    Answer:   Left     Rotator cuff tendonitis, left: Sling. Will refer to ortho on call as she needs an eval given her pain is pushing through 1600 mg of iburpofen. I have cautioned her on the dangers of doing this and she will only take the medications I am prescribing.       The patient is advised to call or return to clinic if she does not see an improvement in symptoms, or to seek the care of the closest emergency department if she worsens with the above plan.   Philis Fendt, MHS, PA-C 03/10/2017 7:18 PM    Christina Coop, PA-C 03/10/17 1918

## 2017-03-10 NOTE — Discharge Instructions (Signed)
Meloxicam once daily.  1000 mg of tylenol every 8 hours.  Tramadol as needed.  Keep you arm in the sling.  Go to orthopedics for an eval

## 2017-03-10 NOTE — ED Triage Notes (Signed)
PT C/O: left shoulder pain x1 week that is getting worse.... Pain increases w/activity and unable to raise arm above head.   DENIES: inj/trauma  TAKING MEDS: Ibuprofen   A&O x4... NAD... Ambulatory

## 2017-03-21 ENCOUNTER — Other Ambulatory Visit: Payer: Self-pay

## 2017-03-21 ENCOUNTER — Ambulatory Visit: Payer: Medicaid Other | Admitting: Student

## 2017-03-21 ENCOUNTER — Encounter: Payer: Self-pay | Admitting: Student

## 2017-03-21 VITALS — BP 104/78 | HR 90 | Temp 97.7°F | Ht 63.0 in | Wt 182.0 lb

## 2017-03-21 DIAGNOSIS — Z30019 Encounter for initial prescription of contraceptives, unspecified: Secondary | ICD-10-CM

## 2017-03-21 DIAGNOSIS — R739 Hyperglycemia, unspecified: Secondary | ICD-10-CM

## 2017-03-21 DIAGNOSIS — Z3202 Encounter for pregnancy test, result negative: Secondary | ICD-10-CM | POA: Diagnosis not present

## 2017-03-21 DIAGNOSIS — Z3049 Encounter for surveillance of other contraceptives: Secondary | ICD-10-CM

## 2017-03-21 DIAGNOSIS — Z114 Encounter for screening for human immunodeficiency virus [HIV]: Secondary | ICD-10-CM | POA: Insufficient documentation

## 2017-03-21 DIAGNOSIS — N939 Abnormal uterine and vaginal bleeding, unspecified: Secondary | ICD-10-CM

## 2017-03-21 LAB — POCT GLYCOSYLATED HEMOGLOBIN (HGB A1C): Hemoglobin A1C: 5.5

## 2017-03-21 LAB — POCT URINE PREGNANCY: Preg Test, Ur: NEGATIVE

## 2017-03-21 MED ORDER — ETONOGESTREL 68 MG ~~LOC~~ IMPL
68.0000 mg | DRUG_IMPLANT | Freq: Once | SUBCUTANEOUS | Status: AC
  Administered 2017-03-21: 68 mg via SUBCUTANEOUS

## 2017-03-21 NOTE — Progress Notes (Signed)
Subjective:    Christina Allen is a 42 y.o. old female here for nexplanon removal.  HPI Nexplanon removal: this was placed over 4 years ago at [facility]. Last sexual intercourse two years ago. She says she is abstaining for religious reason. She hasn't had period since nexplanon was inserted.  She uses Nexplanon for heavy period. No history of liver disease. No history of blood disorder. Not on blood thinner.   PMH/Problem List: has Allergic rhinitis; GERD (gastroesophageal reflux disease); Irritable bowel syndrome; Pemphigus vulgaris; History of tobacco abuse; Annual physical exam; Vitamin D deficiency; History of B12 deficiency; Obesity; Macromastia; Thoracic back pain; Stress incontinence in female; and Encounter for prescription of female contraceptives on their problem list.   has a past medical history of Abnormal Pap smear (2004), Allergy, Anal fissure, Bacterial infection, Dysmenorrhea, Endometriosis, GERD (gastroesophageal reflux disease), H/O varicella, Leukorrhea (04/08/2007), Menorrhagia, Pemphigus vulgaris, Preterm labor, Trichomonas, Vaginitis, and Yeast infection.  FH:  Family History  Problem Relation Age of Onset  . COPD Maternal Grandmother        Emphysema  . Cancer Maternal Grandmother   . Cancer Maternal Aunt   . Cancer Paternal Grandmother   . Hypertension Mother   . Kidney disease Mother   . Hypertension Father   . Alcohol abuse Father   . Alcohol abuse Sister   . Alcohol abuse Brother   . Diabetes Maternal Uncle     SH Social History   Tobacco Use  . Smoking status: Former Smoker    Last attempt to quit: 03/06/2003    Years since quitting: 14.0  . Smokeless tobacco: Never Used  Substance Use Topics  . Alcohol use: No  . Drug use: No    Review of Systems Review of systems negative except for pertinent positives and negatives in history of present illness above.     Objective:     Vitals:   03/21/17 1604  BP: 104/78  Pulse: 90  Temp: 97.7 F (36.5  C)  TempSrc: Oral  SpO2: 99%  Weight: 182 lb (82.6 kg)  Height: 5\' 3"  (1.6 m)   Body mass index is 32.24 kg/m.  Physical Exam  GEN: appears well, no apparent distress. HEM: negative for cervical or periauricular lymphadenopathies CVS: RRR, nl s1 & s2, no murmurs, no edema RESP: no IWOB GI: BS present & normal, soft, NTND MSK: no focal tenderness or notable swelling.  Nexplanon palpable over the medial aspect of her left arm about 2 inches above the medial epicondyle SKIN: no apparent skin lesion NEURO: alert and oiented appropriately, no gross deficits.     Assessment and Plan:  1. Abnormal uterine bleeding (AUB): resolved with nexplanon. Nexplanon replaced successfully per patient's request. Urine pregnancy test negative. See procedure notes below  2.Encounter for prescription of female contraceptives: as above. - etonogestrel (NEXPLANON) implant 68 mg   3. Hyperglycemia: history of prediabetes. A1c down from 6.2% to 5.5% - POCT glycosylated hemoglobin (Hb A1C)  Progestin Implant Removal Note Risk including but not limited to bleeding, infection, nerve damage, bruising, difficulty with removal and scarring were discussed. Patient voiced understanding and signed a written informed consent.  Procedure confirmed by patient and team  Side: Left arm  The patient is place in the supine position. The rod is located by palpation. The area was cleaned with alcohol swab and 4 cc of 1% lidocaine was injected just underneath the end of the implant closest to the elbow. Then, area was prepped with betadine. A 2-3 mm incision  was made with a scalpel over the distal end of the implant while pressing on the proximal end. The rod is pushed to the incision site and grasped with a mosquito forceps and gently removed in its entirey. Blunt dissection was not needed. The patient did tolerate the procedure well. Hemostasis attained with gentle pressure.  Then, we proceeded to insertion procedure as  below.  Progestin Implant Insertion Note Urine pregnancy test: is negative Risk including but not limited to bleeding, infection, nerve damage, bruising and migration of the nexplanon rode to other part of the body. Patient voiced understanding and signed a written informed consent.  Site (check): left arm          The patient is placed in the supine position. Insertion site was selected 7 cm from medial epicondyle about 3 cm posterior to medial sulcus. Procedure area was cleaned with alcohol swab, and a  4 mL of 1% lidocaine was used for subcutaneous anesthesia. Anesthesia confirmed. Area was prepped with betadine.  Nexplanon  trocar was inserted subcutaneously and then Nexplanon  capsule delivered subcutaneously. Trocar was removed from the insertion site. Nexplanon  capsule was palpated by provider and patient to assure satisfactory placement.  Estimated blood loss: minimal Dressings applied and wrapped. Followup: The patient tolerated the procedure well without complications.  Standard post-procedure care wass explained and return precautions were given.  Return if symptoms worsen or fail to improve.  [name], MD 03/21/17 Pager: [phone]

## 2017-03-21 NOTE — Patient Instructions (Addendum)
It was great seeing you today! We have removed you previous Nexplanon and inserted a new one.  Keep the gauze wrap on for the next 48 hours.  Please let us know if you have bleeding, swelling, fever or other symptoms concerning to you.  If we did any lab work today, and the results require attention, either me or my nurse will get in touch with you. If everything is normal, you will get a letter in mail and a message via . If you don't hear from [place] in two weeks, please give [place] a call. Otherwise, we look forward to seeing you again at your next visit. If you have any questions or concerns before then, please call the clinic at [phone].  Please bring all your medications to every doctors visit  Sign up for My Chart to have easy access to your labs results, and communication with your Primary care physician.    Please check-out at the front desk before leaving the clinic.    Take Care,   Dr. Worthy Keeler Instructions After Insertion   Keep bandage clean and dry for 24-48 hours   May use ice/Tylenol/Ibuprofen for soreness or pain   If you develop fever, drainage or increased warmth from incision site-contact office immediately

## 2017-03-26 ENCOUNTER — Telehealth: Payer: Self-pay | Admitting: Internal Medicine

## 2017-03-26 ENCOUNTER — Encounter: Payer: Self-pay | Admitting: Student

## 2017-03-26 ENCOUNTER — Emergency Department (HOSPITAL_COMMUNITY): Payer: Medicaid Other

## 2017-03-26 ENCOUNTER — Other Ambulatory Visit: Payer: Self-pay

## 2017-03-26 ENCOUNTER — Ambulatory Visit: Payer: Medicaid Other | Admitting: Student

## 2017-03-26 ENCOUNTER — Encounter (HOSPITAL_COMMUNITY): Payer: Self-pay

## 2017-03-26 ENCOUNTER — Emergency Department (HOSPITAL_COMMUNITY)
Admission: EM | Admit: 2017-03-26 | Discharge: 2017-03-26 | Disposition: A | Discharge status: Home / Self Care | Payer: Medicaid Other | Attending: Emergency Medicine | Admitting: Emergency Medicine

## 2017-03-26 VITALS — BP 110/72 | HR 94 | Temp 98.1°F | Ht 61.0 in | Wt 182.0 lb

## 2017-03-26 DIAGNOSIS — M75102 Unspecified rotator cuff tear or rupture of left shoulder, not specified as traumatic: Secondary | ICD-10-CM | POA: Diagnosis present

## 2017-03-26 DIAGNOSIS — Z87891 Personal history of nicotine dependence: Secondary | ICD-10-CM | POA: Diagnosis not present

## 2017-03-26 DIAGNOSIS — M25512 Pain in left shoulder: Secondary | ICD-10-CM | POA: Diagnosis not present

## 2017-03-26 DIAGNOSIS — J309 Allergic rhinitis, unspecified: Secondary | ICD-10-CM | POA: Diagnosis not present

## 2017-03-26 DIAGNOSIS — B372 Candidiasis of skin and nail: Secondary | ICD-10-CM | POA: Diagnosis not present

## 2017-03-26 MED ORDER — METHYLPREDNISOLONE ACETATE 40 MG/ML IJ SUSP
40.0000 mg | Freq: Once | INTRAMUSCULAR | Status: AC
  Administered 2017-03-26: 40 mg via INTRAMUSCULAR

## 2017-03-26 MED ORDER — NYSTATIN 100000 UNIT/GM EX CREA: 1.0000 "application " | TOPICAL_CREAM | Freq: Two times a day (BID) | CUTANEOUS | 0 refills | Status: DC

## 2017-03-26 MED ORDER — FENTANYL CITRATE (PF) 100 MCG/2ML IJ SOLN
50.0000 ug | Freq: Once | INTRAMUSCULAR | Status: AC
  Administered 2017-03-26: 50 ug via INTRAMUSCULAR
  Filled 2017-03-26: qty 2

## 2017-03-26 MED ORDER — FLUTICASONE PROPIONATE 50 MCG/ACT NA SUSP: 2.0000 | Freq: Every day | NASAL | 6 refills | Status: AC

## 2017-03-26 MED ORDER — MELOXICAM 15 MG PO TABS: 15.0000 mg | ORAL_TABLET | Freq: Every day | ORAL | 0 refills | Status: DC

## 2017-03-26 MED ORDER — DIPHENHYDRAMINE HCL 25 MG PO CAPS: 25.0000 mg | ORAL_CAPSULE | Freq: Once | ORAL | Status: DC

## 2017-03-26 NOTE — Progress Notes (Signed)
Subjective:    Christina Allen is a 42 y.o. old female here left shoulder pain, sinus infection and skin rash under her breasts  HPI Left shoulder pain: this has been going on for one month. Gotten worse in the last two weeks. 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 Denies history of trauma or injury. She describes the pain as stabbing sharp when she moves her arm above her head. It is achy at rest. Reports numbness in part of her arm and all her fingers. She is not able to localize clearly. It hurts when she reaches out to get her sit belt. Went to urgent care and she was treated with meloxicam, tramadol and tylenol. These helped a little bit. Denies pain in her neck. Denies locking or clicking in her shoulder. She is right handed.  Denies smoking, drinking or recreational drug use.  Sinus: reports congestion, burning, thick greenish discharge when she blows. These has been going on for a week. She also reports epistaxis. Denies fever. Has history of allergy. She reports postnasal dripping. Has been taking mucinex and Zyrtec.  Skin rash under her breast: this is a chronic issue.  She reports erythema, irritation and itching under both breasts whenever she is sweaty. Denies new bra, soap, detergent or recent antibiotics use. She reports history of large breast. She denies discharge. Pain PMH/Problem List: has Allergic rhinitis; GERD (gastroesophageal reflux disease); Irritable bowel syndrome; Pemphigus vulgaris; History of tobacco abuse; Annual physical exam; Vitamin D deficiency; History of B12 deficiency; Obesity; Macromastia; Thoracic back pain; Stress incontinence in female; Encounter for prescription of female contraceptives; Rotator cuff syndrome of left shoulder; and Candidiasis, intertrigo on their problem list.   has a past medical history of Abnormal Pap smear (2004), Allergy, Anal fissure, Bacterial infection, Dysmenorrhea, Endometriosis, GERD (gastroesophageal reflux disease), H/O  varicella, Leukorrhea (04/08/2007), Menorrhagia, Pemphigus vulgaris, Preterm labor, Trichomonas, Vaginitis, and Yeast infection.  FH:  Family History  Problem Relation Age of Onset  . COPD Maternal Grandmother        Emphysema  . Cancer Maternal Grandmother   . Cancer Maternal Aunt   . Cancer Paternal Grandmother   . Hypertension Mother   . Kidney disease Mother   . Hypertension Father   . Alcohol abuse Father   . Alcohol abuse Sister   . Alcohol abuse Brother   . Diabetes Maternal Uncle     SH Social History   Tobacco Use  . Smoking status: Former Smoker    Last attempt to quit: 03/06/2003    Years since quitting: 14.0  . Smokeless tobacco: Never Used  Substance Use Topics  . Alcohol use: No  . Drug use: No    Review of Systems Review of systems negative except for pertinent positives and negatives in history of present illness above.     Objective:     Vitals:   03/26/17 1543  BP: 110/72  Pulse: 94  Temp: 98.1 F (36.7 C)  TempSrc: Oral  Weight: 182 lb (82.6 kg)  Height: 5\' 1"  (1.549 m)   Body mass index is 34.39 kg/m.  Physical Exam  GEN: appears well, no apparent distress. Head: normocephalic and atraumatic  Eyes: conjunctiva without injection, sclera anicteric Ears: external ear and ear canal normal Nares: No rhinorrhea, significant erythema of inferior turbinates bilaterally with fresh bruise over her left inferior turbinate.  Transillumination test normal.  No tenderness over maxillary, ethmoid and frontal sinuses. Oropharynx: mmm without erythema or exudation.  HEM: negative for cervical  or periauricular lymphadenopathies CVS: Radial pulses 2+ bilaterally, cap refill is brisk RESP: no IWOB MSK: Left Shoulder:  Inspection reveals no abnormalities, atrophy or asymmetry.  Tenderness to palpation over her AC joint and proximally  Limited passive and active flexion and abduction in left shoulder to 90 degree.   Active flexion and abduction improved  after steroid injection  Passive flexion and abduction full after steroid injection.   More pain with external rotation and abduction. No significant pain with internal rotation.  Speeds and Yergason's tests normal and painless  Negative drop arm sign.  Neuro: C4-T1 intact but motor strength with shoulder abduction is limited due to pain.  SKIN: some skin induration under both breast. No erythema or increased warmth. (Examined with mother in the room) PSYCH: euthymic mood with congruent affect     Assessment and Plan:  1. Rotator cuff syndrome of left shoulder: history and exam suggestive for rotator cuff tendinitis.  She could also have AC joint inflammation.  Frozen shoulder excluded by full passive range of motion after steroid injection.  Doubt osteoarthritis but cannot rule out.  Steroid injection given as below.  Home exercise regimen including wall walking with fingers and icing discussed.  Will obtain shoulder x-ray to assess for possible OA.  - meloxicam (MOBIC) 15 MG tablet; Take 1 tablet (15 mg total) by mouth daily.  Dispense: 30 tablet; Refill: 0 - methylPREDNISolone acetate (DEPO-MEDROL) injection 40 mg - DG left shoulder (AP, Axillary Lateral and Y-view)  2. Allergic rhinitis, unspecified seasonality, unspecified trigger: exam and presentation consistent with allergic rhinitis.  Gave a prescription for Flonase nasal spray.  Discussed about proper way of using.  Also recommended trying nasal saline spray.   3. Candidiasis, intertrigo: Exam consistent with intertrigo. She has large breast contributing to this. She will return to discuss about breast reduction surgery. - nystatin cream (MYCOSTATIN); Apply 1 application topically 2 (two) times daily.  Dispense: 30 g; Refill: 0   But this is longer than the previous one shoulder Injection Procedure Note  Pre-operative Diagnosis: left shoulder rotator cuff syndrome  Post-operative Diagnosis: same  Indications: Diagnostic  and therapeutic  Procedure Details   Written consent was obtained for the procedure. The shoulder was prepped with iodine and the skin was anesthetized. Using a 22 gauge needle the left shoulder targetting AC joint was is injected with 1 mL 1% lidocaine and 1 mL of triamcinolone (KENALOG) 40mg /ml under the posterior aspect of the acromion. The injection site was cleansed with topical isopropyl alcohol and a dressing was applied.  Complications:  None; patient tolerated the procedure well.  Return if symptoms worsen or fail to improve.  [name], MD 03/26/17 Pager: [phone]

## 2017-03-26 NOTE — ED Provider Notes (Signed)
[facility] EMERGENCY DEPARTMENT Provider Note   CSN: [id number] Arrival date & time: 03/26/17  3532     History   Chief Complaint No chief complaint on file.   HPI Christina Allen is a 42 y.o. female.  HPI   42 y/o F presenting with left shoulder pain ongoing for about 1.5 months.  States it has been worse in the last 2 weeks.  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  She denies any falls or recent trauma.  Has been able to move the arm, but it increases her pain.  Has tried taking tramadol, Tylenol, and meloxicam with no relief.  Denies any numbness/weakness/swelling to the left upper extremity.  No chest pain, shortness of breath, abdominal pain, fevers, or any other symptoms.  She denies any history of blood clots.  Was seen at [facility] on 1/6 with similar sxs and was dx with rotator cuff tendinitis. Was sent home with sling.  Was then seen by PCP today for further evaluation of the left shoulder pain. PCP felt that history and exam are suggested of rotator cuff tendinitis as well as possible AC joint inflammation.  She was given a steroid injection into the shoulder and was advised to follow-up with a home exercise regimen.  Pt states that since she had the steroid shot her pain has been worse.  She contacted the office and states that she was told to come here.  Resident note indicates that patient was told that she may have increased pain and limited mobility within 24 hours of receiving the steroid injection but that this pain usually resolves within 1-3 days and typically can be managed with over-the-counter analgesics/supportive care.  Resident further documented that patient was very upset on the phone and stated that she would come to the ER if her symptoms do not resolve within an hour.  Past Medical History:  Diagnosis Date  . Abnormal Pap smear 2004  . Allergy   . Anal fissure   . Bacterial infection   . Dysmenorrhea   . Endometriosis   . GERD  (gastroesophageal reflux disease)   . H/O varicella   . Leukorrhea 04/08/2007   Physiologic  . Menorrhagia   . Pemphigus vulgaris   . Preterm labor   . Trichomonas   . Vaginitis   . Yeast infection     Patient Active Problem List   Diagnosis Date Noted  . Rotator cuff syndrome of left shoulder 03/26/2017  . Candidiasis, intertrigo 03/26/2017  . Encounter for prescription of female contraceptives 03/21/2017  . Stress incontinence in female 11/17/2015  . Thoracic back pain 07/19/2015  . Macromastia 06/03/2015  . Obesity 04/30/2014  . Allergic rhinitis 01/25/2012  . GERD (gastroesophageal reflux disease) 01/25/2012  . Irritable bowel syndrome 01/25/2012  . Pemphigus vulgaris 01/25/2012  . History of tobacco abuse 01/25/2012  . Annual physical exam 01/25/2012  . History of B12 deficiency 07/25/2011  . Vitamin D deficiency 05/29/2010    Past Surgical History:  Procedure Laterality Date  . CESAREAN SECTION    . DILATION AND CURETTAGE OF UTERUS    . OTHER SURGICAL HISTORY     tubal ligation and c-section  . TUBAL LIGATION    . WISDOM TOOTH EXTRACTION      OB History    Gravida Para Term Preterm AB Living   3 3       3    SAB TAB Ectopic Multiple Live Births  3       Home Medications    Prior to Admission medications   Medication Sig Start Date End Date Taking? Authorizing Provider  acetaminophen (TYLENOL) 500 MG tablet Take 2 tablets (1,000 mg total) by mouth every 8 (eight) hours as needed for mild pain, moderate pain or fever. Take 2 tabs every 8 hours. 03/10/17 03/10/18  Tereasa Coop, PA-C  albuterol (PROVENTIL HFA;VENTOLIN HFA) 108 (90 Base) MCG/ACT inhaler Inhale 2 puffs into the lungs every 6 (six) hours as needed for wheezing or shortness of breath. 11/17/15   [name], MD  fluticasone (FLONASE) 50 MCG/ACT nasal spray Place 2 sprays into both nostrils daily. 03/26/17   [name], MD  meloxicam (MOBIC) 15 MG tablet Take 1 tablet (15 mg total) by  mouth daily. 03/26/17   [name], MD  nystatin cream (MYCOSTATIN) Apply 1 application topically 2 (two) times daily. 03/26/17   [name], MD  traMADol (ULTRAM) 50 MG tablet Take 1 tablet (50 mg total) by mouth every 6 (six) hours as needed. 03/10/17   Tereasa Coop, PA-C    Family History Family History  Problem Relation Age of Onset  . COPD Maternal Grandmother        Emphysema  . Cancer Maternal Grandmother   . Cancer Maternal Aunt   . Cancer Paternal Grandmother   . Hypertension Mother   . Kidney disease Mother   . Hypertension Father   . Alcohol abuse Father   . Alcohol abuse Sister   . Alcohol abuse Brother   . Diabetes Maternal Uncle     Social History Social History   Tobacco Use  . Smoking status: Former Smoker    Last attempt to quit: 03/06/2003    Years since quitting: 14.0  . Smokeless tobacco: Never Used  Substance Use Topics  . Alcohol use: No  . Drug use: No     Allergies   Shellfish-derived products   Review of Systems Review of Systems  Constitutional: Negative for chills and fever.  HENT: Negative for ear pain and sore throat.   Eyes: Negative for pain and visual disturbance.  Respiratory: Negative for cough and shortness of breath.   Cardiovascular: Negative for chest pain and palpitations.  Gastrointestinal: Negative for abdominal pain and vomiting.  Genitourinary: Negative for dysuria and hematuria.  Musculoskeletal: Negative for arthralgias and back pain.       Left shoulder pain  Skin: Negative for color change and rash.  Neurological: Negative for seizures and syncope.  All other systems reviewed and are negative.    Physical Exam Updated Vital Signs BP 108/78 (BP Location: Left Arm)   Pulse 88   Temp 97.7 F (36.5 C) (Oral)   Resp 18   Ht 5\' 1"  (1.549 m)   Wt 82.6 kg (182 lb)   SpO2 97%   BMI 34.39 kg/m   Physical Exam  Constitutional: She appears well-developed and well-nourished. No distress.  HENT:  Head:  Normocephalic and atraumatic.  Eyes: Conjunctivae are normal.  Neck: Neck supple.  Cardiovascular: Normal rate, regular rhythm and normal heart sounds.  No murmur heard. Pulmonary/Chest: Effort normal and breath sounds normal. No stridor. No respiratory distress. She has no wheezes. She has no rales.  Abdominal: Soft. Bowel sounds are normal. She exhibits no distension. There is no tenderness. There is no guarding.  Musculoskeletal: She exhibits no edema.  TTP to the anterior portion of the left shoulder. Pt able to lift shoulder  up above her head, however is painful. Negative empty can test. Pain with cross-over test of LUE. Normal strength with abduction and adduction of bilat shoulders. Normal strength to flexion and extension at elbows bilat. Normal sensation to BUE. Normal cap refill. Normal grip strength. No evidence of cellulitis or purulent drainage from the injection site. No erythema or warm suggestive of septic joint.  Neurological: She is alert.  Skin: Skin is warm and dry.  Psychiatric: She has a normal mood and affect.  Nursing note and vitals reviewed.    ED Treatments / Results  Labs (all labs ordered are listed, but only abnormal results are displayed) Labs Reviewed - No data to display  EKG  EKG Interpretation None       Radiology Dg Shoulder Left  Result Date: 03/26/2017 CLINICAL DATA:  Pain for 1 month EXAM: LEFT SHOULDER - 2+ VIEW COMPARISON:  None. FINDINGS: Frontal, Y scapular, and axillary images were obtained. There is no fracture or dislocation. Joint spaces appear normal. No erosive change. Visualized left lung is clear. IMPRESSION: No fracture or dislocation.  No evident arthropathy. Electronically Signed   By: Lowella Grip III M.D.   On: 03/26/2017 19:52    Procedures Procedures (including critical care time)  Medications Ordered in ED Medications  fentaNYL (SUBLIMAZE) injection 50 mcg (50 mcg Intramuscular Given 03/26/17 2058)     Initial  Impression / Assessment and Plan / ED Course  I have reviewed the triage vital signs and the nursing notes.  Pertinent labs & imaging results that were available during my care of the patient were reviewed by me and considered in my medical decision making (see chart for details).   Rechecked pt. Her pain has improved since receiving pain meds. She is able to lift her shoulder above her head. Reiterated that patient will likely have pain for the next 1-3 days that she should continue the pain medication that she has Artie been prescribed with at home.  She should also use warm and cold compresses for pain.  She should follow-up with her primary care within 1 week and return to the ER for any new or worsening symptoms.  Final Clinical Impressions(s) / ED Diagnoses   Final diagnoses:  Left shoulder pain, unspecified chronicity   42 year old female with over 1 month history of left shoulder pain.  Has been seen by urgent care and primary care for this and just received steroid injection her pain today.  Was told by primary care that she would likely have pain for the next 24 hours and that it may take up to 3 days for pain to completely subside after receiving this injection.  Patient is here complaining of continued pain.  No weakness/numbness/decreased range of motion noted on exam.  Neurovascularly intact.  No evidence of septic joint.  Was treated with pain medicine here and symptoms improved.  Advised to continue current pain medication regimen and follow-up with her primary care doctor for further evaluation and potential referral to orthopedics.  ED Discharge Orders    None       Bishop Dublin 03/27/17 [id number]    Orlie Dakin, MD 03/28/17 [phone]

## 2017-03-26 NOTE — Patient Instructions (Addendum)
It was great seeing you today! We have addressed the following issues today  Allergy: Please use Flonase nasal spray as we discussed in the office.  You can also use saline nose spray as needed for dryness.  Shoulder pain: This is likely due to inflammation in shoulder.  We gave you steroid injection today.  We have also ordered an x-ray of the shoulder.  You can have this done at [facility] any time.  I also recommend doing the exercises we discussed in the office for about 10-15 minutes 4-5 times a day followed by icing for 5-10 minutes.  Skin rash: This is likely due to yeast infection.  I sent a prescription for nystatin cream to the pharmacy.  You can come back and see [place] to discuss about breast reduction.    If we did any lab work today, and the results require attention, either me or my nurse will get in touch with you. If everything is normal, you will get a letter in mail and a message via . If you don't hear from [place] in two weeks, please give [place] a call. Otherwise, we look forward to seeing you again at your next visit. If you have any questions or concerns before then, please call the clinic at [phone].  Please bring all your medications to every doctors visit  Sign up for My Chart to have easy access to your labs results, and communication with your Primary care physician.    Please check-out at the front desk before leaving the clinic.    Take Care,   Dr. Cyndia Skeeters   Shoulder Exercises Ask your health care provider which exercises are safe for you. Do exercises exactly as told by your health care provider and adjust them as directed. It is normal to feel mild stretching, pulling, tightness, or discomfort as you do these exercises, but you should stop right away if you feel sudden pain or your pain gets worse.Do not begin these exercises until told by your health care provider.  Exercise E: Warehouse manager (External Rotation and Abduction)  1. Stand in a doorway with one of  your feet slightly in front of the other. This is called a staggered stance. If you cannot reach your forearms to the door frame, stand facing a corner of a room. 2. Choose one of the following positions as told by your health care provider: ? Place your hands and forearms on the door frame above your head. ? Place your hands and forearms on the door frame at the height of your head. ? Place your hands on the door frame at the height of your elbows. 3. Slowly move your weight onto your front foot until you feel a stretch across your chest and in the front of your shoulders. Keep your head and chest upright and keep your abdominal muscles tight. 4. Hold for __________ seconds. 5. To release the stretch, shift your weight to your back foot. Repeat __________ times. Complete this stretch __________ times a day.

## 2017-03-26 NOTE — Discharge Instructions (Addendum)
Continue to take the medications that you have been prescribed to treat your pain. Please follow up with your primary doctor within the next 5-7 days for re-evaluation of your symptoms and for potential referral to orthopedics. Please return to the ER sooner if you have any new or worsening symptoms including any weakness or numbness to the left upper extremity.

## 2017-03-26 NOTE — Telephone Encounter (Signed)
Emergency Line After Hours Call:   Patient calling due to increased left shoulder pain. She was seen at [facility] today by PCP who gave her an steroid injection in that shoulder. Patient is crying and difficult to understand on the phone. She does report that she took Tramadol about 2 hours ago and has heating pad on the shoulder and it is still very painful. She denies redness of the injection site or drainage from the area. Afebrile at home. I suspect patient may be having an acute steroid flare. Discussed with patient that some people have increased pain and limited mobility within 24 hours of receiving steroid injection. Stated that it usually resolves within 1-3 days and typically can be managed with OTC analgesics/supportive care. Patient continued to cry about her pain. Advised her that she could try ice and/or tylenol + NSAIDs with her tramadol. She again stated how much pain she was in. Offered SDA for tomorrow and patient continued to cry. Recommended if pain were that significant that she been seen at Urgent Care or ED tonight. She said she would wait an hour and then go to ED if no improvement. Will route to PCP for FYI.   Phill Myron, D.O. 03/26/2017, 6:50 PM PGY-3, [place]

## 2017-03-26 NOTE — ED Triage Notes (Signed)
Pt presents with shoulder x 1 month with pain worsening.  Pt was seen by PCP today, given steroid injection with pain so bad, pt unable to move L extremity.  Pt denies any initial injury.

## 2017-03-28 ENCOUNTER — Ambulatory Visit: Payer: Medicaid Other | Admitting: Student

## 2017-04-02 ENCOUNTER — Ambulatory Visit: Payer: Medicaid Other | Admitting: Student

## 2017-04-02 ENCOUNTER — Encounter: Payer: Self-pay | Admitting: Student

## 2017-04-02 ENCOUNTER — Other Ambulatory Visit: Payer: Self-pay

## 2017-04-02 VITALS — BP 114/78 | HR 102 | Temp 98.5°F | Ht 61.0 in | Wt 181.4 lb

## 2017-04-02 DIAGNOSIS — R000 Tachycardia, unspecified: Secondary | ICD-10-CM

## 2017-04-02 DIAGNOSIS — N62 Hypertrophy of breast: Secondary | ICD-10-CM

## 2017-04-02 DIAGNOSIS — K219 Gastro-esophageal reflux disease without esophagitis: Secondary | ICD-10-CM

## 2017-04-02 DIAGNOSIS — J309 Allergic rhinitis, unspecified: Secondary | ICD-10-CM | POA: Diagnosis not present

## 2017-04-02 MED ORDER — LORATADINE 10 MG PO TABS: 10.0000 mg | ORAL_TABLET | Freq: Every day | ORAL | 11 refills | Status: DC

## 2017-04-02 MED ORDER — RANITIDINE HCL 150 MG PO TABS: 150.0000 mg | ORAL_TABLET | Freq: Two times a day (BID) | ORAL | 3 refills | Status: DC

## 2017-04-02 NOTE — Patient Instructions (Signed)
It was great seeing you today! We have addressed the following issues today  Concern about your breasts, we send a referral to plastic surgery.  Someone will get in touch with you in the next couple of weeks.  Acid reflux: We send a prescription for Zantac to your pharmacy.  You can take this twice a day.  Allergy: Continue using your Flonase.  You can also try antihistamine such as Claritin or Allegra or Zyrtec.   If we did any lab work today, and the results require attention, either me or my nurse will get in touch with you. If everything is normal, you will get a letter in mail and a message via . If you don't hear from [place] in two weeks, please give [place] a call. Otherwise, we look forward to seeing you again at your next visit. If you have any questions or concerns before then, please call the clinic at [phone].  Please bring all your medications to every doctors visit  Sign up for My Chart to have easy access to your labs results, and communication with your Primary care physician.    Please check-out at the front desk before leaving the clinic.    Take Care,   Dr. Cyndia Skeeters

## 2017-04-02 NOTE — Progress Notes (Signed)
Subjective:    Christina Allen is a 42 y.o. old female here   HPI Breast reduction: Patient with history of macromastia.  She states she was evaluated on referral to plastic surgery about a year ago.  At that time, the plastic surgeon recommended trying physical therapy.  She reports doing physical therapy for 2 months without improvement in her symptoms.  She reports her skin getting raw under both breasts frequently.  She also reports upper and lower back pain and her bra strap digging in to her shoulder and causing pain.  He had 3 grown children.  She is not interested in having more children.  PMH/Problem List: has Allergic rhinitis; GERD (gastroesophageal reflux disease); Irritable bowel syndrome; Pemphigus vulgaris; History of tobacco abuse; Annual physical exam; Vitamin D deficiency; History of B12 deficiency; Obesity; Macromastia; Thoracic back pain; Stress incontinence in female; Encounter for prescription of female contraceptives; Rotator cuff syndrome of left shoulder; and Candidiasis, intertrigo on their problem list.   has a past medical history of Abnormal Pap smear (2004), Allergy, Anal fissure, Bacterial infection, Dysmenorrhea, Endometriosis, GERD (gastroesophageal reflux disease), H/O varicella, Leukorrhea (04/08/2007), Menorrhagia, Pemphigus vulgaris, Preterm labor, Trichomonas, Vaginitis, and Yeast infection.  FH:  Family History  Problem Relation Age of Onset  . COPD Maternal Grandmother        Emphysema  . Cancer Maternal Grandmother   . Cancer Maternal Aunt   . Cancer Paternal Grandmother   . Hypertension Mother   . Kidney disease Mother   . Hypertension Father   . Alcohol abuse Father   . Alcohol abuse Sister   . Alcohol abuse Brother   . Diabetes Maternal Uncle     SH Social History   Tobacco Use  . Smoking status: Former Smoker    Last attempt to quit: 03/06/2003    Years since quitting: 14.0  . Smokeless tobacco: Never Used  Substance Use Topics  . Alcohol use: No    . Drug use: No    Review of Systems Review of systems negative except for pertinent positives and negatives in history of present illness above.     Objective:     Vitals:   04/02/17 1534 04/02/17 1555  BP: 114/78   Pulse: (!) 113 (!) 102  Temp: 98.5 F (36.9 C)   TempSrc: Oral   SpO2: 98%   Weight: 181 lb 6.4 oz (82.3 kg)   Height: 5\' 1"  (1.549 m)    Body mass index is 34.28 kg/m.  Physical Exam  GEN: appears well, no apparent distress. CVS: Tachycardia, RR, nl s1 & s2, no murmurs, no edema RESP: no IWOB, good air movement bilaterally, CTAB GI: BS present & normal, soft, NTND MSK: no focal tenderness or notable swelling SKIN: no apparent skin lesion ENDO: negative thyromegally NEURO: alert and oiented appropriately, no gross deficits  PSYCH: euthymic mood with congruent affect  Breast exam: with enlarged pendulous breasts. Skin induration under both breasts. (examined with chaperone)     Assessment and Plan:  1. Macromastia: Patient with recurrent skin infection and irritation under her breasts period.  She also reports both upper and lower back pain which could be due to frontal load of her breast on her body.  Also reports her bra strap cutting into her shoulders causing pain.  Per patient, she already failed physical therapy.  Will refer to plastic surgery for evaluation for possible surgical reduction.  - Ambulatory referral to Plastic Surgery  2. Tachycardia: No history of anemia.  Is not symptomatic.  -  TSH - CBC With Differential  3. Gastroesophageal reflux disease, esophagitis presence not specified - ranitidine (ZANTAC) 150 MG tablet; Take 1 tablet (150 mg total) by mouth 2 (two) times daily.  Dispense: 60 tablet; Refill: 3  4. Allergic rhinitis, unspecified seasonality, unspecified trigger: Will add antihistamine to her Flonase nasal spray. - loratadine (CLARITIN) 10 MG tablet; Take 1 tablet (10 mg total) by mouth daily.  Dispense: 30 tablet; Refill:  11   Return if symptoms worsen or fail to improve.  [name], MD 04/02/17 Pager: [phone]

## 2017-04-03 ENCOUNTER — Other Ambulatory Visit: Payer: Self-pay | Admitting: Student

## 2017-04-03 DIAGNOSIS — E559 Vitamin D deficiency, unspecified: Secondary | ICD-10-CM

## 2017-04-03 LAB — CBC WITH DIFFERENTIAL
Basophils Absolute: 0 10*3/uL (ref 0.0–0.2)
Basos: 0 %
EOS (ABSOLUTE): 0.1 10*3/uL (ref 0.0–0.4)
Eos: 1 %
Hematocrit: 39.2 % (ref 34.0–46.6)
Hemoglobin: 13.3 g/dL (ref 11.1–15.9)
Immature Grans (Abs): 0 10*3/uL (ref 0.0–0.1)
Immature Granulocytes: 0 %
Lymphocytes Absolute: 2.4 10*3/uL (ref 0.7–3.1)
Lymphs: 30 %
MCH: 31 pg (ref 26.6–33.0)
MCHC: 33.9 g/dL (ref 31.5–35.7)
MCV: 91 fL (ref 79–97)
Monocytes Absolute: 0.4 10*3/uL (ref 0.1–0.9)
Monocytes: 6 %
Neutrophils Absolute: 4.9 10*3/uL (ref 1.4–7.0)
Neutrophils: 63 %
RBC: 4.29 x10E6/uL (ref 3.77–5.28)
RDW: 13.5 % (ref 12.3–15.4)
WBC: 7.8 10*3/uL (ref 3.4–10.8)

## 2017-04-03 LAB — TSH: TSH: 4.27 u[IU]/mL (ref 0.450–4.500)

## 2017-04-03 LAB — VITAMIN D 25 HYDROXY (VIT D DEFICIENCY, FRACTURES): Vit D, 25-Hydroxy: 13.7 ng/mL — ABNORMAL LOW (ref 30.0–100.0)

## 2017-04-03 MED ORDER — VITAMIN D (ERGOCALCIFEROL) 1.25 MG (50000 UNIT) PO CAPS: 50000.0000 [IU] | ORAL_CAPSULE | ORAL | 0 refills | Status: AC

## 2017-04-15 ENCOUNTER — Ambulatory Visit: Payer: Medicaid Other | Admitting: Student

## 2017-04-15 ENCOUNTER — Other Ambulatory Visit: Payer: Self-pay

## 2017-04-15 ENCOUNTER — Encounter: Payer: Self-pay | Admitting: Student

## 2017-04-15 VITALS — BP 108/72 | HR 83 | Temp 98.0°F | Ht 61.0 in | Wt 176.2 lb

## 2017-04-15 DIAGNOSIS — F39 Unspecified mood [affective] disorder: Secondary | ICD-10-CM | POA: Diagnosis not present

## 2017-04-15 MED ORDER — QUETIAPINE FUMARATE 100 MG PO TABS: 100.0000 mg | ORAL_TABLET | Freq: Every day | ORAL | 0 refills | Status: DC

## 2017-04-15 NOTE — Patient Instructions (Signed)
It was great seeing you today! We have addressed the following issues today  Mood issue: We have started you on medication.  Please take this medication at bedtime.  Please call 911 or crisis hotline if you feel like you are in danger.  We have also sent a referral to psychiatrist for further evaluation and management.  If we did any lab work today, and the results require attention, either me or my nurse will get in touch with you. If everything is normal, you will get a letter in mail and a message via . If you don't hear from [place] in two weeks, please give [place] a call. Otherwise, we look forward to seeing you again at your next visit. If you have any questions or concerns before then, please call the clinic at [phone].  Please bring all your medications to every doctors visit  Sign up for My Chart to have easy access to your labs results, and communication with your Primary care physician.    Please check-out at the front desk before leaving the clinic.    Take Care,   Dr. Cyndia Skeeters

## 2017-04-15 NOTE — Progress Notes (Signed)
Subjective:    Christina Allen is a 42 y.o. old female here for "anxiety and depression".  She is here with her mother.  HPI Presenting Issue: "Anxiety and depression"  Report of symptoms: "I don't want to do anything" Breathing very fast occasionally, nervousness, butterfly, sleeping a lot, low appetite, fatigue, irritability and feeling depressed.  She also reports not feeling tired after sleepless night occasionally.  Occasional risky behavior including excessive spending.  She denies audiovisual hallucination or delusion.  Mother worried about mood swing. She goes from "normal and zero for no reason".   Duration of CURRENT symptoms: 2 years. Mother says she has never gotten over her brother's death in 37 Age of onset of first mood disturbance: 39 years  Impact on function:  -Losing interest in people and work -Her irritation affecting her relation with her children -"I feel like I don't want to go to work". Missed some days from work. She says her employers have been patient with her because she has been with them for long time. However, she says she could be written off if she continues to miss work.   Psychiatric History - Diagnoses: none - Hospitalizations:  none - Pharmacotherapy:  Never. Went to [employer] about a year ago when she has SI.  - Outpatient therapy: went to counseling last year for for some months. [place] [address]. It helped a little bit  Family history of psychiatric issues: Mother: depression and bipolar Sister: bipolar Maternal uncles (2): with some psych issues  Current and history of substance use: -None. Used to smoke marijuana. Quit last year   Other: (Consider trauma, interpersonal violence) Mother says she was alcoholic and Trinda had to deal with that and take care of her younger siblings  PHQ-9: 25 GAD7: 27  PMH/Problem List: has Allergic rhinitis; GERD (gastroesophageal reflux disease); Irritable bowel syndrome;  Pemphigus vulgaris; History of tobacco abuse; Annual physical exam; Vitamin D deficiency; History of B12 deficiency; Obesity; Macromastia; Thoracic back pain; Stress incontinence in female; Encounter for prescription of female contraceptives; Rotator cuff syndrome of left shoulder; Candidiasis, intertrigo; and Mood disorder (HCC) on their problem list.   has a past medical history of Abnormal Pap smear (2004), Allergy, Anal fissure, Bacterial infection, Dysmenorrhea, Endometriosis, GERD (gastroesophageal reflux disease), H/O varicella, Leukorrhea (04/08/2007), Menorrhagia, Pemphigus vulgaris, Preterm labor, Trichomonas, Vaginitis, and Yeast infection.  FH:  Family History  Problem Relation Age of Onset  . COPD Maternal Grandmother        Emphysema  . Cancer Maternal Grandmother   . Cancer Maternal Aunt   . Cancer Paternal Grandmother   . Hypertension Mother   . Kidney disease Mother   . Hypertension Father   . Alcohol abuse Father   . Alcohol abuse Sister   . Alcohol abuse Brother   . Diabetes Maternal Uncle     SH Social History   Tobacco Use  . Smoking status: Former Smoker    Last attempt to quit: 03/06/2003    Years since quitting: 14.1  . Smokeless tobacco: Never Used  Substance Use Topics  . Alcohol use: No  . Drug use: No    Review of Systems Review of systems negative except for pertinent positives and negatives in history of present illness above.     Objective:     Vitals:   04/15/17 1341  BP: 108/72  Pulse: 83  Temp: 98 F (36.7 C)  TempSrc: Oral  SpO2: 99%  Weight: 176 lb 3.2 oz (79.9  kg)  Height: 5\' 1"  (1.549 m)   Body mass index is 33.29 kg/m.  Physical Exam  GEN: appears well, no apparent distress. CVS: RRR, nl s1 & s2, no edema RESP: no IWOB ENDO: negative thyromegally NEURO: alert and oiented appropriately, no gross deficits  PSYCH:appropriately dressed. Maintains good eye contact and is cooperative and attentive. Speech is normal volume and  rate. Mood is depressed with a mildly restricted affect. Thought process is logical and goal directed. No suicidal or homicidal ideation. Does not appear to be responding to any internal stimuli. Able to maintain train of thought and concentrate on the questions.  Depression screen [facility] 2/9 04/15/2017 04/02/2017 03/21/2017  Decreased Interest 2 0 0  Down, Depressed, Hopeless 2 0 0  PHQ - 2 Score 4 0 0  Altered sleeping 2 - -  Tired, decreased energy 2 - -  Change in appetite 2 - -  Feeling bad or failure about yourself  2 - -  Trouble concentrating 2 - -  Moving slowly or fidgety/restless 2 - -  Suicidal thoughts 2 - -  PHQ-9 Score 18 - -  Difficult doing work/chores Somewhat difficult - -   GAD 7 : Generalized Anxiety Score 04/15/2017  Nervous, Anxious, on Edge 2  Control/stop worrying 2  Worry too much - different things 2  Trouble relaxing 2  Restless 2  Easily annoyed or irritable 2  Afraid - awful might happen 2  Total GAD 7 Score 14  Anxiety Difficulty Somewhat difficult   Assessment and Plan:  1. Mood disorder [facility]): Patient's presentation concerning for bipolar disorder.  PHQ 9 high at 18 suggesting severe depression. it is unclear to me why her PHQ is suddenly high as her PHQ-2 has been 0 for the last two years. She has passive suicidal thoughts without intent or active plan. Denies history of suicidal attempt in the past. It is unclear to me,  patient's PHQ 2 has been consistently 0 over the last 2 years although she reports having symptoms for over 2 years.  She denies recent change in her life.  His GAD 7 is also elevated to 14.  She also have some symptoms of mania and strong family history of bipolar disorder. I offered her referral to [facility] but she says she would think about this and call back. I gave her the yellow information sheet for [facility]. She is interested in trying medication. It is unsafe to start her on SSRI. Will try Seroquel an see response.  - QUEtiapine (SEROQUEL) 100  MG tablet; Take 1 tablet (100 mg total) by mouth at bedtime.  Dispense: 30 tablet; Refill: 0 - Ambulatory referral to Psychiatry  Return in about 2 weeks (around 04/29/2017) for Mood issue.  [name], MD 04/15/17 Pager: [phone]

## 2017-04-30 ENCOUNTER — Telehealth: Payer: Self-pay | Admitting: Student

## 2017-04-30 NOTE — Telephone Encounter (Signed)
Pt feels the seraquel is too strong. Would like to talk to dr Cyndia Skeeters about this

## 2017-05-01 NOTE — Telephone Encounter (Signed)
Called and talked to patient about her concern about her Seroquel. She says the medicine is too strong and likes to try different medication. I saw her over two weeks ago and recommended follow up in two weeks at that time. I recommended trying half a tablet. If that is still too strong, I recommended stopping altogether. I also recommended scheduling an appointment to discuss about other options in detail. She voiced understanding and agrees. She appreciated the call back.

## 2017-05-06 ENCOUNTER — Ambulatory Visit: Payer: Medicaid Other | Admitting: Student

## 2017-05-13 NOTE — Telephone Encounter (Signed)
Received PA on Seroquel, contacted pt, she has decided to stop medication.  Appt made to followup with [facility] on 05/20/17. Pharmacy informed that pt has dc'd med. Jerryl Holzhauer, Salome Spotted, CMA

## 2017-05-20 ENCOUNTER — Other Ambulatory Visit: Payer: Self-pay | Admitting: Student

## 2017-05-20 ENCOUNTER — Ambulatory Visit: Payer: Medicaid Other | Admitting: Student

## 2017-05-20 ENCOUNTER — Encounter: Payer: Self-pay | Admitting: Student

## 2017-05-20 ENCOUNTER — Encounter: Payer: Self-pay | Admitting: Psychology

## 2017-05-20 ENCOUNTER — Other Ambulatory Visit: Payer: Self-pay

## 2017-05-20 VITALS — BP 106/68 | HR 101 | Temp 98.6°F | Ht 61.0 in | Wt 180.2 lb

## 2017-05-20 DIAGNOSIS — F39 Unspecified mood [affective] disorder: Secondary | ICD-10-CM

## 2017-05-20 DIAGNOSIS — G4700 Insomnia, unspecified: Secondary | ICD-10-CM | POA: Diagnosis not present

## 2017-05-20 MED ORDER — SERTRALINE HCL 50 MG PO TABS: 50.0000 mg | ORAL_TABLET | Freq: Every day | ORAL | 0 refills | Status: DC

## 2017-05-20 NOTE — Progress Notes (Signed)
Dr. Cyndia Skeeters requested a [place].   Presenting Issue:  Symptoms of depression and insomnia.  Report of symptoms:  Christina Allen has been feeling depressed for some time.  Duration of CURRENT symptoms:  A year  Age of onset of first mood disturbance:  She has had symptoms the last few months.  Impact on function:  Christina Allen is having "middle insomina" and has low energy. She is also experiencing some anhedonia.   Psychiatric History - Diagnoses: none - Hospitalizations: none - Pharmacotherapy: Seroquel, now Zoloft - Outpatient therapy: yes -- it was helpful  Family history of psychiatric issues:  Dr. Cyndia Skeeters advised the Bipolar Disorder is in her family (mother)  so he tried her on Seroquel, but she did not respond well; he is now trying her on Zoloft after learning that she has not had Bipolar Symtoms (MDQ was negative).  Current and history of substance use:  No.  Medical conditions that might explain or contribute to symptoms:  uncertain  PHQ-9:  10 (moderate, no SI, very difficult) GAD-7:  8 (mild to moderate, very difficult) MDQ (if indicated):  Negative, endorsed irritability and distractibility.   PHQ-9 today is 10 Anhedonia:   2 Mood:  1 Sleep:  2 Energy: 2 Appetite: 1 Worthless: 1 Concentrate: 1 Psychomotor: 0 SI:  0  GAD-7 1) 1 2) 2 3) 2 4) 2 5) 0 6) 1 7) 0  Assessment / Plan / Recommendations: This patient has very flat affect and spoke slowly and quietly. Christina Allen is depressed and experiencing middle insomnia (night-time waking at 2 am, unable to return to sleep). She would benefit from behavioral activation and sleep hygiene techniques. Dr. Cyndia Skeeters provided her with a handout on sleep hygiene, which we discussed. The patient enjoys watching funny tv shows, which could help with her behavioral activation. She would also like to begin exercising again. Christina Allen feels that this will benefit her physical (acid reflux, BMI) and mental health. She set a goal to  exercise once per week over the next two weeks. She feels that this is important (7/10) and she is confident that she can make this happen (7/10). Her church offers free exercise classes and she is willing to join a gym as well. She will return to see me on Monday, April 1 at 3:00 p.m. We spent 35 minutes together during her warm hand-off.  Warmhandoff complete.

## 2017-05-20 NOTE — Progress Notes (Signed)
Subjective:    Christina Allen is a 42 y.o. old female here for mood issue.  HPI Mood issue: Symptoms include sadness, poor sleep, fatigue and sense of guilt.  She reports feeling sad almost every day.  She reports crying a lot.  She reports oversleeping during the daytime and difficulty sleeping at night.  She also reports having panic attack about twice a week.  Reports having these symptoms for over a year.  She also reports stress at work.  She is a [employer] manager.  She says her supervisor makes her do everything at work. She was seen in clinic about a month ago for similar issue.  At that time there was concern for manic symptoms and suicidal thoughts.  She also reported bipolar disorder in her mom and sister although she says she is not sure about these diagnosis today.  She was started on Seroquel but did not tolerate.  Today, she likes to try a different antidepressant.  She denies suicidal or homicidal ideation.  She still admits intermittent irritability and some racing thoughts but denies risky behaviors, grandiosity or talkativeness.  Denies history of physical, mental or sexual trauma/abuse.  However, she states that her brother was killed by her sister in 2005.  She was not at the scene.  Denies flashbacks.  She denies smoking cigarettes, drinking alcohol or recreational drug use. PMH/Problem List: has Allergic rhinitis; GERD (gastroesophageal reflux disease); Irritable bowel syndrome; Pemphigus vulgaris; History of tobacco abuse; Annual physical exam; Vitamin D deficiency; History of B12 deficiency; Obesity; Macromastia; Thoracic back pain; Stress incontinence in female; Encounter for prescription of female contraceptives; Rotator cuff syndrome of left shoulder; Candidiasis, intertrigo; and Mood disorder (HCC) on their problem list.   has a past medical history of Abnormal Pap smear (2004), Allergy, Anal fissure, Bacterial infection, Dysmenorrhea, Endometriosis, GERD (gastroesophageal reflux disease), H/O  varicella, Leukorrhea (04/08/2007), Menorrhagia, Pemphigus vulgaris, Preterm labor, Trichomonas, Vaginitis, and Yeast infection.  FH:  Family History  Problem Relation Age of Onset  . COPD Maternal Grandmother        Emphysema  . Cancer Maternal Grandmother   . Cancer Maternal Aunt   . Cancer Paternal Grandmother   . Hypertension Mother   . Kidney disease Mother   . Hypertension Father   . Alcohol abuse Father   . Alcohol abuse Sister   . Alcohol abuse Brother   . Diabetes Maternal Uncle     SH Social History   Tobacco Use  . Smoking status: Former Smoker    Last attempt to quit: 03/06/2003    Years since quitting: 14.2  . Smokeless tobacco: Never Used  Substance Use Topics  . Alcohol use: No  . Drug use: No    Review of Systems Review of systems negative except for pertinent positives and negatives in history of present illness above.     Objective:     Vitals:   05/20/17 1046  BP: 106/68  Pulse: (!) 101  Temp: 98.6 F (37 C)  TempSrc: Oral  SpO2: 97%  Weight: 180 lb 3.2 oz (81.7 kg)  Height: 5\' 1"  (1.549 m)   Body mass index is 34.05 kg/m.  Physical Exam  GEN:  tearful Eyes: conjunctiva without injection, sclera anicterice. CVS: RRR, nl s1 & s2, no murmurs, no edema RESP: no IWOB, good air movement bilaterally, CTAB GI: BS present & normal, soft, NTND SKIN: no apparent skin lesion ENDO: negative thyromegally NEURO: alert and oiented appropriately, no gross deficits  PSYCH: appropriately dressed. Maintains good eye  contact and is cooperative and attentive. Speech is normal volume and rate. Mood is depressed with a mildly restricted affect.  Tearful at times.  Thought process is logical and goal directed. No suicidal or homicidal ideation. Does not appear to be responding to any internal stimuli. Able to maintain train of thought and concentrate on the questions.  Depression screen [facility] 2/9 05/20/2017 04/15/2017 04/02/2017 03/21/2017 11/17/2015  Decreased  Interest 2 2 0 0 0  Down, Depressed, Hopeless 1 2 0 0 0  PHQ - 2 Score 3 4 0 0 0  Altered sleeping 2 2 - - -  Tired, decreased energy 2 2 - - -  Change in appetite 1 2 - - -  Feeling bad or failure about yourself  1 2 - - -  Trouble concentrating 1 2 - - -  Moving slowly or fidgety/restless 0 2 - - -  Suicidal thoughts 0 2 - - -  PHQ-9 Score 10 18 - - -  Difficult doing work/chores Very difficult Somewhat difficult - - -   GAD 7 : Generalized Anxiety Score 05/20/2017 04/15/2017  Nervous, Anxious, on Edge 1 2  Control/stop worrying 2 2  Worry too much - different things 2 2  Trouble relaxing 2 2  Restless 0 2  Easily annoyed or irritable 1 2  Afraid - awful might happen 0 2  Total GAD 7 Score 8 14  Anxiety Difficulty Very difficult Somewhat difficult    Assessment and Plan:  1. Mood disorder [facility]): she scores 10 on PHQ 9  and 8 on GAD-7 which are improved from prior. She did not tolerate Seroquel which was started out of concern for manic symptoms she and her mother reported when she was seen in the office about a month ago.  Today, she denies manic symptoms except for intermittent irritability  and some racing thoughts. She says her mother exaggerated everything when she was here with her about a month ago. She denies SI or HI. She likes to try a different antidepressant.  She also likes to talk to [facility].  Gave prescription for Zoloft.  Discussed about rare adverse effects including but not limited to SI or HI.  Warm handoff to [facility].  Follow-up in 2 weeks - sertraline (ZOLOFT) 50 MG tablet; Take 1 tablet (50 mg total) by mouth daily.  Dispense: 30 tablet; Refill: 0  2. Insomnia, unspecified type: Likely due to poor sleep hygiene.  She reports sleeping/napping during the daytime and difficulty sleeping at night.  Could also be due to underlying mood issue.  Discussed about the sleep hygiene gave a handout.  Treating the underlying mood issue can help as well.  Return in about 2 weeks (around  06/03/2017) for Mood issue.  [name], MD 05/20/17 Pager: [phone]

## 2017-05-20 NOTE — Patient Instructions (Signed)
It was great seeing you today! We have addressed the following issues today  Mood issue: We started you on Zoloft today.  Please pick up this prescription and start taking.  Please let us know if you have any concerning symptoms after start taking this medication.  Please call 911 or crisis line if you feel like your life is in danger.   Sleep issue: Recommend trying sleep hygiene.  Read the handout we gave you.  Zoloft might help you with this as well  If we did any lab work today, and the results require attention, either me or my nurse will get in touch with you. If everything is normal, you will get a letter in mail and a message via . If you don't hear from [place] in two weeks, please give [place] a call. Otherwise, we look forward to seeing you again at your next visit. If you have any questions or concerns before then, please call the clinic at [phone].  Please bring all your medications to every doctors visit  Sign up for My Chart to have easy access to your labs results, and communication with your Primary care physician.    Please check-out at the front desk before leaving the clinic.    Take Care,   Dr. Cyndia Skeeters

## 2017-05-20 NOTE — Assessment & Plan Note (Signed)
°  Assessment / Plan / Recommendations: This patient has very flat affect and spoke slowly and quietly. Christina Allen is depressed and experiencing middle insomnia (night-time waking at 2 am, unable to return to sleep). She would benefit from behavioral activation and sleep hygiene techniques. Dr. Cyndia Skeeters provided her with a handout on sleep hygiene, which we discussed. The patient enjoys watching funny tv shows. She would also like to begin exercising again. Christina Allen feels that this will benefit her physical (acid reflux, BMI) and mental health. She set a goal to exercise once per week over the next two weeks. She feels that this is important (7/10) and she is confident that she can make this happen (7/10). Her church offers free exercise classes and she is willing to join a gym as well. She will return to see me on Monday, April 1 at 3:00 p.m. We spent 35 minutes together during her warm hand-off.

## 2017-06-17 ENCOUNTER — Ambulatory Visit (INDEPENDENT_AMBULATORY_CARE_PROVIDER_SITE_OTHER): Payer: Medicaid Other | Admitting: Internal Medicine

## 2017-06-17 ENCOUNTER — Ambulatory Visit: Payer: Medicaid Other

## 2017-06-17 ENCOUNTER — Other Ambulatory Visit: Payer: Self-pay

## 2017-06-17 VITALS — BP 115/75 | HR 88 | Temp 98.3°F | Wt 179.0 lb

## 2017-06-17 DIAGNOSIS — R112 Nausea with vomiting, unspecified: Secondary | ICD-10-CM

## 2017-06-17 MED ORDER — ONDANSETRON 4 MG PO TBDP: 4.0000 mg | ORAL_TABLET | Freq: Three times a day (TID) | ORAL | 0 refills | Status: DC | PRN

## 2017-06-17 NOTE — Patient Instructions (Addendum)
I am concerned that your symptoms may be related to a GI virus. I have prescribed Zofran to help with your nausea. It is important that you keep drinking plenty of fluids. It is okay if your appetite is poor for a few days.   I am also concerned that the Ibuprofen may be irritating your stomach. I would recommend stopping it and taking your Ranitidine twice per day.   We will check a lab to make sure your pancreas is okay.

## 2017-06-18 ENCOUNTER — Ambulatory Visit (INDEPENDENT_AMBULATORY_CARE_PROVIDER_SITE_OTHER): Payer: Medicaid Other | Admitting: Student

## 2017-06-18 ENCOUNTER — Encounter: Payer: Self-pay | Admitting: Student

## 2017-06-18 ENCOUNTER — Telehealth: Payer: Self-pay | Admitting: Student

## 2017-06-18 ENCOUNTER — Telehealth: Payer: Self-pay | Admitting: *Deleted

## 2017-06-18 ENCOUNTER — Other Ambulatory Visit: Payer: Self-pay

## 2017-06-18 VITALS — BP 116/78 | HR 94 | Temp 97.8°F | Ht 61.0 in | Wt 177.2 lb

## 2017-06-18 DIAGNOSIS — N62 Hypertrophy of breast: Secondary | ICD-10-CM

## 2017-06-18 DIAGNOSIS — R1013 Epigastric pain: Secondary | ICD-10-CM | POA: Diagnosis not present

## 2017-06-18 DIAGNOSIS — K5900 Constipation, unspecified: Secondary | ICD-10-CM | POA: Diagnosis not present

## 2017-06-18 DIAGNOSIS — Z114 Encounter for screening for human immunodeficiency virus [HIV]: Secondary | ICD-10-CM

## 2017-06-18 DIAGNOSIS — R112 Nausea with vomiting, unspecified: Secondary | ICD-10-CM

## 2017-06-18 LAB — LIPASE: Lipase: 16 U/L (ref 14–72)

## 2017-06-18 LAB — POCT H PYLORI SCREEN: H Pylori Screen, POC: NEGATIVE

## 2017-06-18 MED ORDER — PANTOPRAZOLE SODIUM 40 MG PO TBEC: 40.0000 mg | DELAYED_RELEASE_TABLET | Freq: Every day | ORAL | 0 refills | Status: AC

## 2017-06-18 MED ORDER — POLYETHYLENE GLYCOL 3350 17 GM/SCOOP PO POWD: ORAL | 1 refills | Status: AC

## 2017-06-18 NOTE — Progress Notes (Signed)
Subjective:    Christina Allen is a 42 y.o. old female here for nausea.   HPI Nausea: for three days. She reports mild brief sharp and burning diffuse abdominal pain. She had one episode of emesis without blood or bile. Denies diarrhea. Reports constipation. BM every 3-4 days. Reports hard stool and straining. Denies blood in stool or dark stool. Denies heart burn, reflux or sour taste in her mouth. However, she feels like something is stuck in her chest. She also reports burping a lot.  She is on ranitidine 150 mg twice daily.  She has no constitutional symptoms.  No URI symptoms either.  Breast reduction: Patient was seen in clinic about 3 months ago for this issue.  At that time she was referred to surgery.  She says she has not heard from anyone since then.   PMH/Problem List: has Allergic rhinitis; GERD (gastroesophageal reflux disease); Irritable bowel syndrome; Pemphigus vulgaris; History of tobacco abuse; Annual physical exam; Vitamin D deficiency; History of B12 deficiency; Obesity; Macromastia; Thoracic back pain; Stress incontinence in female; Screening for HIV (human immunodeficiency virus); Rotator cuff syndrome of left shoulder; Candidiasis, intertrigo; Mood disorder ([place]); Insomnia; Non-intractable vomiting with nausea; Dyspepsia; and Constipation on their problem list.   has a past medical history of Abnormal Pap smear (2004), Allergy, Anal fissure, Bacterial infection, Dysmenorrhea, Endometriosis, GERD (gastroesophageal reflux disease), H/O varicella, Leukorrhea (04/08/2007), Menorrhagia, Pemphigus vulgaris, Preterm labor, Trichomonas, Vaginitis, and Yeast infection.  FH:  Family History  Problem Relation Age of Onset  . COPD Maternal Grandmother        Emphysema  . Cancer Maternal Grandmother   . Cancer Maternal Aunt   . Cancer Paternal Grandmother   . Hypertension Mother   . Kidney disease Mother   . Hypertension Father   . Alcohol abuse Father   . Alcohol abuse Sister   . Alcohol  abuse Brother   . Diabetes Maternal Uncle     SH Social History   Tobacco Use  . Smoking status: Former Smoker    Last attempt to quit: 03/06/2003    Years since quitting: 14.2  . Smokeless tobacco: Never Used  Substance Use Topics  . Alcohol use: No  . Drug use: No    Review of Systems Review of systems negative except for pertinent positives and negatives in history of present illness above.     Objective:     Vitals:   06/18/17 1110  BP: 116/78  Pulse: 94  Temp: 97.8 F (36.6 C)  TempSrc: Oral  SpO2: 99%  Weight: 177 lb 3.2 oz (80.4 kg)  Height: 5\' 1"  (1.549 m)   Body mass index is 33.48 kg/m.  Physical Exam  GEN: appears well & comfortable. No apparent distress. Oropharynx: MMM. No erythema. No exudation or petechiae.  Uvula midline HEM: negative for cervical or periauricular lymphadenopathies CVS: RRR, nl s1 & s2, no murmurs, no edema RESP: no IWOB, good air movement bilaterally, CTAB GI: BS present & normal, soft, mild tenderness over epigastric area.  No palpable mass or rebound tenderness. SKIN: no apparent skin lesion NEURO: alert and oiented appropriately, no gross deficits   PSYCH: euthymic mood with congruent affect    Assessment and Plan:  1. Non-intractable vomiting with nausea, unspecified vomiting type: in the absence of signs and symptoms suggestive for infectious etiology, I suspect this could be due to dyspepsia and/or constipation.  She has no constitutional symptoms.  She has one episode of emesis.  He is tolerating fluids well.  On exam, she is tender to palpation over epigastric area.  Will manage dyspepsia and constipation as below.  2. Dyspepsia: point-of-care H. pylori IgG negative which makes H. pylori infection unlikely.  Will switch her ranitidine to Protonix for 6 weeks  3. Constipation, unspecified constipation type: reports hard stool every 3-4 days.  Given history of depression/?anxiety, I wonder if this could be IBS.  Will try  MiraLAX - polyethylene glycol powder (GLYCOLAX/MIRALAX) powder; Mix 17 gm with glass full of water and take 2-3 times a day  Dispense: 525 g; Refill: 1  4. Screening for HIV (human immunodeficiency virus) - HIV antibody  5.  Macromastia: Referral order from 3 months ago still pending.  We will clarify this with our referral coordinator.  She has medical indication for breast reduction surgery including frequent intertrigo, upper and lower back pain and shoulder pain which could be due to her macromastia.  She said she failed physical therapy in the past.  Return if symptoms worsen or fail to improve.  [name], MD 06/18/17 Pager: [phone]

## 2017-06-18 NOTE — Telephone Encounter (Signed)
Pt informed of below. Zimmerman Rumple, Sidney Kann D, CMA  

## 2017-06-18 NOTE — Telephone Encounter (Signed)
Called to let her know that her H. pylori test was negative.  Advised her to pick her prescription for Protonix and start taking.  Advised to stop ranitidine.  Patient voiced understanding and agrees.

## 2017-06-18 NOTE — Telephone Encounter (Signed)
-----   Message from Nicolette Bang, DO sent at 06/18/2017 11:03 AM EDT ----- Please call patient to let her know that lipase, the pancreas lab, was normal.   Phill Myron, D.O. 06/18/2017, 11:03 AM PGY-3, [place]

## 2017-06-18 NOTE — Progress Notes (Signed)
   Subjective:    Christina Allen - 42 y.o. female MRN [id number]  Date of birth: 1975/09/30  HPI  Christina Allen is here for vomiting. Reports 1-2 days of vomiting. This was preceded by diarrhea. She has associated nausea and diffuse abdominal pain. Pain is worst in the epigastric region. No fevers or sick contacts. Has chronic shoulder pain for which she takes a lot of OTC ibuprofen. Also reports burning in the epigastric region and sensation of needed to burp. She takes Ranitidine 150 mg BID.   -  reports that she quit smoking about 14 years ago. She has never used smokeless tobacco. - Review of Systems: Per HPI. - Past Medical History: Patient Active Problem List   Diagnosis Date Noted  . Non-intractable vomiting with nausea 06/18/2017  . Dyspepsia 06/18/2017  . Constipation 06/18/2017  . Insomnia 05/20/2017  . Mood disorder ([place]) 04/15/2017  . Rotator cuff syndrome of left shoulder 03/26/2017  . Candidiasis, intertrigo 03/26/2017  . Screening for HIV (human immunodeficiency virus) 03/21/2017  . Stress incontinence in female 11/17/2015  . Thoracic back pain 07/19/2015  . Macromastia 06/03/2015  . Obesity 04/30/2014  . Allergic rhinitis 01/25/2012  . GERD (gastroesophageal reflux disease) 01/25/2012  . Irritable bowel syndrome 01/25/2012  . Pemphigus vulgaris 01/25/2012  . History of tobacco abuse 01/25/2012  . Annual physical exam 01/25/2012  . History of B12 deficiency 07/25/2011  . Vitamin D deficiency 05/29/2010   - Medications: reviewed and updated   Objective:   Physical Exam BP 115/75 (BP Location: Left Arm)   Pulse 88   Temp 98.3 F (36.8 C) (Oral)   Wt 179 lb (81.2 kg)   BMI 33.82 kg/m  Gen: NAD, alert, cooperative with exam, well-appearing HEENT: NCAT, PERRL, clear conjunctiva, oropharynx clear, supple neck CV: RRR, good S1/S2, no murmur, no edema, capillary refill brisk  Resp: CTABL, no wheezes, non-labored Abd: BS present, TTP in the epigastric region, no  palpable masses, no guarding or organomegaly Skin: no rashes, normal turgor  Neuro: no gross deficits.  Psych: good insight, alert and oriented     Assessment & Plan:   1. Nausea and vomiting, intractability of vomiting not specified, unspecified vomiting type Differential remains broad. Given report of diarrhea preceding other symptoms may be a viral gastroenteritis. GERD also seems likely given heartburn type symptoms. I have recommended increasing Ranitidine to twice per day. Have also recommended discontinuation of ibuprofen given propensity to cause gastritis and TTP over the epigastric region. Will check a lipase given tenderness on exam although patient does not seem to have significant risk factors for pancreatitis. Have prescribed Zofran to help with nausea.  - Lipase - ondansetron (ZOFRAN ODT) 4 MG disintegrating tablet; Take 1 tablet (4 mg total) by mouth every 8 (eight) hours as needed for nausea or vomiting.  Dispense: 20 tablet; Refill: 0   Phill Myron, D.O. 06/18/2017, 1:15 PM PGY-3, [place]

## 2017-06-18 NOTE — Patient Instructions (Signed)
It was great seeing you today! We have addressed the following issues today  Nausea/abdominal pain: could be due to constipation or indigestion.  Send a prescription for MiraLAX to your pharmacy for the patient.  We are doing a test to exclude a stomach infection that could cause indigestion.  Meanwhile, continue taking your ranitidine.  If you test is positive, we may change to a different medication.   If we did any lab work today, and the results require attention, either me or my nurse will get in touch with you. If everything is normal, you will get a letter in mail and a message via . If you don't hear from [place] in two weeks, please give [place] a call. Otherwise, we look forward to seeing you again at your next visit. If you have any questions or concerns before then, please call the clinic at [phone].  Please bring all your medications to every doctors visit  Sign up for My Chart to have easy access to your labs results, and communication with your Primary care physician.    Please check-out at the front desk before leaving the clinic.    Take Care,   Dr. Cyndia Skeeters   Indigestion Indigestion is a feeling of pain, discomfort, burning, or fullness in the upper part of your belly (abdomen). It can come and go. It may occur often or rarely. Indigestion tends to happen while you are eating or right after you have finished eating. It may be worse at night and while bending over or lying down. Follow these instructions at home: Take these actions to lessen your pain or discomfort and to help avoid problems. Diet  Follow a diet as told by your doctor. You may need to avoid foods and drinks such as: ? Coffee and tea (with or without caffeine). ? Drinks that contain alcohol. ? Energy drinks and sports drinks. ? Carbonated drinks or sodas. ? Chocolate and cocoa. ? Peppermint and mint flavorings. ? Garlic and onions. ? Horseradish. ? Spicy and acidic foods, such as peppers, chili powder,  curry powder, vinegar, hot sauces, and BBQ sauce. ? Citrus fruit juices and citrus fruits, such as oranges, lemons, and limes. ? Tomato-based foods, such as red sauce, chili, salsa, and pizza with red sauce. ? Fried and fatty foods, such as donuts, french fries, potato chips, and high-fat dressings. ? High-fat meats, such as hot dogs, rib eye steak, sausage, ham, and bacon. ? High-fat dairy items, such as whole milk, butter, and cream cheese.  Eat small meals often. Avoid eating large meals.  Avoid drinking large amounts of liquid with your meals.  Avoid eating meals during the 2-3 hours before bedtime.  Avoid lying down right after you eat.  Do not exercise right after you eat. General instructions  Pay attention to any changes in your symptoms.  Take over-the-counter and prescription medicines only as told by your doctor. Do not take aspirin, ibuprofen, or other NSAIDs unless your doctor says it is okay.  Do not use any tobacco products, including cigarettes, chewing tobacco, and e-cigarettes. If you need help quitting, ask your doctor.  Wear loose clothes. Do not wear anything tight around your waist.  Raise (elevate) the head of your bed about 6 inches (15 cm).  Try to lower your stress. If you need help doing this, ask your doctor.  If you are overweight, lose an amount of weight that is healthy for you. Ask your doctor about a safe weight loss goal.  Keep  all follow-up visits as told by your doctor. This is important. Contact a doctor if:  You have new symptoms.  You lose weight and you do not know why it is happening.  You have trouble swallowing, or it hurts to swallow.  Your symptoms do not get better with treatment.  Your symptoms last for more than two days.  You have a fever.  You throw up (vomit). Get help right away if:  You have pain in your arms, neck, jaw, teeth, or back.  You feel sweaty, dizzy, or light-headed.  You pass out (faint).  You  have chest pain or shortness of breath.  You cannot stop throwing up, or you throw up blood.  Your poop (stool) is bloody or black.  You have very bad pain in your belly. This information is not intended to replace advice given to you by your health care provider. Make sure you discuss any questions you have with your health care provider. Document Released: 03/24/2010 Document Revised: 07/28/2015 Document Reviewed: 06/16/2014 Elsevier Interactive Patient Education  [employer].

## 2017-06-19 LAB — HIV ANTIBODY (ROUTINE TESTING W REFLEX): HIV Screen 4th Generation wRfx: NONREACTIVE

## 2017-06-24 ENCOUNTER — Telehealth: Payer: Self-pay | Admitting: Student

## 2017-06-24 ENCOUNTER — Other Ambulatory Visit: Payer: Self-pay | Admitting: Student

## 2017-06-24 ENCOUNTER — Other Ambulatory Visit: Payer: Self-pay

## 2017-06-24 ENCOUNTER — Ambulatory Visit (INDEPENDENT_AMBULATORY_CARE_PROVIDER_SITE_OTHER): Payer: Medicaid Other | Admitting: Student

## 2017-06-24 VITALS — BP 110/85 | HR 102 | Temp 98.3°F | Ht 61.0 in | Wt 177.0 lb

## 2017-06-24 DIAGNOSIS — M7581 Other shoulder lesions, right shoulder: Secondary | ICD-10-CM | POA: Diagnosis not present

## 2017-06-24 DIAGNOSIS — M75102 Unspecified rotator cuff tear or rupture of left shoulder, not specified as traumatic: Secondary | ICD-10-CM | POA: Diagnosis not present

## 2017-06-24 DIAGNOSIS — M778 Other enthesopathies, not elsewhere classified: Secondary | ICD-10-CM

## 2017-06-24 MED ORDER — MELOXICAM 15 MG PO TABS: 15.0000 mg | ORAL_TABLET | Freq: Every day | ORAL | 0 refills | Status: DC

## 2017-06-24 NOTE — Progress Notes (Signed)
Subjective:    Christina Allen is a 42 y.o. old female here SDA for right shoulder pain  HPI Right shoulder pain: this has been going on for about 6 months. Spontaneous onset. No history of trauma or injury. She worked as Quarry manager. Now works as Psychologist, sport and exercise. Denies swelling, overlying skin erythema or fever. She describes the pain as "lingering toothache". Pain is "inside" her shoulder joint. She says it radiates up to her right neck. Worse with movement but especially when she tries to reach behind or high. She reports tingling and numbness all around her arm down to her distal third of her forearm globally. Denies fever, chills, unintentional weight loss or excessive sweat.  Patient has history of similar symptoms in the left shoulder that responded well to a steroid injection about 3 months ago.  She is not interested in a steroid injection today because of the pain she had with prior injection.   PMH/Problem List: has Allergic rhinitis; GERD (gastroesophageal reflux disease); Irritable bowel syndrome; Pemphigus vulgaris; History of tobacco abuse; Annual physical exam; Vitamin D deficiency; History of B12 deficiency; Obesity; Macromastia; Thoracic back pain; Stress incontinence in female; Screening for HIV (human immunodeficiency virus); Rotator cuff syndrome of left shoulder; Candidiasis, intertrigo; Mood disorder ([place]); Insomnia; Non-intractable vomiting with nausea; Dyspepsia; Constipation; and Right shoulder tendinitis on their problem list.   has a past medical history of Abnormal Pap smear (2004), Allergy, Anal fissure, Bacterial infection, Dysmenorrhea, Endometriosis, GERD (gastroesophageal reflux disease), H/O varicella, Leukorrhea (04/08/2007), Menorrhagia, Pemphigus vulgaris, Preterm labor, Trichomonas, Vaginitis, and Yeast infection.  FH:  Family History  Problem Relation Age of Onset  . COPD Maternal Grandmother        Emphysema  . Cancer Maternal Grandmother   . Cancer Maternal Aunt   .  Cancer Paternal Grandmother   . Hypertension Mother   . Kidney disease Mother   . Hypertension Father   . Alcohol abuse Father   . Alcohol abuse Sister   . Alcohol abuse Brother   . Diabetes Maternal Uncle     SH Social History   Tobacco Use  . Smoking status: Former Smoker    Last attempt to quit: 03/06/2003    Years since quitting: 14.3  . Smokeless tobacco: Never Used  Substance Use Topics  . Alcohol use: No  . Drug use: No    Review of Systems Review of systems negative except for pertinent positives and negatives in history of present illness above.     Objective:     Vitals:   06/24/17 0440 06/24/17 1447 06/24/17 1640  BP: 110/85 (!) 118/100 110/85  Pulse: (!) 102 (!) 115 (!) 102  Temp:  98.3 F (36.8 C)   TempSrc:  Oral   SpO2:  99%   Weight:  177 lb (80.3 kg)   Height:  5\' 1"  (1.549 m)    Body mass index is 33.44 kg/m.  Physical Exam  GEN: appears well & comfortable. No apparent distress. Head: normocephalic and atraumatic  HEM: negative for cervical or periauricular lymphadenopathies CVS: Tachycardic to 102, RR, nl s1 & s2, no murmurs, no edema,  2+ radial pulses bilaterally RESP: no IWOB, good air movement bilaterally, CTAB MSK: Shoulder:  Inspection reveals no abnormalities, atrophy or asymmetry.  Point tenderness to palpation over the posterior aspect of her shoulder.  No tenderness over ACM  ROM intact except for limited internal rotation bilaterally  Rotator cuff strength normal. Some pain with resisted external rotation.  Empty can and Hawkins negative  No labral pathology noted with negative Obrien's and good stability.  No painful arc and no drop arm sign.  Neuro: C5-T1 intact  CVS: 2+ radial pulses bilaterally  SKIN: no apparent skin lesion PSYCH: euthymic mood with congruent affect    Assessment and Plan:  1. Right shoulder tendinitis: history and exam suggestive for rotator cuff tendinitis.  She has point tenderness over the  posterior aspect of her right shoulder.  Full range of motion except for limited internal rotation bilaterally.  She also have some pain with resisted external rotation.  Neurovascular intact although she reports some nondermatomal numbness and tingling in her right arm down to the distal third of the forearm.  Discussed home exercise.  Recommended Tylenol 1000 mg 3 times daily continuously.  If no improvement with this, can take meloxicam. - Ambulatory referral to Sports Medicine  Return if symptoms worsen or fail to improve.  [name], MD 06/24/17 Pager: [phone]

## 2017-06-24 NOTE — Telephone Encounter (Signed)
Saw patient in clinic

## 2017-06-24 NOTE — Patient Instructions (Signed)
Rotator Cuff Tendinitis Rotator cuff tendinitis is inflammation of the tough, cord-like bands that connect muscle to bone (tendons) in the rotator cuff. The rotator cuff includes all of the muscles and tendons that connect the arm to the shoulder. The rotator cuff holds the head of the upper arm bone (humerus) in the cup (fossa) of the shoulder blade (scapula). This condition can lead to a long-lasting (chronic) tear. The tear may be partial or complete. What are the causes? This condition is usually caused by overusing the rotator cuff. What increases the risk? This condition is more likely to develop in athletes and workers who frequently use their shoulder or reach over their heads. This can include activities such as:  Tennis.  Baseball or softball.  Swimming.  Construction work.  Painting.  What are the signs or symptoms? Symptoms of this condition include:  Pain spreading (radiating) from the shoulder to the upper arm.  Swelling and tenderness in front of the shoulder.  Pain when reaching, pulling, or lifting the arm above the head.  Pain when lowering the arm from above the head.  Minor pain in the shoulder when resting.  Increased pain in the shoulder at night.  Difficulty placing the arm behind the back.  How is this diagnosed? This condition is diagnosed with a medical history and physical exam. Tests may also be done, including:  X-rays.  MRI.  Ultrasounds.  CT or MR arthrogram. During this test, a contrast material is injected and then images are taken.  How is this treated? Treatment for this condition depends on the severity of the condition. In less severe cases, treatment may include:  Rest. This may be done with a sling that holds the shoulder still (immobilization). Your health care provider may also recommend avoiding activities that involve lifting your arm over your head.  Icing the shoulder.  Anti-inflammatory medicines, such as aspirin or  ibuprofen.  In more severe cases, treatment may include:  Physical therapy.  Steroid injections.  Surgery.  Follow these instructions at home: If you have a sling:  Wear the sling as told by your health care provider. Remove it only as told by your health care provider.  Loosen the sling if your fingers tingle, become numb, or turn cold and blue.  Keep the sling clean.  If the sling is not waterproof, do not let it get wet. Remove it, if allowed, or cover it with a watertight covering when you take a bath or shower. Managing pain, stiffness, and swelling  If directed, put ice on the injured area. ? If you have a removable sling, remove it as told by your health care provider. ? Put ice in a plastic bag. ? Place a towel between your skin and the bag. ? Leave the ice on for 20 minutes, 2-3 times a day.  Move your fingers often to avoid stiffness and to lessen swelling.  Raise (elevate) the injured area above the level of your heart while you are lying down.  Find a comfortable sleeping position or sleep on a recliner, if available. Driving  Do not drive or use heavy machinery while taking prescription pain medicine.  Ask your health care provider when it is safe to drive if you have a sling on your arm. Activity  Rest your shoulder as told by your health care provider.  Return to your normal activities as told by your health care provider. Ask your health care provider what activities are safe for you.  Do any   exercises or stretches as told by your health care provider.  If you do repetitive overhead tasks, take small breaks in between and include stretching exercises as told by your health care provider. General instructions  Do not use any products that contain nicotine or tobacco, such as cigarettes and e-cigarettes. These can delay healing. If you need help quitting, ask your health care provider.  Take over-the-counter and prescription medicines only as told by  your health care provider.  Keep all follow-up visits as told by your health care provider. This is important. Contact a health care provider if:  Your pain gets worse.  You have new pain in your arm, hands, or fingers.  Your pain is not relieved with medicine or does not get better after 6 weeks of treatment.  You have cracking sensations when moving your shoulder in certain directions.  You hear a snapping sound after using your shoulder, followed by severe pain and weakness. Get help right away if:  Your arm, hand, or fingers are numb or tingling.  Your arm, hand, or fingers are swollen or painful or they turn white or blue. Summary  Rotator cuff tendinitis is inflammation of the tough, cord-like bands that connect muscle to bone (tendons) in the rotator cuff.  This condition is usually caused by overusing the rotator cuff, which includes all of the muscles and tendons that connect the arm to the shoulder.  This condition is more likely to develop in athletes and workers who frequently use their shoulder or reach over their heads.  Treatment generally includes rest, anti-inflammatory medicines, and icing. In some cases, physical therapy and steroid injections may be needed. In severe cases, surgery may be needed. This information is not intended to replace advice given to you by your health care provider. Make sure you discuss any questions you have with your health care provider. Document Released: 05/12/2003 Document Revised: 02/06/2016 Document Reviewed: 02/06/2016 Elsevier Interactive Patient Education  2017 [employer]  

## 2017-06-24 NOTE — Telephone Encounter (Signed)
Pt would like Dr Cyndia Skeeters to please call her concerning the pain in her arm. She says she needs a note written for work also. She would like to speak with someone ASAP.

## 2017-06-25 ENCOUNTER — Ambulatory Visit: Payer: Medicaid Other

## 2017-07-03 ENCOUNTER — Encounter: Payer: Self-pay | Admitting: Family Medicine

## 2017-07-03 ENCOUNTER — Ambulatory Visit: Payer: Medicaid Other | Admitting: Family Medicine

## 2017-07-03 VITALS — BP 104/84 | Ht 61.0 in | Wt 177.0 lb

## 2017-07-03 DIAGNOSIS — M67911 Unspecified disorder of synovium and tendon, right shoulder: Secondary | ICD-10-CM

## 2017-07-03 DIAGNOSIS — M67912 Unspecified disorder of synovium and tendon, left shoulder: Secondary | ICD-10-CM | POA: Diagnosis not present

## 2017-07-03 MED ORDER — METHYLPREDNISOLONE ACETATE 40 MG/ML IJ SUSP
40.0000 mg | Freq: Once | INTRAMUSCULAR | Status: AC
  Administered 2017-07-03: 40 mg via INTRA_ARTICULAR

## 2017-07-03 NOTE — Progress Notes (Signed)
Chief complaint: Bilateral shoulder pain, right worse than left x6 months  History of present illness: Christina Allen is a 42 year old hand dominant female presents to the sports medicine office today with chief complaint of bilateral shoulder pain.  She reports that her right side is worse than her left side.  She reports that symptoms have been present for approximately 6 months now.  She has no report of any specific inciting incident, trauma, or injury to explain the pain.  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  Reports things such as forward flexion, extension, and reaching forward are all aggravating factors.  In addition, she also reports of reaching behind to grab a seatbelt or trying to put on her bra strap are all aggravating factors.  She is not report of any numbness, tingling, or burning paresthesias rating from her neck down either arm to either hand or fingers.  She reports no specific time a day is worse.  She reports no previous shoulder or neck injury or trauma.  She reports that symptoms have been progressively getting worse over the last 6 months.  He did see her primary physician here about a little over a week ago.  She was kindly referred here by him today for specialty evaluation.  He has had x-ray both of her shoulders, left in January 2019 and her right in January 2017.  This does not show any major osteoarthritis, just those type II acromion.  Reports that she has difficulty laying on her right side at nighttime secondary to pain.  Pain does not wake her up from sleep at nighttime.  She has not tried any type of injection therapy, HEP, or formal physical therapy.  She does not report of any fevers, chills, or night sweats.  Review of systems:  As stated above  Her past medical history, surgical history, family history, and social history obtained and reviewed.  Past medical history is notable for GERD, IBS, vitamin D deficiency, vitamin B12 deficiency, obesity; surgical  history notable for C-section, D&C, tubal ligation, and wisdom tooth extraction; she does not report of any current tobacco use; family history notable for hypertension, CKD, diabetes, COPD, and alcohol abuse; allergies and medications are reviewed and are reflected in EMR.  Physical exam: Vital signs are reviewed and are documented in the chart Gen.: Alert, oriented, appears stated age, in no apparent distress HEENT: Moist oral mucosa Respiratory: Normal respirations, able to speak in full sentences Cardiac: Regular rate, distal pulses 2+ Integumentary: No rashes on visible skin:  Neurologic: Strength 5/5 with bilateral rotator cuff strength, she does have normal and intact biceps, triceps, and elbow strength in both sides, sensation 2+ in bilateral upper extremity, DTRs symmetric and intact in bilateral upper extremities Psych: Normal affect, mood is described as good Musculoskeletal: Inspection of right shoulder reveals no obvious deformity or muscle atrophy, no warmth, erythema, ecchymosis, or effusion, she is tender to palpation over the distal supraspinatus and infraspinatus rotator cuff insertion over the humeral head, no tenderness to palpation over the distal clavicle, AC joint, acromion, biceps, triceps, or deltoid, no tenderness over the anterior shoulder near the glenohumeral joint, no tenderness over the cervical spine or paracervical spinal processes, no tenderness over the trapezius or rhomboids, motion on the right side is from 0degrees to 140 degrees, pain limits her from going further, with lateral abduction range of motion is to 120 degrees before pain limits her from going further, with internal range of motion she is able  to go to about the T10 level, with her right arm abducted to her side she has full internal range of motion, external range of motion to 60 degrees; inspection of her left shoulder reveals no obvious deformity or muscle atrophy, no warmth, erythema, ecchymosis, or  effusion, similarly she is also tender palpation over the distal supraspinatus and infraspinatus rotator cuff insertion over the humeral head, no tenderness elsewhere in the left shoulder, range of motion is from 0 degrees to 150 degrees of forward flexion, pain limits her from going further, with lateral abduction range of motion is also to 120 degrees before pain limits her from going further, internal range of motion she is able to go to the T8 level, with her left arm abducted to her side she does have full internal range of motion, external range of motion to 60 degrees; rotator cuff impingement testing is diffusely positive on both sides, with the right side being more prominent than the left side, empty can, Hawkins, Neer's, crossarm, and O'Brien are all positive, Speed, Yergason, and Spurling are negative on both sides  Procedure:  After written informed consent signed and obtained, and benefit of pain relief and risk of bleeding, infection, and steroid flare discussed, Louan agreed to proceed with cortisone injection into the right subacromial bursa. After timeout, area  was cleaned with Betadine and alcohol wipes. Ethyl chloride was used as topical anesthetic spray. Using 5 cc 1% lidocaine without epinephrine and 1 cc of 40 mg Depo-Medrol on a 25-gauge 1.5 inch needle, her right subacromial bursa was injected. She did not have any bleeding afterwards. No complications noted from procedure.  Assessment and plan: 1. Bilateral rotator cuff impingement syndrome, with the right side being more clinically symptomatic than the left side  Plan: I discussed with Verniece today that her clinical symptoms seem to fit in line with bilateral rotator cuff impingement syndrome with also suspected rotator cuff tendinopathy involving the supraspinatus and infraspinatus tendons.  She had recently just started on meloxicam, with no interval improvement in her symptoms.  Did offer her cortisone injection into her right  shoulder today since it is the more symptomatic side.  She is agreeable to this.  Injection done as noted above without any complications noted.  Discussed to continue the meloxicam, as it could help out with her left shoulder pain.  Did provide her with home exercise program discussing [place] exercises for rotator cuff strengthening.  Discussed if she is not improved in 4 weeks to return for follow-up.  Otherwise, she will follow-up on as-needed basis.   Mort Sawyers, M.D. [facility]

## 2017-07-17 ENCOUNTER — Other Ambulatory Visit: Payer: Self-pay

## 2017-07-17 ENCOUNTER — Ambulatory Visit: Payer: Medicaid Other | Admitting: Family Medicine

## 2017-07-17 ENCOUNTER — Encounter: Payer: Self-pay | Admitting: Family Medicine

## 2017-07-17 VITALS — BP 100/62 | HR 86 | Temp 98.3°F | Ht 61.0 in | Wt 179.0 lb

## 2017-07-17 DIAGNOSIS — H11152 Pinguecula, left eye: Secondary | ICD-10-CM

## 2017-07-17 NOTE — Assessment & Plan Note (Signed)
Her blister most closely resembles a fatty deposit such as a pinguecula, but due to her history of pemphigus vulgaris, she should be evaluated by ophthalmology.  I am placing an urgent referral today.

## 2017-07-17 NOTE — Progress Notes (Signed)
   Subjective:    Christina Allen - 42 y.o. female MRN [id number]  Date of birth: August 06, 1975  HPI  Christina Allen is here for a blister on the sclera of her L eye.  The blister developed three days ago and has left her with a gritty sensation in her L eye.  She denies any trauma or foreign bodies in the eye.  She also reports blurry vision since developing the blister.  She does have a PMH of pemphigus vulgaris that has occurred intermittently since she was 42 year old, with the last occurrence last year.  She also develops blisters in her mouth after eating citrus fruits and other acidic foods.       Health Maintenance:  There are no preventive care reminders to display for this patient.  -  reports that she quit smoking about 14 years ago. She has never used smokeless tobacco. - Review of Systems: Per HPI. - Past Medical History: Patient Active Problem List   Diagnosis Date Noted  . Pinguecula of left eye 07/17/2017  . Right shoulder tendinitis 06/24/2017  . Non-intractable vomiting with nausea 06/18/2017  . Dyspepsia 06/18/2017  . Constipation 06/18/2017  . Insomnia 05/20/2017  . Mood disorder ([place]) 04/15/2017  . Rotator cuff syndrome of left shoulder 03/26/2017  . Candidiasis, intertrigo 03/26/2017  . Screening for HIV (human immunodeficiency virus) 03/21/2017  . Stress incontinence in female 11/17/2015  . Thoracic back pain 07/19/2015  . Macromastia 06/03/2015  . Obesity 04/30/2014  . Allergic rhinitis 01/25/2012  . GERD (gastroesophageal reflux disease) 01/25/2012  . Irritable bowel syndrome 01/25/2012  . Pemphigus vulgaris 01/25/2012  . History of tobacco abuse 01/25/2012  . Annual physical exam 01/25/2012  . History of B12 deficiency 07/25/2011  . Vitamin D deficiency 05/29/2010   - Medications: reviewed and updated   Objective:   Physical Exam BP 100/62   Pulse 86   Temp 98.3 F (36.8 C) (Oral)   Ht 5\' 1"  (1.549 m)   Wt 179 lb (81.2 kg)   SpO2 99%   BMI 33.82  kg/m  Gen: NAD, alert, cooperative with exam, well-appearing HEENT: NCAT, clear conjunctiva, serous blister on the outer area of her L eye sclera; no conjunctival injection or swelling of the eyelid.  EOMI.  Normal visual acuity. CV: RRR, good S1/S2, no murmur, no edema, capillary refill brisk  Resp: CTABL, no wheezes, non-labored Neuro: no gross deficits.  Psych: good insight, alert and oriented        Assessment & Plan:   Pinguecula of left eye Her blister most closely resembles a fatty deposit such as a pinguecula, but due to her history of pemphigus vulgaris, she should be evaluated by ophthalmology.  I am placing an urgent referral today.    Maia Breslow, M.D. 07/17/2017, 12:14 PM PGY-1, [place]

## 2017-07-17 NOTE — Progress Notes (Signed)
Eye issue

## 2017-07-17 NOTE — Patient Instructions (Signed)
It was nice meeting you today Christina Allen!  I am referring you to ophthalmology to further investigate the cause of your eye blister.  This could be connected to your pemphigus rash, but it could be a fatty deposit called a pinguecula.  Someone will contact you about an appointment.  If you do not hear from anyone, please contact our office.  If you have any questions or concerns, please feel free to call the clinic.   Be well,  Dr. Shan Levans

## 2017-07-24 ENCOUNTER — Other Ambulatory Visit: Payer: Self-pay | Admitting: Student

## 2017-07-24 DIAGNOSIS — Z1231 Encounter for screening mammogram for malignant neoplasm of breast: Secondary | ICD-10-CM

## 2017-07-31 ENCOUNTER — Ambulatory Visit: Payer: Medicaid Other | Admitting: Family Medicine

## 2017-07-31 ENCOUNTER — Telehealth: Payer: Self-pay | Admitting: Family Medicine

## 2017-07-31 NOTE — Telephone Encounter (Signed)
Tylene did not appear to the office today for scheduled follow-up. She is welcome to call and reschedule appointment for follow up.    Mort Sawyers, MD Primary Care Sports Medicine Fellow [facility]

## 2017-08-20 ENCOUNTER — Ambulatory Visit: Payer: Medicaid Other

## 2017-08-28 ENCOUNTER — Telehealth: Payer: Self-pay

## 2017-08-28 DIAGNOSIS — T753XXA Motion sickness, initial encounter: Secondary | ICD-10-CM

## 2017-08-28 DIAGNOSIS — Z207 Contact with and (suspected) exposure to pediculosis, acariasis and other infestations: Secondary | ICD-10-CM

## 2017-08-28 DIAGNOSIS — Z2089 Contact with and (suspected) exposure to other communicable diseases: Secondary | ICD-10-CM

## 2017-08-28 NOTE — Telephone Encounter (Signed)
Patient called and stated at her place of employment there is a large scabies outbreak and they want everyone to be treated but it is a process to go through the doctor there;  Wants to know if scabies treatment can be prescribed for her; Also requests nausea patch be prescribed for an upcoming cruise.  Call back is [phone]  Danley Danker, RN [facility])

## 2017-08-29 MED ORDER — IVERMECTIN 3 MG PO TABS: 200.0000 ug/kg | ORAL_TABLET | Freq: Once | ORAL | 0 refills | Status: AC

## 2017-08-29 MED ORDER — SCOPOLAMINE 1 MG/3DAYS TD PT72: 1.0000 | MEDICATED_PATCH | TRANSDERMAL | 0 refills | Status: DC

## 2017-08-29 NOTE — Telephone Encounter (Signed)
Pt informed. Danne Scardina T Zailah Zagami, CMA  

## 2017-08-29 NOTE — Telephone Encounter (Signed)
Sent prescription for ivermectin (for scabies exposure) and scopolamine patch (for motion sickness) to her pharmacy. Please call and let her know.

## 2017-08-29 NOTE — Telephone Encounter (Signed)
Pt would like to speak to Dr Cyndia Skeeters medical assistance

## 2017-08-29 NOTE — Telephone Encounter (Signed)
Called pt back and she is wanting to see about getting the scabies meds and patch for her cruise.  She is requesting a pill form for the scabies. Katharina Caper, Daekwon Beswick D, [place]

## 2017-09-02 ENCOUNTER — Telehealth: Payer: Self-pay

## 2017-09-02 NOTE — Telephone Encounter (Signed)
Received fax from [place] requesting prior authorization of Transderm Scope Patch. Clinical questions for BCBS submitted via CoverMyMeds.Status pending. Should receive fax within 3 days.  Danley Danker, RN [facility] RN)

## 2017-09-04 NOTE — Telephone Encounter (Signed)
Patches denied per BCBS on CoverMyMeds. Spoke with pharmacy and cash price for 2 patches is $45.99. Patient aware. She will go pick up and pay cash.  Danley Danker, RN [facility] RN)

## 2017-09-10 ENCOUNTER — Ambulatory Visit
Admission: RE | Admit: 2017-09-10 | Discharge: 2017-09-10 | Disposition: A | Discharge status: Home / Self Care | Payer: BLUE CROSS/BLUE SHIELD | Source: Ambulatory Visit | Attending: Family Medicine | Admitting: Family Medicine

## 2017-09-10 ENCOUNTER — Encounter: Payer: Self-pay | Admitting: Family Medicine

## 2017-09-10 DIAGNOSIS — Z1231 Encounter for screening mammogram for malignant neoplasm of breast: Secondary | ICD-10-CM

## 2017-10-14 ENCOUNTER — Ambulatory Visit: Payer: BLUE CROSS/BLUE SHIELD

## 2017-10-15 ENCOUNTER — Ambulatory Visit: Payer: BLUE CROSS/BLUE SHIELD | Admitting: Family Medicine

## 2017-10-15 ENCOUNTER — Telehealth: Payer: Self-pay

## 2017-10-15 VITALS — BP 120/80 | HR 93 | Temp 98.2°F | Wt 177.0 lb

## 2017-10-15 DIAGNOSIS — M542 Cervicalgia: Secondary | ICD-10-CM

## 2017-10-15 DIAGNOSIS — S46811A Strain of other muscles, fascia and tendons at shoulder and upper arm level, right arm, initial encounter: Secondary | ICD-10-CM

## 2017-10-15 DIAGNOSIS — M25519 Pain in unspecified shoulder: Secondary | ICD-10-CM | POA: Insufficient documentation

## 2017-10-15 MED ORDER — METHOCARBAMOL 750 MG PO TABS
750.0000 mg | ORAL_TABLET | Freq: Four times a day (QID) | ORAL | 0 refills | Status: DC
Start: 2017-10-15 — End: 2017-11-01

## 2017-10-15 MED ORDER — DICLOFENAC SODIUM 1 % TD GEL: 4.0000 g | Freq: Four times a day (QID) | TRANSDERMAL | 1 refills | Status: DC

## 2017-10-15 MED ORDER — CAPSAICIN-MENTHOL-METHYL SAL 0.025-1-12 % EX CREA: 1.0000 "application " | TOPICAL_CREAM | Freq: Four times a day (QID) | CUTANEOUS | 1 refills | Status: DC

## 2017-10-15 NOTE — Telephone Encounter (Signed)
Received fax from [place], [place] needed on Diclofenac gel.  Clinical questions submitted via Cover My Meds.  Waiting on response, could take up to 72 hours.  Cover My Meds info: Key:  [id number]  Danley Danker, RN [facility] RN)

## 2017-10-15 NOTE — Progress Notes (Signed)
   Subjective   Patient ID: Christina Allen    DOB: 10/26/1975, 42 y.o. female   MRN: [id number]  CC: "Right shoulder pain"  HPI: [name] is a 42 y.o. female who presents to clinic today for the following:  EXTREMITY PAIN  Location: right trapezius to shoulder Pain started: sudden onset, 3 days ago, working as Quarry manager, she was lifting a patient Pain is: sharp intermittent pain with constant dull sensation Severity: 9/10 Medications tried: Flexeril, Tylenol, Ibuprofen without improvement Recent trauma: yes, see above Similar pain previously: no  Symptoms Redness: no Swelling: no Fever: no Weakness: some generalized weakness to right limited by pain Weight loss: no Rash: no  Of note, patient has left rotator cuff and has been receiving steroid injection to both shoulders bilaterally without improvement. Mother goes to [facility] and patient is requesting referral.  ROS: see HPI for pertinent.  [place]: Mood disorder, IBS, tobacco use disorder, GERD, B12 deficiency, obesity, pemphigus vulgaris, left rotator cuff syndrome, vitamin D deficiency.  Surgical history wisdom tooth extraction, tubal, D&C, C-section.  Family history HTN, CKD, alcohol use disorder.  Smoking status reviewed. Medications reviewed.  Objective   BP 120/80 (BP Location: Left Arm, Patient Position: Sitting, Cuff Size: Normal)   Pulse 93   Temp 98.2 F (36.8 C) (Oral)   Wt 177 lb (80.3 kg)   SpO2 98%   BMI 33.44 kg/m  Vitals and nursing note reviewed.  General: well nourished, well developed, NAD with non-toxic appearance HEENT: normocephalic, atraumatic, moist mucous membranes Neck: supple, non-tender without lymphadenopathy Cardiovascular: regular rate and rhythm without murmurs, rubs, or gallops Lungs: clear to auscultation bilaterally with normal work of breathing Skin: warm, dry, no rashes or lesions, cap refill < 2 seconds Extremities: warm and well perfused, normal tone, no edema, full passive range  of motion intact bilaterally on upper extremities, 5/5 motor function on upper extremities bilaterally including grip strength, incision intact, moderate tenderness to right trapezius upon palpation, neurovascular intact, external and internal rotation of upper extremities intact bilaterally  Assessment & Plan   Trapezius muscle strain, right, initial encounter Acute.  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  Does have underlying rotator cuff syndrome bilaterally currently seen by sports medicine though there is no obvious signs of rotator cuff involvement. - Given prescription for Voltaren gel 1% and capsaicin cream with instructions to use 4 times daily as needed, patient will decide based on cost which medication to initiate - Instructed to discontinue Flexeril and given prescription for Robaxin 750 mg every 4 hours as needed - Reviewed return precautions and instructed to follow-up with sports medicine  No orders of the defined types were placed in this encounter.  Meds ordered this encounter  Medications  . diclofenac sodium (VOLTAREN) 1 % GEL    Sig: Apply 4 g topically 4 (four) times daily.    Dispense:  1 Tube    Refill:  1  . methocarbamol (ROBAXIN-750) 750 MG tablet    Sig: Take 1 tablet (750 mg total) by mouth 4 (four) times daily.    Dispense:  30 tablet    Refill:  0  . Capsaicin-Menthol-Methyl Sal (CAPSAICIN-METHYL SAL-MENTHOL) 0.025-1-12 % CREA    Sig: Apply 1 application topically 4 (four) times daily.    Dispense:  1 Tube    Refill:  [place], [place], PGY-3 10/15/2017, 1:27 PM

## 2017-10-15 NOTE — Assessment & Plan Note (Addendum)
Acute.  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  Does have underlying rotator cuff syndrome bilaterally currently seen by sports medicine though there is no obvious signs of rotator cuff involvement. - Given prescription for Voltaren gel 1% and capsaicin cream with instructions to use 4 times daily as needed, patient will decide based on cost which medication to initiate - Instructed to discontinue Flexeril and given prescription for Robaxin 750 mg every 4 hours as needed - Reviewed return precautions and instructed to follow-up with sports medicine

## 2017-10-15 NOTE — Patient Instructions (Signed)
Thank you for coming in to see [place] today. Please see below to review our plan for today's visit.  Your symptoms seem to be consistent with a strain of the right trapezius muscle.  It does not appear that your rotator cuff is affected based on my exam.  This may take a week or 2 to resolve.  In the meantime, I have prescribed you capsaicin cream and Voltaren gel which you can apply 4 times daily on the affected area.  You can decide which medication to use based on cost.  Be sure to wash her hands after applying either medication.  I have also prescribed you a different muscle relaxer which may work better.  Please do not operate heavy machinery or drive as this can cause sedation.  It is very important that you avoid heavy lifting with your right arm especially with your job.  I have given you a letter to excuse you for 2 weeks.  Follow-up with sports medicine to discuss referral to [facility].  Please call the clinic at [phone] if your symptoms worsen or you have any concerns. It was our pleasure to serve you.  Harriet Butte, [place], PGY-3

## 2017-10-17 NOTE — Telephone Encounter (Signed)
Voltaren gel denied by patient's insurance per CoverMyMeds. Note to prescriber.   Danley Danker, RN [facility] RN)

## 2017-10-18 NOTE — Telephone Encounter (Signed)
Patient can use capsaicin cream instead. The denial for Voltaren Gel was expected.  Harriet But[place], [place], PGY-3

## 2017-10-31 ENCOUNTER — Ambulatory Visit: Payer: BLUE CROSS/BLUE SHIELD

## 2017-11-01 ENCOUNTER — Other Ambulatory Visit: Payer: Self-pay

## 2017-11-01 ENCOUNTER — Encounter: Payer: Self-pay | Admitting: Family Medicine

## 2017-11-01 ENCOUNTER — Ambulatory Visit (INDEPENDENT_AMBULATORY_CARE_PROVIDER_SITE_OTHER): Payer: BLUE CROSS/BLUE SHIELD | Admitting: Family Medicine

## 2017-11-01 DIAGNOSIS — M542 Cervicalgia: Secondary | ICD-10-CM

## 2017-11-01 DIAGNOSIS — S46811A Strain of other muscles, fascia and tendons at shoulder and upper arm level, right arm, initial encounter: Secondary | ICD-10-CM

## 2017-11-01 DIAGNOSIS — M25519 Pain in unspecified shoulder: Secondary | ICD-10-CM | POA: Diagnosis not present

## 2017-11-01 MED ORDER — NAPROXEN 500 MG PO TABS: 500.0000 mg | ORAL_TABLET | Freq: Two times a day (BID) | ORAL | 1 refills | Status: DC

## 2017-11-01 MED ORDER — DICLOFENAC SODIUM 1 % TD GEL: 4.0000 g | Freq: Four times a day (QID) | TRANSDERMAL | 1 refills | Status: DC

## 2017-11-01 MED ORDER — CAPSAICIN-MENTHOL-METHYL SAL 0.025-1-12 % EX CREA: 1.0000 "application " | TOPICAL_CREAM | Freq: Four times a day (QID) | CUTANEOUS | 1 refills | Status: AC

## 2017-11-01 MED ORDER — CYCLOBENZAPRINE HCL 10 MG PO TABS: 10.0000 mg | ORAL_TABLET | Freq: Every day | ORAL | 0 refills | Status: DC

## 2017-11-01 NOTE — Progress Notes (Signed)
Subjective  Christina Allen is a 42 y.o. female is presenting with the following  NECK SHOULDER PAIN   Location: Bilateral shoulders and lower neck upper back area.   Course:  Has had R shoulder pain on and off for at least a year.  Was seen last month for this.  Robaxin did not help much and the topical Rxs were not at her pharmacy.  Since her left shoulder and neck have been hurting.  No weakness or radiating type pain  Worse with:  Movement and after sleeping Better with:  Ibuprofen may help a little Trauma: no Swelling:  no Other Joints involved:  no Rash: no Fever:  no  DEPRESSION She feels this is under good control   Chief Complaint noted Review of Symptoms - see HPI PMH - Smoking status noted.    Objective Vital Signs reviewed BP 92/72   Pulse 90   Temp 98.1 F (36.7 C) (Oral)   Ht 5\' 1"  (1.549 m)   Wt 177 lb (80.3 kg)   SpO2 99%   BMI 33.44 kg/m  Psych:  Cognition and judgment appear intact. Alert, communicative  and cooperative with normal attention span and concentration. No apparent delusions, illusions, hallucinations FROM of both shoulder and neck but with pain  Distal strength and sensation are normal in both arms Tender diffusely over upper back and posterior shoulder muscles without focal pain or deformity  Assessments/Plans  See after visit summary for details of patient instuctions  Neck and shoulder pain Worsening now with spread to left shoulder and neck.  Seems most compatible with muscle strain without specific symptoms of nerve impingement or joint inflammation.  Will treat with multiple modalities - see after visit summary

## 2017-11-01 NOTE — Assessment & Plan Note (Signed)
Worsening now with spread to left shoulder and neck.  Seems most compatible with muscle strain without specific symptoms of nerve impingement or joint inflammation.  Will treat with multiple modalities - see after visit summary

## 2017-11-01 NOTE — Patient Instructions (Signed)
Good to see you today!  Thanks for coming in.  For the Neck and Back Pain  Take the naprosyn twice a day as needed with little food Take Tylenol 2 tabs up to 4 x a day Take the flexaril muscle relaxer at night Use the creams 4 x a day  If your insurance does not pay for the cream then use OTC - Aspercreme - 4 x a day - Capascin cream 4 x a day    If you are no better in 6 weeks or if you have weakness then contact us  Consider a chiropracter

## 2017-11-02 ENCOUNTER — Emergency Department (HOSPITAL_COMMUNITY): Payer: BLUE CROSS/BLUE SHIELD

## 2017-11-02 ENCOUNTER — Emergency Department (HOSPITAL_COMMUNITY)
Admission: EM | Admit: 2017-11-02 | Discharge: 2017-11-02 | Disposition: A | Discharge status: Home / Self Care | Payer: BLUE CROSS/BLUE SHIELD | Attending: Emergency Medicine | Admitting: Emergency Medicine

## 2017-11-02 ENCOUNTER — Other Ambulatory Visit: Payer: Self-pay

## 2017-11-02 ENCOUNTER — Encounter (HOSPITAL_COMMUNITY): Payer: Self-pay | Admitting: Emergency Medicine

## 2017-11-02 DIAGNOSIS — M549 Dorsalgia, unspecified: Secondary | ICD-10-CM | POA: Diagnosis present

## 2017-11-02 DIAGNOSIS — M546 Pain in thoracic spine: Secondary | ICD-10-CM | POA: Diagnosis not present

## 2017-11-02 DIAGNOSIS — R0789 Other chest pain: Secondary | ICD-10-CM | POA: Diagnosis not present

## 2017-11-02 LAB — CBC WITH DIFFERENTIAL/PLATELET
Abs Immature Granulocytes: 0 10*3/uL (ref 0.0–0.1)
Basophils Absolute: 0 10*3/uL (ref 0.0–0.1)
Basophils Relative: 1 %
Eosinophils Absolute: 0.1 10*3/uL (ref 0.0–0.7)
Eosinophils Relative: 2 %
HCT: 38 % (ref 36.0–46.0)
Hemoglobin: 12.9 g/dL (ref 12.0–15.0)
Immature Granulocytes: 0 %
Lymphocytes Relative: 42 %
Lymphs Abs: 2.1 10*3/uL (ref 0.7–4.0)
MCH: 31.9 pg (ref 26.0–34.0)
MCHC: 33.9 g/dL (ref 30.0–36.0)
MCV: 93.8 fL (ref 78.0–100.0)
Monocytes Absolute: 0.6 10*3/uL (ref 0.1–1.0)
Monocytes Relative: 12 %
Neutro Abs: 2.1 10*3/uL (ref 1.7–7.7)
Neutrophils Relative %: 43 %
Platelets: 383 10*3/uL (ref 150–400)
RBC: 4.05 MIL/uL (ref 3.87–5.11)
RDW: 12.1 % (ref 11.5–15.5)
WBC: 4.9 10*3/uL (ref 4.0–10.5)

## 2017-11-02 LAB — BASIC METABOLIC PANEL
Anion gap: 7 (ref 5–15)
BUN: 11 mg/dL (ref 6–20)
CO2: 24 mmol/L (ref 22–32)
Calcium: 8.6 mg/dL — ABNORMAL LOW (ref 8.9–10.3)
Chloride: 107 mmol/L (ref 98–111)
Creatinine, Ser: 1.01 mg/dL — ABNORMAL HIGH (ref 0.44–1.00)
GFR calc Af Amer: >60 mL/min (ref >60)
GFR calc non Af Amer: >60 mL/min (ref >60)
Glucose, Bld: 109 mg/dL — ABNORMAL HIGH (ref 70–99)
Potassium: 3.7 mmol/L (ref 3.5–5.1)
Sodium: 138 mmol/L (ref 135–145)

## 2017-11-02 LAB — I-STAT TROPONIN, ED: Troponin i, poc: 0 ng/mL (ref 0.00–0.08)

## 2017-11-02 LAB — D-DIMER, QUANTITATIVE: D-Dimer, Quant: 0.56 ug/mL-FEU — ABNORMAL HIGH (ref 0.00–0.50)

## 2017-11-02 MED ORDER — HYDROCODONE-ACETAMINOPHEN 5-325 MG PO TABS: 1.0000 | ORAL_TABLET | Freq: Four times a day (QID) | ORAL | 0 refills | Status: DC | PRN

## 2017-11-02 MED ORDER — KETOROLAC TROMETHAMINE 30 MG/ML IJ SOLN
30.0000 mg | Freq: Once | INTRAMUSCULAR | Status: AC
  Administered 2017-11-02: 30 mg via INTRAVENOUS
  Filled 2017-11-02: qty 1

## 2017-11-02 MED ORDER — MORPHINE SULFATE (PF) 4 MG/ML IV SOLN
4.0000 mg | Freq: Once | INTRAVENOUS | Status: AC
  Administered 2017-11-02: 4 mg via INTRAVENOUS
  Filled 2017-11-02: qty 1

## 2017-11-02 MED ORDER — MORPHINE SULFATE (PF) 4 MG/ML IV SOLN
4.0000 mg | Freq: Once | INTRAVENOUS | Status: DC
  Filled 2017-11-02: qty 1

## 2017-11-02 MED ORDER — IOPAMIDOL (ISOVUE-370) INJECTION 76%
80.0000 mL | Freq: Once | INTRAVENOUS | Status: AC | PRN
  Administered 2017-11-02: 80 mL via INTRAVENOUS

## 2017-11-02 MED ORDER — SODIUM CHLORIDE 0.9 % IV SOLN
Freq: Once | INTRAVENOUS | Status: AC
  Administered 2017-11-02: 07:00:00 via INTRAVENOUS

## 2017-11-02 NOTE — Discharge Instructions (Addendum)
Continue taking naproxen as previously prescribed.  Hydrocodone is prescribed as needed for pain not relieved with naproxen.  Follow-up with your primary doctor if symptoms are not improving in the next week.

## 2017-11-02 NOTE — ED Notes (Signed)
MD aware of pt's continued hypotension; will continue to monitor and will obtain orthostatics once bolus is complete; pt A&Ox4

## 2017-11-02 NOTE — ED Notes (Signed)
Pt refused EKG until she is given pain meds

## 2017-11-02 NOTE — ED Notes (Addendum)
Pt still wanting pain medicine, stating that "I've been sitting up here for hours w/o any pain medicine"; morphine and Toradol given at 0331; pt states "I don't want no tylenol, that stuff don't help" MD aware of pt's request for pain medicine; MD directed that pt pick up prescription for pain medicine provided, explained to pt that pt has been too hypotensive to administer additional pain medicine safely

## 2017-11-02 NOTE — ED Notes (Signed)
Patient transported to CT 

## 2017-11-02 NOTE — ED Provider Notes (Signed)
[facility] EMERGENCY DEPARTMENT Provider Note   CSN: [id number] Arrival date & time: 11/02/17  0129     History   Chief Complaint Chief Complaint  Patient presents with  . Back Pain  . Shoulder Pain    HPI Christina Allen is a 42 y.o. female.  This patient is a 42 year old female with past medical history of pemphigus vulgaris, endometriosis.  She presents today for evaluation of pain in her back and chest.  This started acutely this evening in the absence of any injury or trauma.  She describes pain across her upper back and left chest that is sharp in nature.  It is worse when she breathes, moves, or lies flat.  She denies any fevers or chills.  She denies any productive cough.  The history is provided by the patient.  Back Pain   This is a new problem. The current episode started 6 to 12 hours ago. The problem occurs constantly. The problem has been rapidly worsening. The pain is associated with no known injury. The pain is present in the thoracic spine. Radiates to: Left chest. The pain is severe. Associated symptoms include chest pain. Pertinent negatives include no fever. She has tried nothing for the symptoms.    Past Medical History:  Diagnosis Date  . Abnormal Pap smear 2004  . Allergy   . Anal fissure   . Bacterial infection   . Dysmenorrhea   . Endometriosis   . GERD (gastroesophageal reflux disease)   . H/O varicella   . Leukorrhea 04/08/2007   Physiologic  . Menorrhagia   . Pemphigus vulgaris   . Preterm labor   . Trichomonas   . Vaginitis   . Yeast infection     Patient Active Problem List   Diagnosis Date Noted  . Neck and shoulder pain 10/15/2017  . Pinguecula of left eye 07/17/2017  . Right shoulder tendinitis 06/24/2017  . Non-intractable vomiting with nausea 06/18/2017  . Dyspepsia 06/18/2017  . Constipation 06/18/2017  . Insomnia 05/20/2017  . Mood disorder ([place]) 04/15/2017  . Rotator cuff syndrome of left shoulder 03/26/2017    . Candidiasis, intertrigo 03/26/2017  . Screening for HIV (human immunodeficiency virus) 03/21/2017  . Stress incontinence in female 11/17/2015  . Macromastia 06/03/2015  . Obesity 04/30/2014  . Allergic rhinitis 01/25/2012  . GERD (gastroesophageal reflux disease) 01/25/2012  . Irritable bowel syndrome 01/25/2012  . Pemphigus vulgaris 01/25/2012  . History of tobacco abuse 01/25/2012  . Annual physical exam 01/25/2012  . History of B12 deficiency 07/25/2011  . Vitamin D deficiency 05/29/2010    Past Surgical History:  Procedure Laterality Date  . CESAREAN SECTION    . DILATION AND CURETTAGE OF UTERUS    . OTHER SURGICAL HISTORY     tubal ligation and c-section  . TUBAL LIGATION    . WISDOM TOOTH EXTRACTION       OB History    Gravida  3   Para  3   Term      Preterm      AB      Living  3     SAB      TAB      Ectopic      Multiple      Live Births  3            Home Medications    Prior to Admission medications   Medication Sig Start Date End Date Taking? Authorizing Provider  acetaminophen (TYLENOL)  500 MG tablet Take 2 tablets (1,000 mg total) by mouth every 8 (eight) hours as needed for mild pain, moderate pain or fever. Take 2 tabs every 8 hours. 03/10/17 03/10/18  Tereasa Coop, PA-C  albuterol (PROVENTIL HFA;VENTOLIN HFA) 108 (90 Base) MCG/ACT inhaler Inhale 2 puffs into the lungs every 6 (six) hours as needed for wheezing or shortness of breath. 11/17/15   [name], MD  calcium-vitamin D (OSCAL WITH D) 500-200 MG-UNIT tablet Take 1 tablet by mouth daily with breakfast.    [provider]  Capsaicin-Menthol-Methyl Sal (CAPSAICIN-METHYL SAL-MENTHOL) 0.025-1-12 % CREA Apply 1 application topically 4 (four) times daily. 11/01/17   Lind Covert, MD  cyclobenzaprine (FLEXERIL) 10 MG tablet Take 1 tablet (10 mg total) by mouth at bedtime. 11/01/17   Lind Covert, MD  diclofenac sodium (VOLTAREN) 1 % GEL Apply 4 g  topically 4 (four) times daily. 11/01/17   Lind Covert, MD  fluticasone (FLONASE) 50 MCG/ACT nasal spray Place 2 sprays into both nostrils daily. 03/26/17   [name], MD  loratadine (CLARITIN) 10 MG tablet Take 1 tablet (10 mg total) by mouth daily. 04/02/17   [name], MD  naproxen (NAPROSYN) 500 MG tablet Take 1 tablet (500 mg total) by mouth 2 (two) times daily with a meal. 11/01/17   Chambliss, Jeb Levering, MD  nystatin cream (MYCOSTATIN) Apply 1 application topically 2 (two) times daily. 03/26/17   [name], MD  pantoprazole (PROTONIX) 40 MG tablet Take 1 tablet (40 mg total) by mouth daily. Stop ranitidine. 06/18/17   [name], MD  polyethylene glycol powder (GLYCOLAX/MIRALAX) powder Mix 17 gm with glass full of water and take 2-3 times a day 06/18/17   [name], MD  sertraline (ZOLOFT) 50 MG tablet TAKE 1 TABLET(50 MG) BY MOUTH DAILY 05/20/17   [name], MD  Vitamin D, Ergocalciferol, (DRISDOL) 50000 units CAPS capsule Take 1 capsule (50,000 Units total) by mouth every 7 (seven) days. 04/03/17   [name], MD    Family History Family History  Problem Relation Age of Onset  . COPD Maternal Grandmother        Emphysema  . Cancer Maternal Grandmother   . Cancer Maternal Aunt   . Cancer Paternal Grandmother   . Hypertension Mother   . Kidney disease Mother   . Hypertension Father   . Alcohol abuse Father   . Alcohol abuse Sister   . Alcohol abuse Brother   . Diabetes Maternal Uncle     Social History Social History   Tobacco Use  . Smoking status: Former Smoker    Last attempt to quit: 03/06/2003    Years since quitting: 14.6  . Smokeless tobacco: Never Used  Substance Use Topics  . Alcohol use: No  . Drug use: No     Allergies   Shellfish-derived products   Review of Systems Review of Systems  Constitutional: Negative for fever.  Cardiovascular: Positive for chest pain.  Musculoskeletal: Positive for back pain.  All other systems  reviewed and are negative.    Physical Exam Updated Vital Signs BP 112/88 (BP Location: Right Arm)   Pulse (!) 105   Temp 97.9 F (36.6 C)   Resp 20   SpO2 95%   Physical Exam  Constitutional: She is oriented to person, place, and time. She appears well-developed and well-nourished. No distress.  HENT:  Head: Normocephalic and atraumatic.  Neck: Normal range of motion.  Neck supple.  Cardiovascular: Normal rate and regular rhythm. Exam reveals no gallop and no friction rub.  No murmur heard. Pulmonary/Chest: Effort normal and breath sounds normal. No respiratory distress. She has no wheezes.  Abdominal: Soft. Bowel sounds are normal. She exhibits no distension. There is no tenderness.  Musculoskeletal: Normal range of motion.  Neurological: She is alert and oriented to person, place, and time.  Skin: Skin is warm and dry. She is not diaphoretic.  Nursing note and vitals reviewed.    ED Treatments / Results  Labs (all labs ordered are listed, but only abnormal results are displayed) Labs Reviewed  BASIC METABOLIC PANEL  CBC WITH DIFFERENTIAL/PLATELET  D-DIMER, QUANTITATIVE (NOT AT [facility])  I-STAT TROPONIN, ED    EKG None  Radiology No results found.  Procedures Procedures (including critical care time)  Medications Ordered in ED Medications  ketorolac (TORADOL) 30 MG/ML injection 30 mg (has no administration in time range)  morphine 4 MG/ML injection 4 mg (has no administration in time range)     Initial Impression / Assessment and Plan / ED Course  I have reviewed the triage vital signs and the nursing notes.  Pertinent labs & imaging results that were available during my care of the patient were reviewed by me and considered in my medical decision making (see chart for details).  Patient presents here with complaints of severe pain in her upper back radiating into her left chest.  This began earlier this evening.  She is tender to palpation in the soft  tissues in this area, however there are no palpable abnormalities.  Her laboratory studies including troponin are unremarkable.  Her EKG shows no evidence of a cardiac etiology.  D-dimer is mildly positive and CT scan does not show pulmonary embolism or other obvious abnormality.  I am uncertain as to the exact etiology of this patient's pain, however nothing today appears emergent.  I feel as though she is appropriate for discharge.  She will be given anti-inflammatories, medicine for her pain, and is to follow-up with her primary doctor if not improving.  The [place] has been reviewed and the patient has had one prescription for controlled substances during 2019.  Final Clinical Impressions(s) / ED Diagnoses   Final diagnoses:  None    ED Discharge Orders    None       Veryl Speak, MD 11/02/17 [phone]

## 2017-11-02 NOTE — ED Triage Notes (Signed)
Reports intermittent pain to R shoulder x 2 weeks.  States pain moved to center of upper back for the past 2-3 days and is now in L shoulder.  Seen by PCP and started on Naproxen, Flexeril, and cream.  Pain worse when she sits up straight.  No known injury.

## 2017-11-02 NOTE — ED Notes (Signed)
priior to administering morphine, pts bp checked and found to be in the 80's. Morphine held. Dr Stark Jock made aware of hypotension, gave verbal order for 1 liter bolus of normal saline.

## 2017-11-02 NOTE — ED Notes (Signed)
Ct contacted regarding when they will be over to take patient over for scan, advised they had been waiting on patient's labs and will be over to get her soon

## 2017-11-02 NOTE — ED Notes (Signed)
Patient requesting something else for pain, Dr. Stark Jock made aware, verbal order for 4mg  morphine given to this RN by Dr. Stark Jock.

## 2017-11-04 ENCOUNTER — Ambulatory Visit (HOSPITAL_COMMUNITY)
Admission: EM | Admit: 2017-11-04 | Discharge: 2017-11-04 | Disposition: A | Discharge status: Home / Self Care | Payer: BLUE CROSS/BLUE SHIELD | Attending: Family Medicine | Admitting: Family Medicine

## 2017-11-04 ENCOUNTER — Encounter (HOSPITAL_COMMUNITY): Payer: Self-pay

## 2017-11-04 DIAGNOSIS — T7840XA Allergy, unspecified, initial encounter: Secondary | ICD-10-CM | POA: Diagnosis not present

## 2017-11-04 DIAGNOSIS — L500 Allergic urticaria: Secondary | ICD-10-CM

## 2017-11-04 MED ORDER — METHYLPREDNISOLONE SODIUM SUCC 125 MG IJ SOLR
INTRAMUSCULAR | Status: AC
  Filled 2017-11-04: qty 2

## 2017-11-04 MED ORDER — TRAMADOL HCL 50 MG PO TABS: 50.0000 mg | ORAL_TABLET | Freq: Four times a day (QID) | ORAL | 0 refills | Status: DC | PRN

## 2017-11-04 MED ORDER — METHYLPREDNISOLONE SODIUM SUCC 125 MG IJ SOLR
80.0000 mg | Freq: Once | INTRAMUSCULAR | Status: AC
  Administered 2017-11-04: 80 mg via INTRAMUSCULAR

## 2017-11-04 MED ORDER — PREDNISONE 10 MG (21) PO TBPK: ORAL_TABLET | ORAL | 0 refills | Status: DC

## 2017-11-04 NOTE — ED Triage Notes (Signed)
Pt presents with possible allergic reaction, not sure if its to a new medication or changed detergent.

## 2017-11-04 NOTE — Discharge Instructions (Addendum)
It was nice meeting you!!  I believe that you are having an allergic reaction to the hydrocodone. We will give you a steroid shot in clinic. Please stop taking the hydrocodone and we will change your pain medication.  Take Benadryl or Zyrtec as needed for itching. Follow up as needed for continued or worsening symptoms

## 2017-11-04 NOTE — ED Provider Notes (Signed)
[place]    CSN: [id number] Arrival date & time: 11/04/17  1350     History   Chief Complaint Chief Complaint  Patient presents with  . Allergic Reaction    all over     HPI Christina Allen is a 42 y.o. female.   Patient is a 42 year old female presents for possible allergic reaction.  She woke up with hives and itching this morning.  Symptoms have been constant and worsening.  She took benadryl with minimal relief.  Denies any trouble swallowing, breathing.  She did take hydrocodone last night before bed for back pain.  This is the first time she has taken hydrocodone.  she is also recently started using new detergent.  She denies any new lotions, soaps.  She denies any contact with plants, insects.  Nobody else at home has a rash.   She is a former smoker Past family history of cancer, COPD, diabetes, EtOH abuse hypertension.   ROS per HPI      Past Medical History:  Diagnosis Date  . Abnormal Pap smear 2004  . Allergy   . Anal fissure   . Bacterial infection   . Dysmenorrhea   . Endometriosis   . GERD (gastroesophageal reflux disease)   . H/O varicella   . Leukorrhea 04/08/2007   Physiologic  . Menorrhagia   . Pemphigus vulgaris   . Preterm labor   . Trichomonas   . Vaginitis   . Yeast infection     Patient Active Problem List   Diagnosis Date Noted  . Neck and shoulder pain 10/15/2017  . Pinguecula of left eye 07/17/2017  . Right shoulder tendinitis 06/24/2017  . Non-intractable vomiting with nausea 06/18/2017  . Dyspepsia 06/18/2017  . Constipation 06/18/2017  . Insomnia 05/20/2017  . Mood disorder ([place]) 04/15/2017  . Rotator cuff syndrome of left shoulder 03/26/2017  . Candidiasis, intertrigo 03/26/2017  . Screening for HIV (human immunodeficiency virus) 03/21/2017  . Stress incontinence in female 11/17/2015  . Macromastia 06/03/2015  . Obesity 04/30/2014  . Allergic rhinitis 01/25/2012  . GERD (gastroesophageal reflux disease)  01/25/2012  . Irritable bowel syndrome 01/25/2012  . Pemphigus vulgaris 01/25/2012  . History of tobacco abuse 01/25/2012  . Annual physical exam 01/25/2012  . History of B12 deficiency 07/25/2011  . Vitamin D deficiency 05/29/2010    Past Surgical History:  Procedure Laterality Date  . CESAREAN SECTION    . DILATION AND CURETTAGE OF UTERUS    . OTHER SURGICAL HISTORY     tubal ligation and c-section  . TUBAL LIGATION    . WISDOM TOOTH EXTRACTION      OB History    Gravida  3   Para  3   Term      Preterm      AB      Living  3     SAB      TAB      Ectopic      Multiple      Live Births  3            Home Medications    Prior to Admission medications   Medication Sig Start Date End Date Taking? Authorizing Provider  acetaminophen (TYLENOL) 500 MG tablet Take 2 tablets (1,000 mg total) by mouth every 8 (eight) hours as needed for mild pain, moderate pain or fever. Take 2 tabs every 8 hours. 03/10/17 03/10/18  Tereasa Coop, PA-C  albuterol (PROVENTIL HFA;VENTOLIN HFA) 108 (90  Base) MCG/ACT inhaler Inhale 2 puffs into the lungs every 6 (six) hours as needed for wheezing or shortness of breath. 11/17/15   [name], MD  calcium-vitamin D (OSCAL WITH D) 500-200 MG-UNIT tablet Take 1 tablet by mouth daily with breakfast.    [provider]  Capsaicin-Menthol-Methyl Sal (CAPSAICIN-METHYL SAL-MENTHOL) 0.025-1-12 % CREA Apply 1 application topically 4 (four) times daily. 11/01/17   Lind Covert, MD  cyclobenzaprine (FLEXERIL) 10 MG tablet Take 1 tablet (10 mg total) by mouth at bedtime. 11/01/17   Lind Covert, MD  diclofenac sodium (VOLTAREN) 1 % GEL Apply 4 g topically 4 (four) times daily. 11/01/17   Lind Covert, MD  fluticasone (FLONASE) 50 MCG/ACT nasal spray Place 2 sprays into both nostrils daily. 03/26/17   [name], MD  loratadine (CLARITIN) 10 MG tablet Take 1 tablet (10 mg total) by mouth daily. 04/02/17   [name], MD  naproxen (NAPROSYN) 500 MG tablet Take 1 tablet (500 mg total) by mouth 2 (two) times daily with a meal. 11/01/17   Chambliss, Jeb Levering, MD  nystatin cream (MYCOSTATIN) Apply 1 application topically 2 (two) times daily. 03/26/17   [name], MD  pantoprazole (PROTONIX) 40 MG tablet Take 1 tablet (40 mg total) by mouth daily. Stop ranitidine. 06/18/17   [name], MD  polyethylene glycol powder (GLYCOLAX/MIRALAX) powder Mix 17 gm with glass full of water and take 2-3 times a day 06/18/17   [name], MD  predniSONE (STERAPRED UNI-PAK 21 TAB) 10 MG (21) TBPK tablet 6 tabs for 1 day, then 5 tabs for 1 das, then 4 tabs for 1 day, then 3 tabs for 1 day, 2 tabs for 1 day, then 1 tab for 1 day 11/04/17   Loura Halt A, NP  sertraline (ZOLOFT) 50 MG tablet TAKE 1 TABLET(50 MG) BY MOUTH DAILY 05/20/17   [name], MD  traMADol (ULTRAM) 50 MG tablet Take 1 tablet (50 mg total) by mouth every 6 (six) hours as needed. 11/04/17   Loura Halt A, NP  Vitamin D, Ergocalciferol, (DRISDOL) 50000 units CAPS capsule Take 1 capsule (50,000 Units total) by mouth every 7 (seven) days. 04/03/17   [name], MD    Family History Family History  Problem Relation Age of Onset  . COPD Maternal Grandmother        Emphysema  . Cancer Maternal Grandmother   . Cancer Maternal Aunt   . Cancer Paternal Grandmother   . Hypertension Mother   . Kidney disease Mother   . Hypertension Father   . Alcohol abuse Father   . Alcohol abuse Sister   . Alcohol abuse Brother   . Diabetes Maternal Uncle     Social History Social History   Tobacco Use  . Smoking status: Former Smoker    Last attempt to quit: 03/06/2003    Years since quitting: 14.6  . Smokeless tobacco: Never Used  Substance Use Topics  . Alcohol use: No  . Drug use: No     Allergies   Shellfish-derived products   Review of Systems Review of Systems   Physical Exam Triage Vital Signs ED Triage Vitals  Enc Vitals Group      BP 11/04/17 1357 101/74     Pulse Rate 11/04/17 1357 100     Resp 11/04/17 1357 19     Temp 11/04/17 1357 97.8 F (36.6 C)     Temp Source 11/04/17 1357  Oral     SpO2 11/04/17 1357 95 %     Weight --      Height --      Head Circumference --      Peak Flow --      Pain Score 11/04/17 1356 0     Pain Loc --      Pain Edu? --      Excl. in [place]? --    No data found.  Updated Vital Signs BP 101/74 (BP Location: Right Arm)   Pulse 100   Temp 97.8 F (36.6 C) (Oral)   Resp 19   SpO2 95%   Visual Acuity Right Eye Distance:   Left Eye Distance:   Bilateral Distance:    Right Eye Near:   Left Eye Near:    Bilateral Near:     Physical Exam  Constitutional: She is oriented to person, place, and time. She appears well-developed and well-nourished.  Very pleasant.  Nontoxic or ill-appearing.  No distress  HENT:  Head: Normocephalic and atraumatic.  Nose: Nose normal.  Eyes: Conjunctivae are normal.  Neck: Normal range of motion.  Pulmonary/Chest: Effort normal and breath sounds normal.  Musculoskeletal: Normal range of motion.  Neurological: She is alert and oriented to person, place, and time.  Skin: Skin is warm and dry.  Diffuse erythematous rash to bilateral arms face and neck.   Psychiatric: She has a normal mood and affect.  Nursing note and vitals reviewed.    UC Treatments / Results  Labs (all labs ordered are listed, but only abnormal results are displayed) Labs Reviewed - No data to display  EKG None  Radiology No results found.  Procedures Procedures (including critical care time)  Medications Ordered in UC Medications  methylPREDNISolone sodium succinate (SOLU-MEDROL) 125 mg/2 mL injection 80 mg (80 mg Intramuscular Given 11/04/17 1450)    Initial Impression / Assessment and Plan / UC Course  I have reviewed the triage vital signs and the nursing notes.  Pertinent labs & imaging results that were available during my care of the patient were  reviewed by me and considered in my medical decision making (see chart for details).     Allergic reaction-most likely from the hydrocodone she took last night. We will do steroid shot in clinic and 6-day prednisone taper. Zyrtec and Benadryl for itching Follow up as needed for continued or worsening symptoms  Final Clinical Impressions(s) / UC Diagnoses   Final diagnoses:  Allergic reaction, initial encounter     Discharge Instructions     It was nice meeting you!!  I believe that you are having an allergic reaction to the hydrocodone. We will give you a steroid shot in clinic. Please stop taking the hydrocodone and we will change your pain medication.  Take Benadryl or Zyrtec as needed for itching. Follow up as needed for continued or worsening symptoms    ED Prescriptions    Medication Sig Dispense Auth. Provider   predniSONE (STERAPRED UNI-PAK 21 TAB) 10 MG (21) TBPK tablet 6 tabs for 1 day, then 5 tabs for 1 das, then 4 tabs for 1 day, then 3 tabs for 1 day, 2 tabs for 1 day, then 1 tab for 1 day 21 tablet Dessie Tatem A, NP   traMADol (ULTRAM) 50 MG tablet Take 1 tablet (50 mg total) by mouth every 6 (six) hours as needed. 15 tablet Loura Halt A, NP     Controlled Substance Prescriptions  Controlled Substance Registry consulted? Not  Applicable   Orvan July, NP 11/04/17 1458

## 2017-11-12 ENCOUNTER — Telehealth: Payer: Self-pay

## 2017-11-12 NOTE — Telephone Encounter (Signed)
Received fax from pharmacy, PA needed on diclofenac gel.  Clinical questions submitted via Cover My Meds.  Waiting on response, could take up to 72 hours.  Cover My Meds info: Key: [id number]  Wallace Cullens, RN

## 2018-02-04 ENCOUNTER — Telehealth: Payer: Self-pay | Admitting: Family Medicine

## 2018-02-04 NOTE — Telephone Encounter (Signed)
Pt is calling concerning her prescription for nystatin cream. She uses it under breast and the cream does not seem to be working anymore. She would like to know if she can have a powder sent instead.

## 2018-02-05 ENCOUNTER — Other Ambulatory Visit: Payer: Self-pay

## 2018-02-05 ENCOUNTER — Other Ambulatory Visit: Payer: Self-pay | Admitting: Family Medicine

## 2018-02-05 ENCOUNTER — Encounter (HOSPITAL_COMMUNITY): Payer: Self-pay | Admitting: Emergency Medicine

## 2018-02-05 ENCOUNTER — Ambulatory Visit (HOSPITAL_COMMUNITY)
Admission: EM | Admit: 2018-02-05 | Discharge: 2018-02-05 | Disposition: A | Discharge status: Home / Self Care | Payer: BLUE CROSS/BLUE SHIELD | Attending: Family Medicine | Admitting: Family Medicine

## 2018-02-05 DIAGNOSIS — R0981 Nasal congestion: Secondary | ICD-10-CM | POA: Diagnosis not present

## 2018-02-05 DIAGNOSIS — J3489 Other specified disorders of nose and nasal sinuses: Secondary | ICD-10-CM

## 2018-02-05 MED ORDER — CETIRIZINE-PSEUDOEPHEDRINE ER 5-120 MG PO TB12: 1.0000 | ORAL_TABLET | Freq: Every day | ORAL | 0 refills | Status: DC

## 2018-02-05 MED ORDER — OXYMETAZOLINE HCL 0.05 % NA SOLN: 1.0000 | Freq: Two times a day (BID) | NASAL | 0 refills | Status: AC

## 2018-02-05 MED ORDER — NYSTATIN 100000 UNIT/GM EX POWD: Freq: Four times a day (QID) | CUTANEOUS | 1 refills | Status: AC

## 2018-02-05 NOTE — ED Triage Notes (Signed)
Pt reports waking up this morning with sinus congestion and pressure.  She also reports that for a very long time she has not been able to breathe at night due to sinus congestion.

## 2018-02-05 NOTE — Telephone Encounter (Signed)
I sent in the prescription for nystatin powder.  Thanks.

## 2018-02-05 NOTE — ED Provider Notes (Signed)
[place]   [id number] 02/05/18 Arrival Time: 7510   CC: Sinus pain and pressure  SUBJECTIVE: History from: patient.  Christina Allen is a 42 y.o. female who presents with nasal congestion, sinus pain and pressure that began yesterday.  Denies positive sick exposure or precipitating event.  Takes flonase and claritin daily for chronic sinus issues without relief of current symptoms.  Symptoms are made worse at night.  Denies fever, chills, fatigue, rhinorrhea, sore throat, SOB, wheezing, chest pain, nausea, changes in bowel or bladder habits.     ROS: As per HPI.  Past Medical History:  Diagnosis Date  . Abnormal Pap smear 2004  . Allergy   . Anal fissure   . Bacterial infection   . Dysmenorrhea   . Endometriosis   . GERD (gastroesophageal reflux disease)   . H/O varicella   . Leukorrhea 04/08/2007   Physiologic  . Menorrhagia   . Pemphigus vulgaris   . Preterm labor   . Trichomonas   . Vaginitis   . Yeast infection    Past Surgical History:  Procedure Laterality Date  . CESAREAN SECTION    . DILATION AND CURETTAGE OF UTERUS    . OTHER SURGICAL HISTORY     tubal ligation and c-section  . TUBAL LIGATION    . WISDOM TOOTH EXTRACTION     Allergies  Allergen Reactions  . Shellfish-Derived Products Swelling    Mouth swelling Mouth swelling   No current facility-administered medications on file prior to encounter.    Current Outpatient Medications on File Prior to Encounter  Medication Sig Dispense Refill  . montelukast (SINGULAIR) 10 MG tablet Take 10 mg by mouth at bedtime.    . pantoprazole (PROTONIX) 40 MG tablet Take 1 tablet (40 mg total) by mouth daily. Stop ranitidine. 90 tablet 0  . acetaminophen (TYLENOL) 500 MG tablet Take 2 tablets (1,000 mg total) by mouth every 8 (eight) hours as needed for mild pain, moderate pain or fever. Take 2 tabs every 8 hours. 90 tablet prn  . albuterol (PROVENTIL HFA;VENTOLIN HFA) 108 (90 Base) MCG/ACT inhaler Inhale 2  puffs into the lungs every 6 (six) hours as needed for wheezing or shortness of breath. 1 Inhaler 0  . calcium-vitamin D (OSCAL WITH D) 500-200 MG-UNIT tablet Take 1 tablet by mouth daily with breakfast.    . Capsaicin-Menthol-Methyl Sal (CAPSAICIN-METHYL SAL-MENTHOL) 0.025-1-12 % CREA Apply 1 application topically 4 (four) times daily. 1 Tube 1  . cyclobenzaprine (FLEXERIL) 10 MG tablet Take 1 tablet (10 mg total) by mouth at bedtime. 30 tablet 0  . diclofenac sodium (VOLTAREN) 1 % GEL Apply 4 g topically 4 (four) times daily. 1 Tube 1  . fluticasone (FLONASE) 50 MCG/ACT nasal spray Place 2 sprays into both nostrils daily. 16 g 6  . naproxen (NAPROSYN) 500 MG tablet Take 1 tablet (500 mg total) by mouth 2 (two) times daily with a meal. 60 tablet 1  . nystatin cream (MYCOSTATIN) Apply 1 application topically 2 (two) times daily. 30 g 0  . polyethylene glycol powder (GLYCOLAX/MIRALAX) powder Mix 17 gm with glass full of water and take 2-3 times a day 525 g 1  . sertraline (ZOLOFT) 50 MG tablet TAKE 1 TABLET(50 MG) BY MOUTH DAILY 90 tablet 0  . traMADol (ULTRAM) 50 MG tablet Take 1 tablet (50 mg total) by mouth every 6 (six) hours as needed. 15 tablet 0  . Vitamin D, Ergocalciferol, (DRISDOL) 50000 units CAPS capsule Take 1 capsule (50,000  Units total) by mouth every 7 (seven) days. 8 capsule 0   Social History   Socioeconomic History  . Marital status: Single    Spouse name: Not on file  . Number of children: Not on file  . Years of education: Not on file  . Highest education level: Not on file  Occupational History  . Not on file  Social Needs  . Financial resource strain: Not on file  . Food insecurity:    Worry: Not on file    Inability: Not on file  . Transportation needs:    Medical: Not on file    Non-medical: Not on file  Tobacco Use  . Smoking status: Former Smoker    Last attempt to quit: 03/06/2003    Years since quitting: 14.9  . Smokeless tobacco: Never Used  Substance  and Sexual Activity  . Alcohol use: No  . Drug use: No  . Sexual activity: Never    Birth control/protection: None, Implant  Lifestyle  . Physical activity:    Days per week: Not on file    Minutes per session: Not on file  . Stress: Not on file  Relationships  . Social connections:    Talks on phone: Not on file    Gets together: Not on file    Attends religious service: Not on file    Active member of club or organization: Not on file    Attends meetings of clubs or organizations: Not on file    Relationship status: Not on file  . Intimate partner violence:    Fear of current or ex partner: Not on file    Emotionally abused: Not on file    Physically abused: Not on file    Forced sexual activity: Not on file  Other Topics Concern  . Not on file  Social History Narrative   Lives with children    Quit tobacco 2005 (after 10 years)   Single   Family History  Problem Relation Age of Onset  . COPD Maternal Grandmother        Emphysema  . Cancer Maternal Grandmother   . Cancer Maternal Aunt   . Cancer Paternal Grandmother   . Hypertension Mother   . Kidney disease Mother   . Hypertension Father   . Alcohol abuse Father   . Alcohol abuse Sister   . Alcohol abuse Brother   . Diabetes Maternal Uncle     OBJECTIVE:  Vitals:   02/05/18 1458  BP: 111/77  Pulse: 99  Temp: 97.6 F (36.4 C)  TempSrc: Oral  SpO2: 96%     General appearance: alert; appears mildly fatigued, but nontoxic; speaking in full sentences and tolerating own secretions HEENT: NCAT; Ears: EACs clear, TMs pearly gray; Eyes: PERRL.  EOM grossly intact. Sinuses: tender over frontal and maxillary sinuses; Nose: nares patent, erythematous with boggy turbinates, Throat: oropharynx clear, tonsils non erythematous or enlarged, uvula midline  Neck: supple without LAD Lungs: unlabored respirations, symmetrical air entry; cough: absent; no respiratory distress; CTAB Heart: regular rate and rhythm.  Radial  pulses 2+ symmetrical bilaterally Skin: warm and dry Psychological: alert and cooperative; normal mood and affect  ASSESSMENT & PLAN:  1. Sinus pressure   2. Congestion of nasal sinus     Meds ordered this encounter  Medications  . cetirizine-pseudoephedrine (ZYRTEC-D) 5-120 MG tablet    Sig: Take 1 tablet by mouth daily.    Dispense:  30 tablet    Refill:  0  Order Specific Question:   Supervising Provider    Answer:   Raylene Everts [[id number]]  . oxymetazoline (AFRIN NASAL SPRAY) 0.05 % nasal spray    Sig: Place 1 spray into both nostrils 2 (two) times daily for 3 days.    Dispense:  15 mL    Refill:  0    Order Specific Question:   Supervising Provider    Answer:   Raylene Everts [[id number]]    Get plenty of rest and push fluids Continue with flonase daily.  Discontinue claritin we will try zyrtec D Afrin nasal spray prescribed.  Use as directed.  Do not use longer than 3 day or you may experience rebound effect Use medications daily for symptom relief Use OTC medications like ibuprofen or tylenol as needed fever or pain Follow up with PCP for appointment next Monday Return or go to ER if you have any new or worsening symptoms fever, chills, nausea, vomiting, chest pain, cough, shortness of breath, wheezing, abdominal pain, changes in bowel or bladder habits, etc...  Reviewed expectations re: course of current medical issues. Questions answered. Outlined signs and symptoms indicating need for more acute intervention. Patient verbalized understanding. After Visit Summary given.         Lestine Box, PA-C 02/05/18 [phone]

## 2018-02-05 NOTE — Discharge Instructions (Addendum)
Get plenty of rest and push fluids Continue with flonase daily.  Discontinue claritin we will try zyrtec D Afrin nasal spray prescribed.  Use as directed.  Do not use longer than 3 day or you may experience rebound effect Use medications daily for symptom relief Use OTC medications like ibuprofen or tylenol as needed fever or pain Follow up with PCP for appointment next Monday Return or go to ER if you have any new or worsening symptoms fever, chills, nausea, vomiting, chest pain, cough, shortness of breath, wheezing, abdominal pain, changes in bowel or bladder habits, etc..Marland Kitchen

## 2018-02-07 ENCOUNTER — Ambulatory Visit: Payer: Medicaid Other

## 2018-02-07 NOTE — Telephone Encounter (Signed)
LVM to return call to inform her of below. Katharina Caper, April D, [place]

## 2018-04-30 ENCOUNTER — Telehealth: Payer: Self-pay

## 2018-04-30 ENCOUNTER — Other Ambulatory Visit: Payer: Self-pay | Admitting: Family Medicine

## 2018-04-30 MED ORDER — LORATADINE 10 MG PO TABS: 10.0000 mg | ORAL_TABLET | Freq: Every day | ORAL | 11 refills | Status: AC

## 2018-04-30 NOTE — Telephone Encounter (Signed)
Loratadine ordered.  Thanks!

## 2018-04-30 NOTE — Telephone Encounter (Signed)
Fax for refill Loratadine, not on current med list.  Danley Danker, RN [facility] RN)

## 2018-05-01 ENCOUNTER — Other Ambulatory Visit: Payer: Self-pay

## 2018-05-01 ENCOUNTER — Encounter (HOSPITAL_COMMUNITY): Payer: Self-pay

## 2018-05-01 ENCOUNTER — Emergency Department (HOSPITAL_COMMUNITY)
Admission: EM | Admit: 2018-05-01 | Discharge: 2018-05-02 | Disposition: A | Discharge status: Home / Self Care | Payer: BLUE CROSS/BLUE SHIELD | Attending: Emergency Medicine | Admitting: Emergency Medicine

## 2018-05-01 DIAGNOSIS — T7840XA Allergy, unspecified, initial encounter: Secondary | ICD-10-CM | POA: Diagnosis not present

## 2018-05-01 DIAGNOSIS — Z87891 Personal history of nicotine dependence: Secondary | ICD-10-CM | POA: Insufficient documentation

## 2018-05-01 DIAGNOSIS — L509 Urticaria, unspecified: Secondary | ICD-10-CM | POA: Diagnosis present

## 2018-05-01 DIAGNOSIS — Z79899 Other long term (current) drug therapy: Secondary | ICD-10-CM | POA: Insufficient documentation

## 2018-05-01 NOTE — ED Triage Notes (Signed)
Pt here with itching all over after eating crab legs.  Pt has hx of allergy to shellfish.  25 mg benadryl taken PTA.  No shortness of breath or wheezing.  A&Ox4.

## 2018-05-02 MED ORDER — DIPHENHYDRAMINE HCL 50 MG/ML IJ SOLN
25.0000 mg | Freq: Once | INTRAMUSCULAR | Status: AC
  Administered 2018-05-02: 25 mg via INTRAVENOUS
  Filled 2018-05-02: qty 1

## 2018-05-02 MED ORDER — FAMOTIDINE IN NACL 20-0.9 MG/50ML-% IV SOLN
20.0000 mg | INTRAVENOUS | Status: AC
  Administered 2018-05-02: 20 mg via INTRAVENOUS
  Filled 2018-05-02: qty 50

## 2018-05-02 MED ORDER — METHYLPREDNISOLONE SODIUM SUCC 125 MG IJ SOLR
125.0000 mg | Freq: Once | INTRAMUSCULAR | Status: AC
  Administered 2018-05-02: 125 mg via INTRAVENOUS
  Filled 2018-05-02: qty 2

## 2018-05-02 NOTE — ED Provider Notes (Signed)
[facility] EMERGENCY DEPARTMENT Provider Note   CSN: [id number] Arrival date & time: 05/01/18  2350    History   Chief Complaint Chief Complaint  Patient presents with  . Allergic Reaction    HPI Christina Allen is a 43 y.o. female.     43 year old female presents to the emergency department for evaluation of allergic reaction.  She reports eating [place] crab legs at 1800.  Began noticing hives to her body and diffuse itching approximately 1 hour later.  She took 50 mg Benadryl and 1 tablet of prednisone at 1900.  States that she has noted persistent itching, though her hives have largely resolved.  No difficulty breathing or difficulty swallowing.  Denies any fevers, lip or tongue swelling, vomiting.  Has a known allergy to shellfish.  The history is provided by the patient. No language interpreter was used.  Allergic Reaction    Past Medical History:  Diagnosis Date  . Abnormal Pap smear 2004  . Allergy   . Anal fissure   . Bacterial infection   . Dysmenorrhea   . Endometriosis   . GERD (gastroesophageal reflux disease)   . H/O varicella   . Leukorrhea 04/08/2007   Physiologic  . Menorrhagia   . Pemphigus vulgaris   . Preterm labor   . Trichomonas   . Vaginitis   . Yeast infection     Patient Active Problem List   Diagnosis Date Noted  . Neck and shoulder pain 10/15/2017  . Pinguecula of left eye 07/17/2017  . Right shoulder tendinitis 06/24/2017  . Non-intractable vomiting with nausea 06/18/2017  . Dyspepsia 06/18/2017  . Constipation 06/18/2017  . Insomnia 05/20/2017  . Mood disorder ([place]) 04/15/2017  . Rotator cuff syndrome of left shoulder 03/26/2017  . Candidiasis, intertrigo 03/26/2017  . Screening for HIV (human immunodeficiency virus) 03/21/2017  . Stress incontinence in female 11/17/2015  . Macromastia 06/03/2015  . Obesity 04/30/2014  . Allergic rhinitis 01/25/2012  . GERD (gastroesophageal reflux disease) 01/25/2012  . Irritable  bowel syndrome 01/25/2012  . Pemphigus vulgaris 01/25/2012  . History of tobacco abuse 01/25/2012  . Annual physical exam 01/25/2012  . History of B12 deficiency 07/25/2011  . Vitamin D deficiency 05/29/2010    Past Surgical History:  Procedure Laterality Date  . CESAREAN SECTION    . DILATION AND CURETTAGE OF UTERUS    . OTHER SURGICAL HISTORY     tubal ligation and c-section  . TUBAL LIGATION    . WISDOM TOOTH EXTRACTION       OB History    Gravida  3   Para  3   Term      Preterm      AB      Living  3     SAB      TAB      Ectopic      Multiple      Live Births  3            Home Medications    Prior to Admission medications   Medication Sig Start Date End Date Taking? Authorizing Provider  albuterol (PROVENTIL HFA;VENTOLIN HFA) 108 (90 Base) MCG/ACT inhaler Inhale 2 puffs into the lungs every 6 (six) hours as needed for wheezing or shortness of breath. 11/17/15   [name], MD  calcium-vitamin D (OSCAL WITH D) 500-200 MG-UNIT tablet Take 1 tablet by mouth daily with breakfast.    [provider]  Capsaicin-Menthol-Methyl Sal (CAPSAICIN-METHYL SAL-MENTHOL) 0.025-1-12 %  CREA Apply 1 application topically 4 (four) times daily. 11/01/17   Lind Covert, MD  cetirizine-pseudoephedrine (ZYRTEC-D) 5-120 MG tablet Take 1 tablet by mouth daily. 02/05/18   Wurst, [place], PA-C  cyclobenzaprine (FLEXERIL) 10 MG tablet Take 1 tablet (10 mg total) by mouth at bedtime. 11/01/17   Lind Covert, MD  diclofenac sodium (VOLTAREN) 1 % GEL Apply 4 g topically 4 (four) times daily. 11/01/17   Lind Covert, MD  fluticasone (FLONASE) 50 MCG/ACT nasal spray Place 2 sprays into both nostrils daily. 03/26/17   [name], MD  loratadine (CLARITIN) 10 MG tablet Take 1 tablet (10 mg total) by mouth daily. 04/30/18   Kathrene Alu, MD  montelukast (SINGULAIR) 10 MG tablet Take 10 mg by mouth at bedtime.    [provider]  naproxen  (NAPROSYN) 500 MG tablet Take 1 tablet (500 mg total) by mouth 2 (two) times daily with a meal. 11/01/17   Chambliss, Jeb Levering, MD  nystatin (MYCOSTATIN/NYSTOP) powder Apply topically 4 (four) times daily. 02/05/18   Kathrene Alu, MD  pantoprazole (PROTONIX) 40 MG tablet Take 1 tablet (40 mg total) by mouth daily. Stop ranitidine. 06/18/17   [name], MD  polyethylene glycol powder (GLYCOLAX/MIRALAX) powder Mix 17 gm with glass full of water and take 2-3 times a day 06/18/17   [name], MD  sertraline (ZOLOFT) 50 MG tablet TAKE 1 TABLET(50 MG) BY MOUTH DAILY 05/20/17   [name], MD  traMADol (ULTRAM) 50 MG tablet Take 1 tablet (50 mg total) by mouth every 6 (six) hours as needed. 11/04/17   Loura Halt A, NP  Vitamin D, Ergocalciferol, (DRISDOL) 50000 units CAPS capsule Take 1 capsule (50,000 Units total) by mouth every 7 (seven) days. 04/03/17   [name], MD    Family History Family History  Problem Relation Age of Onset  . COPD Maternal Grandmother        Emphysema  . Cancer Maternal Grandmother   . Cancer Maternal Aunt   . Cancer Paternal Grandmother   . Hypertension Mother   . Kidney disease Mother   . Hypertension Father   . Alcohol abuse Father   . Alcohol abuse Sister   . Alcohol abuse Brother   . Diabetes Maternal Uncle     Social History Social History   Tobacco Use  . Smoking status: Former Smoker    Last attempt to quit: 03/06/2003    Years since quitting: 15.1  . Smokeless tobacco: Never Used  Substance Use Topics  . Alcohol use: No  . Drug use: No     Allergies   Shellfish-derived products   Review of Systems Review of Systems Ten systems reviewed and are negative for acute change, except as noted in the HPI.    Physical Exam Updated Vital Signs BP 112/69 (BP Location: Left Arm)   Pulse 76   Temp 97.8 F (36.6 C) (Oral)   Resp 18   SpO2 97%   Physical Exam Vitals signs and nursing note reviewed.  Constitutional:      General:  She is not in acute distress.    Appearance: She is well-developed. She is not diaphoretic.     Comments: Nontoxic-appearing, mildly agitated  HENT:     Head: Normocephalic and atraumatic.     Mouth/Throat:     Mouth: Mucous membranes are moist.     Comments: Tolerating secretions without difficulty.  Normal phonation.  No angioedema. Eyes:  General: No scleral icterus.    Conjunctiva/sclera: Conjunctivae normal.  Neck:     Musculoskeletal: Normal range of motion.  Cardiovascular:     Rate and Rhythm: Normal rate and regular rhythm.     Pulses: Normal pulses.  Pulmonary:     Effort: Pulmonary effort is normal. No respiratory distress.     Breath sounds: No stridor. No wheezing, rhonchi or rales.     Comments: Lungs clear to auscultation bilaterally.  No stridor. Musculoskeletal: Normal range of motion.  Skin:    General: Skin is warm and dry.     Coloration: Skin is not pale.     Findings: No erythema.     Comments: No significant rash noted, though patient is persistently itching extremities.  Neurological:     Mental Status: She is alert and oriented to person, place, and time.  Psychiatric:        Behavior: Behavior normal.      ED Treatments / Results  Labs (all labs ordered are listed, but only abnormal results are displayed) Labs Reviewed - No data to display  EKG None  Radiology No results found.  Procedures Procedures (including critical care time)  Medications Ordered in ED Medications  diphenhydrAMINE (BENADRYL) injection 25 mg (25 mg Intravenous Given 05/02/18 0106)  famotidine (PEPCID) IVPB 20 mg premix (0 mg Intravenous Stopped 05/02/18 0140)  methylPREDNISolone sodium succinate (SOLU-MEDROL) 125 mg/2 mL injection 125 mg (125 mg Intravenous Given 05/02/18 0104)     Initial Impression / Assessment and Plan / ED Course  I have reviewed the triage vital signs and the nursing notes.  Pertinent labs & imaging results that were available during my  care of the patient were reviewed by me and considered in my medical decision making (see chart for details).        43 year old female presents for allergic reaction after eating shellfish.  Reports having crab legs at 1800.  Took Benadryl shortly after ingestion.  Presents complaining of persistent itching.  She was treated with IV Benadryl, Pepcid, Solu-Medrol.  The patient has remained stable since arrival without hypoxia.  No tachycardia or hypotension.  No angioedema on initial exam.  She has not been noted to have any clinical decompensation after prolonged observation.  We will continue with outpatient symptom control with Benadryl as needed and Zantac twice a day.  Return precautions provided at discharge.  Patient discharged in stable condition with no unaddressed concerns.   Final Clinical Impressions(s) / ED Diagnoses   Final diagnoses:  Allergic reaction, initial encounter    ED Discharge Orders    None       Antonietta Breach, PA-C 05/02/18 0310    Orpah Greek, MD 05/02/18 0400

## 2018-05-02 NOTE — ED Notes (Signed)
Pt provided with discharge instructions; opportunity for questions provided. E-signature pad not available for signature.

## 2018-05-02 NOTE — ED Notes (Signed)
Pt reports feeling better states she feels okay to go home

## 2018-05-02 NOTE — Discharge Instructions (Addendum)
Continue 50mg  Benadryl every 8 hours for persistent symptoms/hives. Take this with 150mg  Zantac twice a day. Avoid shellfish and crab. Follow up with your primary care doctor as needed.

## 2018-07-04 ENCOUNTER — Encounter (HOSPITAL_COMMUNITY): Payer: Self-pay | Admitting: Emergency Medicine

## 2018-07-04 ENCOUNTER — Other Ambulatory Visit: Payer: Self-pay

## 2018-07-04 ENCOUNTER — Ambulatory Visit (INDEPENDENT_AMBULATORY_CARE_PROVIDER_SITE_OTHER): Payer: Self-pay

## 2018-07-04 ENCOUNTER — Ambulatory Visit (HOSPITAL_COMMUNITY)
Admission: EM | Admit: 2018-07-04 | Discharge: 2018-07-04 | Disposition: A | Discharge status: Home / Self Care | Payer: Medicaid Other | Attending: Family Medicine | Admitting: Family Medicine

## 2018-07-04 ENCOUNTER — Emergency Department (HOSPITAL_COMMUNITY)
Admission: EM | Admit: 2018-07-04 | Discharge: 2018-07-04 | Discharge status: Against Medical Advice | Payer: Medicaid Other | Attending: Emergency Medicine | Admitting: Emergency Medicine

## 2018-07-04 ENCOUNTER — Encounter (HOSPITAL_COMMUNITY): Payer: Self-pay

## 2018-07-04 DIAGNOSIS — R1032 Left lower quadrant pain: Secondary | ICD-10-CM

## 2018-07-04 DIAGNOSIS — Z5321 Procedure and treatment not carried out due to patient leaving prior to being seen by health care provider: Secondary | ICD-10-CM | POA: Insufficient documentation

## 2018-07-04 DIAGNOSIS — R000 Tachycardia, unspecified: Secondary | ICD-10-CM

## 2018-07-04 DIAGNOSIS — R509 Fever, unspecified: Secondary | ICD-10-CM

## 2018-07-04 LAB — COMPREHENSIVE METABOLIC PANEL
ALT: 18 U/L (ref 0–44)
AST: 15 U/L (ref 15–41)
Albumin: 3.6 g/dL (ref 3.5–5.0)
Alkaline Phosphatase: 78 U/L (ref 38–126)
Anion gap: 10 (ref 5–15)
BUN: 8 mg/dL (ref 6–20)
CO2: 25 mmol/L (ref 22–32)
Calcium: 8.9 mg/dL (ref 8.9–10.3)
Chloride: 104 mmol/L (ref 98–111)
Creatinine, Ser: 1.11 mg/dL — ABNORMAL HIGH (ref 0.44–1.00)
GFR calc Af Amer: >60 mL/min (ref >60)
GFR calc non Af Amer: >60 mL/min (ref >60)
Glucose, Bld: 104 mg/dL — ABNORMAL HIGH (ref 70–99)
Potassium: 3.5 mmol/L (ref 3.5–5.1)
Sodium: 139 mmol/L (ref 135–145)
Total Bilirubin: 0.5 mg/dL (ref 0.3–1.2)
Total Protein: 6.5 g/dL (ref 6.5–8.1)

## 2018-07-04 LAB — CBC
HCT: 39.4 % (ref 36.0–46.0)
Hemoglobin: 13.6 g/dL (ref 12.0–15.0)
MCH: 31.9 pg (ref 26.0–34.0)
MCHC: 34.5 g/dL (ref 30.0–36.0)
MCV: 92.3 fL (ref 80.0–100.0)
Platelets: 410 10*3/uL — ABNORMAL HIGH (ref 150–400)
RBC: 4.27 MIL/uL (ref 3.87–5.11)
RDW: 12.9 % (ref 11.5–15.5)
WBC: 7.3 10*3/uL (ref 4.0–10.5)
nRBC: 0 % (ref 0.0–0.2)

## 2018-07-04 LAB — I-STAT BETA HCG BLOOD, ED (MC, WL, AP ONLY): I-stat hCG, quantitative: <5 m[IU]/mL (ref <5)

## 2018-07-04 LAB — LIPASE, BLOOD: Lipase: 28 U/L (ref 11–51)

## 2018-07-04 MED ORDER — SODIUM CHLORIDE 0.9% FLUSH: 3.0000 mL | Freq: Once | INTRAVENOUS | Status: DC

## 2018-07-04 NOTE — ED Notes (Addendum)
Pt arrived 20 min ago to ED.  She came to nurse first after being triaged and wanted to know how many people are in front of her.  Explained there are currently 8 other people waiting to be seen and the longest wait.  Explained triage process and that her care began at triage and the MD will have the lab results once she gets moved to a room.  Pt states she is not waiting and questioned why the wait was not told to her before checking in.

## 2018-07-04 NOTE — ED Triage Notes (Addendum)
No bm in 2 days, woke with stomach pain, generalized abdominal pain. Describes pain as sharp.    Patient drank warm prune juice, fleets enema and took stool softener and has had minimal results ?Marland Kitchen  Patient describes results as clear with stool particles

## 2018-07-04 NOTE — Discharge Instructions (Signed)
Please follow up in emergency room for further evaluation

## 2018-07-04 NOTE — ED Triage Notes (Signed)
Pt arrives from urgent care for r/o diverticulitis/appy. Pt reports onset of abd pain at 0400 localized to LLQ. Tried laxative at home w/ no relief. Denies N/V/D. Afebrile on arrival

## 2018-07-04 NOTE — ED Provider Notes (Signed)
[place]    CSN: [id number] Arrival date & time: 07/04/18  1909     History   Chief Complaint Chief Complaint  Patient presents with  . Abdominal Pain    HPI Christina Allen is a 43 y.o. female history of GERD, IBS, tobacco use, presenting today for evaluation of abdominal pain.  Patient states that since this morning she has had sharp abdominal pain in her lower abdomen.  Pain will occasionally increase in intensity and will last for a few minutes.  This pain has been debilitating her and causing her to cry.  She initially attributed this to constipation as she has regular issues with bowel movements and frequently straining.  She attempted a Fleet enema, prune juice, and 1 Colace without passing of bowel movements.  Her last full bowel movement was 2 days ago.  She notes that when the pain worsens it is in her left lower quadrant.  Denies history of diverticulosis/-itis.  Denies associated URI symptoms, cough or congestion.  Has had previous C-section and tubal ligation.  Denies urinary symptoms of dysuria, increased frequency or hematuria.  Denies abnormal vaginal discharge or irregular bleeding.  Has attempted to eat [place] toast sticks as well as soup today.  Denies nausea or vomiting.  Denies chest pain or shortness of breath.  Is on Protonix daily for GERD.  HPI  Past Medical History:  Diagnosis Date  . Abnormal Pap smear 2004  . Allergy   . Anal fissure   . Bacterial infection   . Dysmenorrhea   . Endometriosis   . GERD (gastroesophageal reflux disease)   . H/O varicella   . Leukorrhea 04/08/2007   Physiologic  . Menorrhagia   . Pemphigus vulgaris   . Preterm labor   . Trichomonas   . Vaginitis   . Yeast infection     Patient Active Problem List   Diagnosis Date Noted  . Neck and shoulder pain 10/15/2017  . Pinguecula of left eye 07/17/2017  . Right shoulder tendinitis 06/24/2017  . Non-intractable vomiting with nausea 06/18/2017  . Dyspepsia 06/18/2017   . Constipation 06/18/2017  . Insomnia 05/20/2017  . Mood disorder ([place]) 04/15/2017  . Rotator cuff syndrome of left shoulder 03/26/2017  . Candidiasis, intertrigo 03/26/2017  . Screening for HIV (human immunodeficiency virus) 03/21/2017  . Stress incontinence in female 11/17/2015  . Macromastia 06/03/2015  . Obesity 04/30/2014  . Allergic rhinitis 01/25/2012  . GERD (gastroesophageal reflux disease) 01/25/2012  . Irritable bowel syndrome 01/25/2012  . Pemphigus vulgaris 01/25/2012  . History of tobacco abuse 01/25/2012  . Annual physical exam 01/25/2012  . History of B12 deficiency 07/25/2011  . Vitamin D deficiency 05/29/2010    Past Surgical History:  Procedure Laterality Date  . CESAREAN SECTION    . DILATION AND CURETTAGE OF UTERUS    . OTHER SURGICAL HISTORY     tubal ligation and c-section  . TUBAL LIGATION    . WISDOM TOOTH EXTRACTION      OB History    Gravida  3   Para  3   Term      Preterm      AB      Living  3     SAB      TAB      Ectopic      Multiple      Live Births  3            Home Medications    Prior  to Admission medications   Medication Sig Start Date End Date Taking? Authorizing Provider  calcium-vitamin D (OSCAL WITH D) 500-200 MG-UNIT tablet Take 1 tablet by mouth daily with breakfast.   Yes [provider]  pantoprazole (PROTONIX) 40 MG tablet Take 1 tablet (40 mg total) by mouth daily. Stop ranitidine. 06/18/17  Yes [name], MD  polyethylene glycol powder (GLYCOLAX/MIRALAX) powder Mix 17 gm with glass full of water and take 2-3 times a day 06/18/17  Yes Gonfa, Charlesetta Ivory, MD  Vitamin D, Ergocalciferol, (DRISDOL) 50000 units CAPS capsule Take 1 capsule (50,000 Units total) by mouth every 7 (seven) days. 04/03/17  Yes [name], MD  albuterol (PROVENTIL HFA;VENTOLIN HFA) 108 (90 Base) MCG/ACT inhaler Inhale 2 puffs into the lungs every 6 (six) hours as needed for wheezing or shortness of breath. 11/17/15   [name], MD  Capsaicin-Menthol-Methyl Sal (CAPSAICIN-METHYL SAL-MENTHOL) 0.025-1-12 % CREA Apply 1 application topically 4 (four) times daily. 11/01/17   Lind Covert, MD  cetirizine-pseudoephedrine (ZYRTEC-D) 5-120 MG tablet Take 1 tablet by mouth daily. 02/05/18   Wurst, [place], PA-C  cyclobenzaprine (FLEXERIL) 10 MG tablet Take 1 tablet (10 mg total) by mouth at bedtime. 11/01/17   Lind Covert, MD  diclofenac sodium (VOLTAREN) 1 % GEL Apply 4 g topically 4 (four) times daily. 11/01/17   Lind Covert, MD  fluticasone (FLONASE) 50 MCG/ACT nasal spray Place 2 sprays into both nostrils daily. 03/26/17   [name], MD  loratadine (CLARITIN) 10 MG tablet Take 1 tablet (10 mg total) by mouth daily. 04/30/18   Kathrene Alu, MD  montelukast (SINGULAIR) 10 MG tablet Take 10 mg by mouth at bedtime.    [provider]  naproxen (NAPROSYN) 500 MG tablet Take 1 tablet (500 mg total) by mouth 2 (two) times daily with a meal. 11/01/17   Chambliss, Jeb Levering, MD  nystatin (MYCOSTATIN/NYSTOP) powder Apply topically 4 (four) times daily. 02/05/18   Kathrene Alu, MD  sertraline (ZOLOFT) 50 MG tablet TAKE 1 TABLET(50 MG) BY MOUTH DAILY 05/20/17   [name], MD  traMADol (ULTRAM) 50 MG tablet Take 1 tablet (50 mg total) by mouth every 6 (six) hours as needed. 11/04/17   Orvan July, NP    Family History Family History  Problem Relation Age of Onset  . COPD Maternal Grandmother        Emphysema  . Cancer Maternal Grandmother   . Cancer Maternal Aunt   . Cancer Paternal Grandmother   . Hypertension Mother   . Kidney disease Mother   . Hypertension Father   . Alcohol abuse Father   . Alcohol abuse Sister   . Alcohol abuse Brother   . Diabetes Maternal Uncle     Social History Social History   Tobacco Use  . Smoking status: Former Smoker    Last attempt to quit: 03/06/2003    Years since quitting: 15.3  . Smokeless tobacco: Never Used  Substance Use  Topics  . Alcohol use: No  . Drug use: No     Allergies   Shellfish-derived products   Review of Systems Review of Systems  Constitutional: Negative for fever.  Respiratory: Negative for shortness of breath.   Cardiovascular: Negative for chest pain.  Gastrointestinal: Positive for abdominal pain and diarrhea. Negative for nausea and vomiting.  Genitourinary: Negative for dysuria, flank pain, genital sores, hematuria, menstrual problem, vaginal bleeding, vaginal discharge and vaginal pain.  Musculoskeletal: Negative  for back pain.  Skin: Negative for rash.  Neurological: Negative for dizziness, light-headedness and headaches.     Physical Exam Triage Vital Signs ED Triage Vitals  Enc Vitals Group     BP 07/04/18 1928 109/77     Pulse Rate 07/04/18 1928 (!) 108     Resp 07/04/18 1928 20     Temp 07/04/18 1928 99.5 F (37.5 C)     Temp Source 07/04/18 1928 Oral     SpO2 07/04/18 1928 97 %     Weight --      Height --      Head Circumference --      Peak Flow --      Pain Score 07/04/18 1924 10     Pain Loc --      Pain Edu? --      Excl. in [place]? --    No data found.  Updated Vital Signs BP 109/77 (BP Location: Right Arm)   Pulse (!) 108   Temp 99.5 F (37.5 C) (Oral)   Resp 20   SpO2 97%  Heart rate rechecked and was 104 Visual Acuity Right Eye Distance:   Left Eye Distance:   Bilateral Distance:    Right Eye Near:   Left Eye Near:    Bilateral Near:     Physical Exam Vitals signs and nursing note reviewed.  Constitutional:      General: She is not in acute distress.    Appearance: She is well-developed.  HENT:     Head: Normocephalic and atraumatic.     Mouth/Throat:     Comments: Oral mucosa pink and moist, no tonsillar enlargement or exudate. Posterior pharynx patent and nonerythematous, no uvula deviation or swelling. Normal phonation. Eyes:     Conjunctiva/sclera: Conjunctivae normal.  Neck:     Musculoskeletal: Neck supple.   Cardiovascular:     Rate and Rhythm: Regular rhythm. Tachycardia present.     Heart sounds: No murmur.  Pulmonary:     Effort: Pulmonary effort is normal. No respiratory distress.     Breath sounds: Normal breath sounds.     Comments: Breathing comfortably at rest, CTABL, no wheezing, rales or other adventitious sounds auscultated Abdominal:     Palpations: Abdomen is soft.     Tenderness: There is abdominal tenderness.     Comments: Abdomen soft, slightly protuberant, generalized tenderness throughout abdomen, increase in tenderness to bilateral lower quadrants, negative Rovsing, negative McBurney's, negative Murphy's.  Negative rebound  When patient began to ambulate, pain increased and caused her to double over  Skin:    General: Skin is warm and dry.  Neurological:     Mental Status: She is alert.      UC Treatments / Results  Labs (all labs ordered are listed, but only abnormal results are displayed) Labs Reviewed - No data to display  EKG None  Radiology Dg Abd Acute W/chest  Result Date: 07/04/2018 CLINICAL DATA:  Hervey Ard lower abdominal pain for 1 day, failure of constipation medications, low-grade fever EXAM: DG ABDOMEN ACUTE W/ 1V CHEST COMPARISON:  Chest radiograph 12/22/2015, CT abdomen pelvis 07/27/2014 FINDINGS: Normal heart size, mediastinal contours, and pulmonary vascularity. Lungs clear. No infiltrate, pleural effusion or pneumothorax. Bowel gas pattern normal. No bowel dilatation, bowel wall thickening, or free air. Small pelvic phleboliths. No definite urinary tract calcifications. IMPRESSION: No acute abnormalities. Electronically Signed   By: Lavonia Vicky M.D.   On: 07/04/2018 20:28    Procedures Procedures (including critical care  time)  Medications Ordered in UC Medications - No data to display  Initial Impression / Assessment and Plan / UC Course  I have reviewed the triage vital signs and the nursing notes.  Pertinent labs & imaging results that were  available during my care of the patient were reviewed by me and considered in my medical decision making (see chart for details).     Patient with low-grade fever, tachycardia and sharp abdominal pain x1 day.  Acute abdomen with chest obtained which does show some stool in the rectum and splenic flexure as well as right colon, but would not not expect constipation to cause low-grade fever and as significant pain that she is having. Given pain doubling over with low-grade fever and localized to left lower quadrant recommending further evaluation in emergency room to rule out abdominal emergency.  Possible diverticulitis.  Patient verbalized understanding and was agreeable.  Stable on discharge, transported via wheelchair with RN.   Final Clinical Impressions(s) / UC Diagnoses   Final diagnoses:  Left lower quadrant abdominal pain     Discharge Instructions     Please follow up in emergency room for further evaluation   ED Prescriptions    None     Controlled Substance Prescriptions  Controlled Substance Registry consulted? Not Applicable   Janith Lima, [place] 07/04/18 2040

## 2018-08-25 ENCOUNTER — Other Ambulatory Visit: Payer: Self-pay

## 2018-08-25 ENCOUNTER — Ambulatory Visit (HOSPITAL_COMMUNITY)
Admission: EM | Admit: 2018-08-25 | Discharge: 2018-08-25 | Disposition: A | Discharge status: Home / Self Care | Payer: Self-pay | Attending: Emergency Medicine | Admitting: Emergency Medicine

## 2018-08-25 ENCOUNTER — Encounter (HOSPITAL_COMMUNITY): Payer: Self-pay | Admitting: *Deleted

## 2018-08-25 ENCOUNTER — Ambulatory Visit (INDEPENDENT_AMBULATORY_CARE_PROVIDER_SITE_OTHER): Payer: Self-pay

## 2018-08-25 DIAGNOSIS — M5442 Lumbago with sciatica, left side: Secondary | ICD-10-CM

## 2018-08-25 MED ORDER — HYDROCODONE-ACETAMINOPHEN 5-325 MG PO TABS: 1.0000 | ORAL_TABLET | Freq: Four times a day (QID) | ORAL | 0 refills | Status: DC | PRN

## 2018-08-25 MED ORDER — KETOROLAC TROMETHAMINE 30 MG/ML IJ SOLN
INTRAMUSCULAR | Status: AC
  Filled 2018-08-25: qty 1

## 2018-08-25 MED ORDER — KETOROLAC TROMETHAMINE 30 MG/ML IJ SOLN
30.0000 mg | Freq: Once | INTRAMUSCULAR | Status: AC
  Administered 2018-08-25: 30 mg via INTRAMUSCULAR

## 2018-08-25 MED ORDER — ACETAMINOPHEN 325 MG PO TABS
975.0000 mg | ORAL_TABLET | Freq: Once | ORAL | Status: AC
  Administered 2018-08-25: 19:00:00 975 mg via ORAL

## 2018-08-25 MED ORDER — TIZANIDINE HCL 4 MG PO TABS: 4.0000 mg | ORAL_TABLET | Freq: Three times a day (TID) | ORAL | 0 refills | Status: DC | PRN

## 2018-08-25 MED ORDER — METHYLPREDNISOLONE 4 MG PO TBPK: ORAL_TABLET | Freq: Every day | ORAL | 0 refills | Status: DC

## 2018-08-25 MED ORDER — ACETAMINOPHEN 325 MG PO TABS
ORAL_TABLET | ORAL | Status: AC
  Filled 2018-08-25: qty 3

## 2018-08-25 MED ORDER — IBUPROFEN 600 MG PO TABS: 600.0000 mg | ORAL_TABLET | Freq: Four times a day (QID) | ORAL | 0 refills | Status: DC | PRN

## 2018-08-25 NOTE — Discharge Instructions (Addendum)
Your x-rays were normal.   600 mg of ibuprofen combined with a Tylenol containing product 3 or 4 times a day.  Do this on a regular basis for the next several days.   Tylenol/ibuprofen mild to moderate pain or 1-2 Norco/ibuprofen for severe pain.  Flexeril help with muscle spasms.  Medrol Dosepak will help with the nerve pain going down the back of your leg.  Many people find gentle stretching and deep tissue massage helpful.  Follow-up with your primary care physician in several days, go to the ER for the signs and symptoms we discussed.  Go to www.goodrx.com to look up your medications. This will give you a list of where you can find your prescriptions at the most affordable prices. Or ask the pharmacist what the cash price is, or if they have any other discount programs available to help make your medication more affordable. This can be less expensive than what you would pay with insurance.

## 2018-08-25 NOTE — ED Provider Notes (Signed)
HPI  SUBJECTIVE:  Christina Allen is a 42 y.o. female who presents with intermittent left lower back pain for the past week.  She describes the back pain as dull, achy, throbbing with constant sharp pain radiating down the posterior left leg.  She states that her leg "feels restless".  She tried a heating pad, Epsom salt, ibuprofen 400 mg every 4-6 hours.  She tried 20 mg of prednisone for 5 days.  The heating pad helps.  Sitting for prolonged periods of time, torso rotation and bending forward make her leg back pain worse.  She reports occasional left leg weakness secondary to the pain.  States that the pain is worse at night.  Denies recent heavy lifting, trauma to the back.  No N/V, fevers, flank pain, abdominal pain, pelvic pain.  No urinary urgency, frequency, dysuria, cloudy or odorous urine, hematuria.  No saddle anesthesia, distal weakness/numbness, bilateral radicular leg pain/weakness,  bladder/ bowel incontinence, urinary retention, h/o CA / multiple myleoma.  She has a h/o prolonged steroid use due to pemphigus.  No h/o osteopenia, h/o IVDU, h/o HIV.  No history of diabetes, hypertension.  LMP: Has a Mirena.  Denies the possibility of being pregnant.  PMD: [facility].  No NSAID or Tylenol in the past 4 to 6 hours.  Past Medical History:  Diagnosis Date  . Abnormal Pap smear 2004  . Allergy   . Anal fissure   . Bacterial infection   . Dysmenorrhea   . Endometriosis   . GERD (gastroesophageal reflux disease)   . H/O varicella   . Leukorrhea 04/08/2007   Physiologic  . Menorrhagia   . Pemphigus vulgaris   . Preterm labor   . Trichomonas   . Vaginitis   . Yeast infection     Past Surgical History:  Procedure Laterality Date  . CESAREAN SECTION    . DILATION AND CURETTAGE OF UTERUS    . OTHER SURGICAL HISTORY     tubal ligation and c-section  . TUBAL LIGATION    . WISDOM TOOTH EXTRACTION      Family History  Problem Relation Age of Onset  . COPD Maternal Grandmother         Emphysema  . Cancer Maternal Grandmother   . Cancer Maternal Aunt   . Cancer Paternal Grandmother   . Hypertension Mother   . Kidney disease Mother   . Hypertension Father   . Alcohol abuse Father   . Deep vein thrombosis Father   . Alcohol abuse Sister   . Alcohol abuse Brother   . Diabetes Maternal Uncle     Social History   Tobacco Use  . Smoking status: Former Smoker    Quit date: 03/06/2003    Years since quitting: 15.4  . Smokeless tobacco: Never Used  Substance Use Topics  . Alcohol use: No  . Drug use: No    No current facility-administered medications for this encounter.   Current Outpatient Medications:  .  calcium-vitamin D (OSCAL WITH D) 500-200 MG-UNIT tablet, Take 1 tablet by mouth daily with breakfast., Disp: , Rfl:  .  fluticasone (FLONASE) 50 MCG/ACT nasal spray, Place 2 sprays into both nostrils daily., Disp: 16 g, Rfl: 6 .  loratadine (CLARITIN) 10 MG tablet, Take 1 tablet (10 mg total) by mouth daily., Disp: 30 tablet, Rfl: 11 .  montelukast (SINGULAIR) 10 MG tablet, Take 10 mg by mouth at bedtime., Disp: , Rfl:  .  pantoprazole (PROTONIX) 40 MG tablet, Take 1  tablet (40 mg total) by mouth daily. Stop ranitidine., Disp: 90 tablet, Rfl: 0 .  albuterol (PROVENTIL HFA;VENTOLIN HFA) 108 (90 Base) MCG/ACT inhaler, Inhale 2 puffs into the lungs every 6 (six) hours as needed for wheezing or shortness of breath., Disp: 1 Inhaler, Rfl: 0 .  Capsaicin-Menthol-Methyl Sal (CAPSAICIN-METHYL SAL-MENTHOL) 0.025-1-12 % CREA, Apply 1 application topically 4 (four) times daily., Disp: 1 Tube, Rfl: 1 .  cetirizine-pseudoephedrine (ZYRTEC-D) 5-120 MG tablet, Take 1 tablet by mouth daily., Disp: 30 tablet, Rfl: 0 .  HYDROcodone-acetaminophen (NORCO/VICODIN) 5-325 MG tablet, Take 1-2 tablets by mouth every 6 (six) hours as needed for moderate pain or severe pain., Disp: 12 tablet, Rfl: 0 .  ibuprofen (ADVIL) 600 MG tablet, Take 1 tablet (600 mg total) by mouth every 6 (six)  hours as needed., Disp: 30 tablet, Rfl: 0 .  methylPREDNISolone (MEDROL DOSEPAK) 4 MG TBPK tablet, Take by mouth daily. Follow package instructions, Disp: 21 tablet, Rfl: 0 .  nystatin (MYCOSTATIN/NYSTOP) powder, Apply topically 4 (four) times daily., Disp: 45 g, Rfl: 1 .  polyethylene glycol powder (GLYCOLAX/MIRALAX) powder, Mix 17 gm with glass full of water and take 2-3 times a day, Disp: 525 g, Rfl: 1 .  sertraline (ZOLOFT) 50 MG tablet, TAKE 1 TABLET(50 MG) BY MOUTH DAILY, Disp: 90 tablet, Rfl: 0 .  tiZANidine (ZANAFLEX) 4 MG tablet, Take 1 tablet (4 mg total) by mouth every 8 (eight) hours as needed for muscle spasms., Disp: 30 tablet, Rfl: 0 .  Vitamin D, Ergocalciferol, (DRISDOL) 50000 units CAPS capsule, Take 1 capsule (50,000 Units total) by mouth every 7 (seven) days., Disp: 8 capsule, Rfl: 0  Allergies  Allergen Reactions  . Shellfish-Derived Products Swelling    Mouth swelling Mouth swelling     ROS  As noted in HPI.   Physical Exam  BP 111/71   Pulse (!) 106   Temp 98.3 F (36.8 C) (Oral)   Resp 18   SpO2 100%   Constitutional: Well developed, well nourished, appears uncomfortable Eyes:  EOMI, conjunctiva normal bilaterally HENT: Normocephalic, atraumatic,mucus membranes moist Respiratory: Normal inspiratory effort Cardiovascular: Normal rate GI: nondistended. No suprapubic tenderness skin: No rash, skin intact Musculoskeletal: no CVAT. + L paralumbar tenderness at the QL, + muscle spasm. + bony tenderness at L4, L5, S1. Bilateral lower extremities nontender, baseline ROM with intact DP pulses. No pain with int/ext rotation extension hips bilaterally. pain Aggravated with left hip flexion against resistance.  SLR positive left side.  Sensation baseline light touch bilaterally for Pt, DTR's symmetric and intact bilaterally KJ, Motor symmetric bilateral 5/5 hip flexion, quadriceps, hamstrings, EHL, foot dorsiflexion, foot plantarflexion, gait somewhat antalgic but  without apparent new ataxia. Neurologic: Alert & oriented x 3, no focal neuro deficits Psychiatric: Speech and behavior appropriate   ED Course   Medications  acetaminophen (TYLENOL) tablet 975 mg (975 mg Oral Given 08/25/18 1859)  ketorolac (TORADOL) 30 MG/ML injection 30 mg (30 mg Intramuscular Given 08/25/18 1859)  ketorolac (TORADOL) 30 MG/ML injection (has no administration in time range)  acetaminophen (TYLENOL) 325 MG tablet (has no administration in time range)    Orders Placed This Encounter  Procedures  . DG Lumbar Spine Complete    Standing Status:   Standing    Number of Occurrences:   1    Order Specific Question:   Reason for Exam (SYMPTOM  OR DIAGNOSIS REQUIRED)    Answer:   L LBP with sciatica    Order Specific Question:   Is  patient pregnant?    Answer:   No    No results found for this or any previous visit (from the past 24 hour(s)). Dg Lumbar Spine Complete  Result Date: 08/25/2018 CLINICAL DATA:  Left low back pain with sciatica. Pain for 1 week. EXAM: LUMBAR SPINE - COMPLETE 4+ VIEW COMPARISON:  CT 07/27/2014 FINDINGS: The alignment is maintained. Vertebral body heights are normal. There is no listhesis. The posterior elements are intact. Disc spaces are preserved. No fracture. Sacroiliac joints are symmetric and normal. Calcifications projecting over the right sacrum or phleboliths in the ovarian vein. IMPRESSION: Unremarkable radiographs of the lumbar spine. Electronically Signed   By: Keith Rake M.D.   On: 08/25/2018 19:50    ED Clinical Impression  1. Acute left-sided low back pain with left-sided sciatica      ED Assessment/Plan  [place] Narcotic database reviewed for this patient, and feel that the risk/benefit ratio today is favorable for proceeding with a prescription for controlled substance. Pt with no opiate rx since his 2019.  Patient has a history of prolonged steroid use and bony tenderness.  Will get L-spine XR.  Giving Toradol 30 mg IM  and Tylenol 975 mg p.o.  If imaging is negative, presentation is most consistent with low back pain with sciatica left side.  No evidence of cauda equina syndrome.  Will send home with ibuprofen 600 mg and a Tylenol containing product 3 or 4 times a day.  Ibuprofen/1 g of Tylenol for mild to moderate pain, ibuprofen/1-2 Norco for severe pain.  Discussed patient did not take both Tylenol and Norco. Medrol Dosepak, Zanaflex.  she will follow-up with her PMD as needed.  Reviewed imaging independently.  Normal L-spine.  See radiology report for full details.  Discussed medical decision-making, and plan for follow-up with the patient.  Discussed signs and symptoms that should prompt return to the emergency department.  Patient agrees with plan.   Meds ordered this encounter  Medications  . acetaminophen (TYLENOL) tablet 975 mg  . ketorolac (TORADOL) 30 MG/ML injection 30 mg  . ibuprofen (ADVIL) 600 MG tablet    Sig: Take 1 tablet (600 mg total) by mouth every 6 (six) hours as needed.    Dispense:  30 tablet    Refill:  0  . tiZANidine (ZANAFLEX) 4 MG tablet    Sig: Take 1 tablet (4 mg total) by mouth every 8 (eight) hours as needed for muscle spasms.    Dispense:  30 tablet    Refill:  0  . methylPREDNISolone (MEDROL DOSEPAK) 4 MG TBPK tablet    Sig: Take by mouth daily. Follow package instructions    Dispense:  21 tablet    Refill:  0  . HYDROcodone-acetaminophen (NORCO/VICODIN) 5-325 MG tablet    Sig: Take 1-2 tablets by mouth every 6 (six) hours as needed for moderate pain or severe pain.    Dispense:  12 tablet    Refill:  0    *This clinic note was created using [employer]. Therefore, there may be occasional mistakes despite careful proofreading.  ?    Melynda Ripple, MD 08/25/18 2000

## 2018-08-25 NOTE — ED Triage Notes (Signed)
C/O left low back pain radiating down into left buttock and down entire LLE.  Denies parasthesias, but c/o some weakness.  Denies any injury.

## 2018-09-01 ENCOUNTER — Other Ambulatory Visit: Payer: Self-pay

## 2018-09-01 ENCOUNTER — Telehealth (INDEPENDENT_AMBULATORY_CARE_PROVIDER_SITE_OTHER): Payer: Self-pay | Admitting: Family Medicine

## 2018-09-01 DIAGNOSIS — Z20822 Contact with and (suspected) exposure to covid-19: Secondary | ICD-10-CM

## 2018-09-01 DIAGNOSIS — R6889 Other general symptoms and signs: Secondary | ICD-10-CM

## 2018-09-01 NOTE — Progress Notes (Signed)
  [facility] Tele[facility]icine Visit  Patient consented to have virtual visit. Method of visit: Video was attempted, but technology challenges prevented patient from using video, so visit was conducted via telephone.  Encounter participants: Patient: Christina Allen - located at [employer] house Provider: Steve Rattler - located at [facility] Others (if applicable): mother  Chief Complaint: cough  HPI:  Patient started feeling badly 3-4 days ago started feeling badly with chills, sinus pressure and congestion, headache, body aches, cough. She is fatigued, no energy. Cough is dry.   Not able to eat or drink much as her stomach is upset. No vomiting. Has looser stools. Reports she got some alka-seltzer cold and flu for congestion with relief. No SOB no chest pain. Mother also sick.  Sick contact in her nephew who was coughing last week.   ROS: per HPI  Pertinent PMHx: hx tobacco use   Exam:  Respiratory: speaks in full sentences   Assessment/Plan:  1. Suspected 2019 Novel Coronavirus Infection Message sent to [facility] to get testing done Advised staying hydrated, take tylenol as needed  Reasons to go to ED reviewed including worsening instead of better, SOB, inability to take PO. Patient verbalized understanding and agreement with plan. Advised quarantining until testing returns.   Time spent during visit with patient: 6 minutes

## 2018-09-02 ENCOUNTER — Other Ambulatory Visit: Payer: Self-pay

## 2018-09-02 ENCOUNTER — Telehealth: Payer: Self-pay | Admitting: *Deleted

## 2018-09-02 DIAGNOSIS — Z20822 Contact with and (suspected) exposure to covid-19: Secondary | ICD-10-CM

## 2018-09-02 NOTE — Telephone Encounter (Signed)
-----   Message from Steve Rattler, DO sent at 09/01/2018  3:27 PM EDT ----- Regarding: needs covid test  Likely covid infection, please call for testing  Lucila Maine, DO PGY-3, [facility] 09/01/2018 3:28 PM

## 2018-09-02 NOTE — Telephone Encounter (Signed)
Pt scheduled today for covid testing @ 2:45 @ [facility]. Instructions given and order placed.

## 2018-09-08 LAB — NOVEL CORONAVIRUS, NAA: SARS-CoV-2, NAA: DETECTED — AB

## 2018-09-19 ENCOUNTER — Emergency Department (HOSPITAL_COMMUNITY)
Admission: EM | Admit: 2018-09-19 | Discharge: 2018-09-19 | Disposition: A | Discharge status: Home / Self Care | Payer: Medicaid Other | Attending: Emergency Medicine | Admitting: Emergency Medicine

## 2018-09-19 ENCOUNTER — Encounter (HOSPITAL_COMMUNITY): Payer: Self-pay | Admitting: Emergency Medicine

## 2018-09-19 ENCOUNTER — Other Ambulatory Visit: Payer: Self-pay

## 2018-09-19 DIAGNOSIS — M549 Dorsalgia, unspecified: Secondary | ICD-10-CM | POA: Insufficient documentation

## 2018-09-19 DIAGNOSIS — Z5321 Procedure and treatment not carried out due to patient leaving prior to being seen by health care provider: Secondary | ICD-10-CM | POA: Insufficient documentation

## 2018-09-19 NOTE — ED Triage Notes (Signed)
Pt crying, pt here for eval of lower back pain to left lower back that has been causing numbness and tingling down the left leg. She was taking a steroid, hydrocodone and ibuprofen with some relief to pain but pain is getting worse. No loss of bladder or bowel function.

## 2018-09-20 ENCOUNTER — Emergency Department (HOSPITAL_BASED_OUTPATIENT_CLINIC_OR_DEPARTMENT_OTHER)
Admission: EM | Admit: 2018-09-20 | Discharge: 2018-09-20 | Disposition: A | Discharge status: Home / Self Care | Payer: Medicaid Other | Attending: Emergency Medicine | Admitting: Emergency Medicine

## 2018-09-20 ENCOUNTER — Encounter (HOSPITAL_BASED_OUTPATIENT_CLINIC_OR_DEPARTMENT_OTHER): Payer: Self-pay | Admitting: Emergency Medicine

## 2018-09-20 ENCOUNTER — Other Ambulatory Visit: Payer: Self-pay

## 2018-09-20 ENCOUNTER — Emergency Department (HOSPITAL_COMMUNITY): Payer: Medicaid Other

## 2018-09-20 DIAGNOSIS — Z3202 Encounter for pregnancy test, result negative: Secondary | ICD-10-CM | POA: Insufficient documentation

## 2018-09-20 DIAGNOSIS — Z87891 Personal history of nicotine dependence: Secondary | ICD-10-CM | POA: Insufficient documentation

## 2018-09-20 DIAGNOSIS — M5116 Intervertebral disc disorders with radiculopathy, lumbar region: Secondary | ICD-10-CM | POA: Insufficient documentation

## 2018-09-20 LAB — COMPREHENSIVE METABOLIC PANEL
ALT: 23 U/L (ref 0–44)
AST: 20 U/L (ref 15–41)
Albumin: 4 g/dL (ref 3.5–5.0)
Alkaline Phosphatase: 71 U/L (ref 38–126)
Anion gap: 9 (ref 5–15)
BUN: 9 mg/dL (ref 6–20)
CO2: 22 mmol/L (ref 22–32)
Calcium: 8.7 mg/dL — ABNORMAL LOW (ref 8.9–10.3)
Chloride: 107 mmol/L (ref 98–111)
Creatinine, Ser: 0.91 mg/dL (ref 0.44–1.00)
GFR calc Af Amer: >60 mL/min (ref >60)
GFR calc non Af Amer: >60 mL/min (ref >60)
Glucose, Bld: 98 mg/dL (ref 70–99)
Potassium: 3.2 mmol/L — ABNORMAL LOW (ref 3.5–5.1)
Sodium: 138 mmol/L (ref 135–145)
Total Bilirubin: 0.7 mg/dL (ref 0.3–1.2)
Total Protein: 7.4 g/dL (ref 6.5–8.1)

## 2018-09-20 LAB — CBC
HCT: 38 % (ref 36.0–46.0)
Hemoglobin: 13 g/dL (ref 12.0–15.0)
MCH: 31.9 pg (ref 26.0–34.0)
MCHC: 34.2 g/dL (ref 30.0–36.0)
MCV: 93.4 fL (ref 80.0–100.0)
Platelets: 422 10*3/uL — ABNORMAL HIGH (ref 150–400)
RBC: 4.07 MIL/uL (ref 3.87–5.11)
RDW: 12.7 % (ref 11.5–15.5)
WBC: 6.2 10*3/uL (ref 4.0–10.5)
nRBC: 0 % (ref 0.0–0.2)

## 2018-09-20 LAB — POC URINE PREG, ED: Preg Test, Ur: NEGATIVE

## 2018-09-20 MED ORDER — LIDOCAINE 5 % EX PTCH: 1.0000 | MEDICATED_PATCH | CUTANEOUS | Status: DC

## 2018-09-20 MED ORDER — KETOROLAC TROMETHAMINE 30 MG/ML IJ SOLN
30.0000 mg | Freq: Once | INTRAMUSCULAR | Status: AC
  Administered 2018-09-20: 30 mg via INTRAMUSCULAR
  Filled 2018-09-20: qty 1

## 2018-09-20 MED ORDER — PREDNISONE 10 MG (21) PO TBPK: ORAL_TABLET | Freq: Every day | ORAL | 0 refills | Status: DC

## 2018-09-20 MED ORDER — CYCLOBENZAPRINE HCL 10 MG PO TABS: 10.0000 mg | ORAL_TABLET | Freq: Two times a day (BID) | ORAL | 0 refills | Status: DC | PRN

## 2018-09-20 MED ORDER — HYDROCODONE-ACETAMINOPHEN 5-325 MG PO TABS: 1.0000 | ORAL_TABLET | Freq: Four times a day (QID) | ORAL | 0 refills | Status: DC | PRN

## 2018-09-20 MED ORDER — HYDROMORPHONE HCL 1 MG/ML IJ SOLN: 1.0000 mg | Freq: Once | INTRAMUSCULAR | Status: DC

## 2018-09-20 MED ORDER — LIDOCAINE 5 % EX PTCH
1.0000 | MEDICATED_PATCH | CUTANEOUS | Status: DC
  Administered 2018-09-20: 1 via TRANSDERMAL
  Filled 2018-09-20: qty 1

## 2018-09-20 MED ORDER — OXYCODONE HCL 5 MG PO TABS
5.0000 mg | ORAL_TABLET | Freq: Once | ORAL | Status: AC
  Administered 2018-09-20: 5 mg via ORAL
  Filled 2018-09-20: qty 1

## 2018-09-20 MED ORDER — CYCLOBENZAPRINE HCL 10 MG PO TABS
10.0000 mg | ORAL_TABLET | Freq: Once | ORAL | Status: AC
  Administered 2018-09-20: 10 mg via ORAL
  Filled 2018-09-20: qty 1

## 2018-09-20 NOTE — ED Triage Notes (Signed)
L low back pain for a month. C/o numbness to L leg and foot x 2 weeks.

## 2018-09-20 NOTE — ED Notes (Signed)
Patient arrives from [facility] after evaluation there for back pain, she was sent here for further work up and MRI.

## 2018-09-20 NOTE — ED Provider Notes (Signed)
[facility] Emergency Department Provider Note MRN:  [id number]  Arrival date & time: 09/20/18     Chief Complaint   Back Pain   History of Present Illness   Christina Allen is a 43 y.o. year-old female with no pertinent past medical history presenting to the ED with chief complaint of back pain.  Pain in the left lower back is been present for 4 to 5 weeks, was seen in the emergency department and diagnosed with sciatica.  Explains that over the past week her symptoms have changed.  Pain has increased and she has now noticed weakness in the left leg as well as decreased sensation to the left foot.  She denies fever, no IV drug use, no chest pain or shortness of breath, no abdominal pain.  No hematuria or dysuria.  Pain is constant, worse with motion or palpation, no other exacerbating relieving factors.  8 out of 10 in severity.  Review of Systems  A complete 10 system review of systems was obtained and all systems are negative except as noted in the HPI and PMH.   Patient's Health History    Past Medical History:  Diagnosis Date  . Abnormal Pap smear 2004  . Allergy   . Anal fissure   . Bacterial infection   . Dysmenorrhea   . Endometriosis   . GERD (gastroesophageal reflux disease)   . H/O varicella   . Leukorrhea 04/08/2007   Physiologic  . Menorrhagia   . Pemphigus vulgaris   . Preterm labor   . Trichomonas   . Vaginitis   . Yeast infection     Past Surgical History:  Procedure Laterality Date  . CESAREAN SECTION    . DILATION AND CURETTAGE OF UTERUS    . OTHER SURGICAL HISTORY     tubal ligation and c-section  . TUBAL LIGATION    . WISDOM TOOTH EXTRACTION      Family History  Problem Relation Age of Onset  . COPD Maternal Grandmother        Emphysema  . Cancer Maternal Grandmother   . Cancer Maternal Aunt   . Cancer Paternal Grandmother   . Hypertension Mother   . Kidney disease Mother   . Hypertension Father   . Alcohol abuse  Father   . Deep vein thrombosis Father   . Alcohol abuse Sister   . Alcohol abuse Brother   . Diabetes Maternal Uncle     Social History   Socioeconomic History  . Marital status: Single    Spouse name: Not on file  . Number of children: Not on file  . Years of education: Not on file  . Highest education level: Not on file  Occupational History  . Not on file  Social Needs  . Financial resource strain: Not on file  . Food insecurity    Worry: Not on file    Inability: Not on file  . Transportation needs    Medical: Not on file    Non-medical: Not on file  Tobacco Use  . Smoking status: Former Smoker    Quit date: 03/06/2003    Years since quitting: 15.5  . Smokeless tobacco: Never Used  Substance and Sexual Activity  . Alcohol use: No  . Drug use: No  . Sexual activity: Never    Birth control/protection: Implant, Surgical  Lifestyle  . Physical activity    Days per week: Not on file    Minutes per session: Not on file  .  Stress: Not on file  Relationships  . Social Herbalist on phone: Not on file    Gets together: Not on file    Attends religious service: Not on file    Active member of club or organization: Not on file    Attends meetings of clubs or organizations: Not on file    Relationship status: Not on file  . Intimate partner violence    Fear of current or ex partner: Not on file    Emotionally abused: Not on file    Physically abused: Not on file    Forced sexual activity: Not on file  Other Topics Concern  . Not on file  Social History Narrative   Lives with children    Quit tobacco 2005 (after 10 years)   Single     Physical Exam  Vital Signs and Nursing Notes reviewed Vitals:   09/20/18 1203  BP: 119/88  Pulse: (!) 114  Resp: 18  Temp: 98.2 F (36.8 C)  SpO2: 96%    CONSTITUTIONAL: Well-appearing, NAD NEURO:  Alert and oriented x 3, subjective decreased sensation to the left foot EYES:  eyes equal and reactive ENT/NECK:  no  LAD, no JVD CARDIO: Tachycardic rate, well-perfused, normal S1 and S2 PULM:  CTAB no wheezing or rhonchi GI/GU:  normal bowel sounds, non-distended, non-tender MSK/SPINE:  No gross deformities, no edema, positive left straight leg test SKIN:  no rash, atraumatic PSYCH:  Appropriate speech and behavior  Diagnostic and Interventional Summary    Labs Reviewed  CBC  COMPREHENSIVE METABOLIC PANEL    MR LUMBAR SPINE WO CONTRAST    (Results Pending)    Medications  oxyCODONE (Oxy IR/ROXICODONE) immediate release tablet 5 mg (has no administration in time range)     Procedures Critical Care  ED Course and Medical Decision Making  I have reviewed the triage vital signs and the nursing notes.  Pertinent labs & imaging results that were available during my care of the patient were reviewed by me and considered in my medical decision making (see below for details).  Progressive neurological deficit with 4 to 5 weeks of low back pain in this 43 year old female, subjective decrease sensation on exam, MRI warranted to exclude myelopathy.  Will transfer to [facility], accepting ED physician Dr. Melina Copa.  Barth Kirks. Sedonia Small, [place] [email or web address]  Final Clinical Impressions(s) / ED Diagnoses     ICD-10-CM   1. Chronic left-sided low back pain with left-sided sciatica  M54.42    G89.29   2. Numbness  R20.0     ED Discharge Orders    None         Maudie Flakes, MD 09/20/18 1252

## 2018-09-20 NOTE — Discharge Instructions (Addendum)
1. Medications: Start taking steroid taper as prescribed.  Do not take ibuprofen, Advil, Motrin, or Aleve while taking this medication.  You can take 1 to 2 tablets of Tylenol every 6 hours additionally however.  When you are done taking the steroid taper you can alternate 600 mg of ibuprofen and [phone] mg of Tylenol every 3 hours as needed for pain. Do not exceed 4000 mg of Tylenol daily.  Take ibuprofen with food to avoid upset stomach issues.  You can take Flexeril as needed for muscle spasm up to twice daily but do not drive, drink alcohol, or operate heavy machinery while taking this medicine because it may make you drowsy.  I typically recommend taking this medicine only at night when you are going to sleep.  You can also cut these tablets in half if they make you feel very drowsy.  Apply lidocaine patches to areas of pain.  You can take hydrocodone as needed for severe pain but do not drive, drink alcohol, operate heavy machinery while taking this medication as it can cause drowsiness.  Also be aware that this contains Tylenol. 2. Treatment: rest, drink plenty of fluids, gentle stretching as discussed (see attached), alternate ice and heat (or stick with whichever feels best) 20 minutes on 20 minutes off. 3. Follow Up: Please followup with your primary doctor in 3-7 days for discussion of your diagnoses and further evaluation after today's visit; I have also provided information for a neurosurgeon you can follow-up with; return to the ER for worsening back pain, difficulty walking, loss of bowel or bladder control or other concerning symptoms

## 2018-09-20 NOTE — ED Provider Notes (Signed)
Patient transferred from [facility] for evaluation of progressively worsening low back pain with associated radiculopathy.  Pending MRI for rule out of myelopathy.   Physical Exam  BP 113/78 (BP Location: Right Arm)   Pulse 95   Temp 98.3 F (36.8 C) (Oral)   Resp 16   Ht 5\' 3"  (1.6 m)   Wt 69.9 kg   SpO2 98%   BMI 27.28 kg/m   Physical Exam Vitals signs and nursing note reviewed.  Constitutional:      General: She is not in acute distress.    Appearance: She is well-developed.     Comments: Resting comfortably in right lateral decubitus position  HENT:     Head: Normocephalic and atraumatic.  Eyes:     General:        Right eye: No discharge.        Left eye: No discharge.     Conjunctiva/sclera: Conjunctivae normal.  Neck:     Vascular: No JVD.     Trachea: No tracheal deviation.  Cardiovascular:     Rate and Rhythm: Normal rate.  Pulmonary:     Effort: Pulmonary effort is normal.  Abdominal:     General: There is no distension.  Skin:    General: Skin is warm and dry.     Findings: No erythema.  Neurological:     Mental Status: She is alert.     Comments: Fluent speech, no facial droop.  Moves extremity spontaneously without difficulty.  Psychiatric:        Behavior: Behavior normal.       MDM  Mr Lumbar Spine Wo Contrast  Result Date: 09/20/2018 CLINICAL DATA:  Left low back pain for 4-5 weeks. New left leg weakness and left foot numbness. EXAM: MRI LUMBAR SPINE WITHOUT CONTRAST TECHNIQUE: Multiplanar, multisequence MR imaging of the lumbar spine was performed. No intravenous contrast was administered. COMPARISON:  Lumbar spine radiograph 08/25/2018 FINDINGS: Segmentation:  Standard. Alignment:  Normal. Vertebrae: No fracture, suspicious osseous lesion, or significant marrow edema. Subcentimeter hemangioma in the anterior S2 segment on the right. Conus medullaris and cauda equina: Conus extends to the T12-L1 level. Conus and cauda equina appear  normal. Paraspinal and other soft tissues: Unremarkable. Disc levels: Preserved disc height and hydration throughout the lumbar spine. L1-2 and L2-3: Negative. L3-4: At most minimal disc bulging without stenosis. L4-5: Disc bulging, a small left subarticular disc protrusion, and mild facet and ligamentum flavum hypertrophy result in mild spinal stenosis and mild-to-moderate right and moderate left lateral recess stenosis with potential bilateral L5 nerve root impingement, particularly on the left. Patent neural foramina. L5-S1: Minimal disc bulging and facet hypertrophy without stenosis. IMPRESSION: Left greater than right lateral recess stenosis at L4-5 with a small left subarticular disc protrusion contributing. Potential L5 nerve root impingement. Electronically Signed   By: Logan Bores M.D.   On: 09/20/2018 17:31   MRI shows no evidence of cauda equina or spinal abscess.  However does show left greater than right lateral recess stenosis at L4-5 with a small left subarticular disc protrusion with potential L5 nerve root impingement consistent with the patient's symptoms.  She was given lidocaine patch, Toradol, Flexeril in the ED and on my assessment reports that her symptoms have improved.  Ambulatory without difficulty in the ED. Recommend follow-up with PCP for reevaluation and referral to physical therapy.  I will also give her information for neurosurgery for outpatient follow-up.  We discussed management of symptoms with steroid taper,  Flexeril, hydrocodone for severe breakthrough pain.  Discussed appropriate use of medications and potential side effects.  Discussed strict ED return precautions. Patient verbalized understanding of and agreement with plan and is safe for discharge home at this time.        Christina Papa, PA-C 09/20/18 Christina Allen    Christina Pence, MD 09/21/18 2022

## 2018-09-20 NOTE — ED Notes (Signed)
Pt discharged with all belongings. Discharge instructions reviewed with pt, and pt verbalized understanding. Opportunity for questions provided.  

## 2018-11-14 ENCOUNTER — Other Ambulatory Visit: Payer: Self-pay

## 2018-11-14 ENCOUNTER — Ambulatory Visit (HOSPITAL_COMMUNITY)
Admission: EM | Admit: 2018-11-14 | Discharge: 2018-11-14 | Disposition: A | Discharge status: Home / Self Care | Payer: Medicaid Other | Attending: Urgent Care | Admitting: Urgent Care

## 2018-11-14 ENCOUNTER — Encounter (HOSPITAL_COMMUNITY): Payer: Self-pay

## 2018-11-14 DIAGNOSIS — R1030 Lower abdominal pain, unspecified: Secondary | ICD-10-CM

## 2018-11-14 DIAGNOSIS — K581 Irritable bowel syndrome with constipation: Secondary | ICD-10-CM

## 2018-11-14 DIAGNOSIS — R109 Unspecified abdominal pain: Secondary | ICD-10-CM

## 2018-11-14 DIAGNOSIS — K5900 Constipation, unspecified: Secondary | ICD-10-CM

## 2018-11-14 MED ORDER — LUBIPROSTONE 8 MCG PO CAPS: 8.0000 ug | ORAL_CAPSULE | Freq: Two times a day (BID) | ORAL | 0 refills | Status: AC

## 2018-11-14 NOTE — ED Triage Notes (Signed)
Patient reports having abdominal pain and constipation  for 2 days, she is using Miralax.

## 2018-11-14 NOTE — Discharge Instructions (Addendum)
Salads - kale, spinach, cabbage, spring mix; use seeds like pumpkin seeds or sunflower seeds, almonds; you can also use 1-2 hard boiled eggs in your salads Fruits - avocadoes, berries (blueberries, raspberries, blackberries), apples, oranges, pomegranate, grapefruit Vegetables - aspargus, cauliflower, broccoli, green beans, brussel spouts, bell peppers; stay away from starchy vegetables like potatoes, carrots, peas  Regarding meat it is better to eat lean meats and limit your red meat consumption including pork.  Wild caught fish, chicken breast are good options.  Do not eat any foods on this list that you are allergic to.

## 2018-11-14 NOTE — ED Provider Notes (Signed)
MRN: [id number] DOB: March 18, 1975  Subjective:   Christina Allen is a 43 y.o. female presenting for 2 day hx of recurrent lower abdominal pain that is crampy in nature and related to her chronic constipation, IBS-C. She has used Miralax every other day with minimal - some relief. Patient is requesting refill of Amitiza as she had success with this in the past. She plans on setting up PCP care in the next month. Does not like to use enemas. Denies fever, n/v, urinary sx, bloody stools.   No current facility-administered medications for this encounter.   Current Outpatient Medications:  .  albuterol (PROVENTIL HFA;VENTOLIN HFA) 108 (90 Base) MCG/ACT inhaler, Inhale 2 puffs into the lungs every 6 (six) hours as needed for wheezing or shortness of breath., Disp: 1 Inhaler, Rfl: 0 .  calcium-vitamin D (OSCAL WITH D) 500-200 MG-UNIT tablet, Take 1 tablet by mouth daily with breakfast., Disp: , Rfl:  .  Capsaicin-Menthol-Methyl Sal (CAPSAICIN-METHYL SAL-MENTHOL) 0.025-1-12 % CREA, Apply 1 application topically 4 (four) times daily., Disp: 1 Tube, Rfl: 1 .  cetirizine-pseudoephedrine (ZYRTEC-D) 5-120 MG tablet, Take 1 tablet by mouth daily., Disp: 30 tablet, Rfl: 0 .  cyclobenzaprine (FLEXERIL) 10 MG tablet, Take 1 tablet (10 mg total) by mouth 2 (two) times daily as needed., Disp: 10 tablet, Rfl: 0 .  fluticasone (FLONASE) 50 MCG/ACT nasal spray, Place 2 sprays into both nostrils daily., Disp: 16 g, Rfl: 6 .  HYDROcodone-acetaminophen (NORCO/VICODIN) 5-325 MG tablet, Take 1 tablet by mouth every 6 (six) hours as needed for severe pain., Disp: 10 tablet, Rfl: 0 .  ibuprofen (ADVIL) 600 MG tablet, Take 1 tablet (600 mg total) by mouth every 6 (six) hours as needed., Disp: 30 tablet, Rfl: 0 .  loratadine (CLARITIN) 10 MG tablet, Take 1 tablet (10 mg total) by mouth daily., Disp: 30 tablet, Rfl: 11 .  methylPREDNISolone (MEDROL DOSEPAK) 4 MG TBPK tablet, Take by mouth daily. Follow package instructions, Disp: 21  tablet, Rfl: 0 .  montelukast (SINGULAIR) 10 MG tablet, Take 10 mg by mouth at bedtime., Disp: , Rfl:  .  nystatin (MYCOSTATIN/NYSTOP) powder, Apply topically 4 (four) times daily., Disp: 45 g, Rfl: 1 .  pantoprazole (PROTONIX) 40 MG tablet, Take 1 tablet (40 mg total) by mouth daily. Stop ranitidine., Disp: 90 tablet, Rfl: 0 .  polyethylene glycol powder (GLYCOLAX/MIRALAX) powder, Mix 17 gm with glass full of water and take 2-3 times a day, Disp: 525 g, Rfl: 1 .  predniSONE (STERAPRED UNI-PAK 21 TAB) 10 MG (21) TBPK tablet, Take by mouth daily. Take 6 tabs by mouth on day 1, then 5 tabs on day 2, then 4 tabs on day 3, then 3 tabs on day 4, 2 tabs on day 5, then 1 tab on day 6, Disp: 21 tablet, Rfl: 0 .  sertraline (ZOLOFT) 50 MG tablet, TAKE 1 TABLET(50 MG) BY MOUTH DAILY, Disp: 90 tablet, Rfl: 0 .  tiZANidine (ZANAFLEX) 4 MG tablet, Take 1 tablet (4 mg total) by mouth every 8 (eight) hours as needed for muscle spasms., Disp: 30 tablet, Rfl: 0 .  Vitamin D, Ergocalciferol, (DRISDOL) 50000 units CAPS capsule, Take 1 capsule (50,000 Units total) by mouth every 7 (seven) days., Disp: 8 capsule, Rfl: 0    Allergies  Allergen Reactions  . Shellfish-Derived Products Swelling    Mouth swelling Mouth swelling  . Oxycodone-Acetaminophen Nausea And Vomiting    Pt states can only take ibuprofen    Past Medical History:  Diagnosis Date  .  Abnormal Pap smear 2004  . Allergy   . Anal fissure   . Bacterial infection   . Dysmenorrhea   . Endometriosis   . GERD (gastroesophageal reflux disease)   . H/O varicella   . Leukorrhea 04/08/2007   Physiologic  . Menorrhagia   . Pemphigus vulgaris   . Preterm labor   . Trichomonas   . Vaginitis   . Yeast infection      Past Surgical History:  Procedure Laterality Date  . CESAREAN SECTION    . DILATION AND CURETTAGE OF UTERUS    . OTHER SURGICAL HISTORY     tubal ligation and c-section  . TUBAL LIGATION    . WISDOM TOOTH EXTRACTION       ROS  Objective:   Vitals: BP (!) 121/91 (BP Location: Left Arm)   Pulse (!) 113   Temp 98.4 F (36.9 C) (Oral)   Resp 16   SpO2 96%   Pulse was 96 on recheck by PA-Damary Doland.  Physical Exam Constitutional:      General: She is not in acute distress.    Appearance: Normal appearance. She is well-developed and normal weight. She is not ill-appearing, toxic-appearing or diaphoretic.  HENT:     Head: Normocephalic and atraumatic.     Right Ear: External ear normal.     Left Ear: External ear normal.     Nose: Nose normal.     Mouth/Throat:     Mouth: Mucous membranes are moist.     Pharynx: Oropharynx is clear.  Eyes:     General: No scleral icterus.    Extraocular Movements: Extraocular movements intact.     Pupils: Pupils are equal, round, and reactive to light.  Cardiovascular:     Rate and Rhythm: Normal rate and regular rhythm.     Pulses: Normal pulses.     Heart sounds: Normal heart sounds. No murmur. No friction rub. No gallop.   Pulmonary:     Effort: Pulmonary effort is normal. No respiratory distress.     Breath sounds: Normal breath sounds. No stridor. No wheezing, rhonchi or rales.  Abdominal:     General: Bowel sounds are normal. There is no distension.     Palpations: Abdomen is soft. There is no mass.     Tenderness: There is abdominal tenderness in the right upper quadrant and right lower quadrant. There is no right CVA tenderness, left CVA tenderness, guarding or rebound. Negative signs include McBurney's sign.  Skin:    General: Skin is warm and dry.     Coloration: Skin is not pale.     Findings: No rash.  Neurological:     General: No focal deficit present.     Mental Status: She is alert and oriented to person, place, and time.  Psychiatric:        Mood and Affect: Mood normal.        Behavior: Behavior normal.        Thought Content: Thought content normal.        Judgment: Judgment normal.     Assessment and Plan :   1. Constipation,  unspecified constipation type   2. Irritable bowel syndrome with constipation   3. Lower abdominal pain   4. Right sided abdominal pain     Counseled on management of IBS-C, agreed to refill her Amitiza. She is to obtain future refills of this through her PCP. Use enema for persistent constipation. Counseled patient on potential for adverse effects with medications  prescribed/recommended today, ER and return-to-clinic precautions discussed, patient verbalized understanding.    Jaynee Eagles, PA-C 11/14/18 1828

## 2018-11-24 ENCOUNTER — Other Ambulatory Visit: Payer: Self-pay | Admitting: Family Medicine

## 2018-11-24 DIAGNOSIS — Z1231 Encounter for screening mammogram for malignant neoplasm of breast: Secondary | ICD-10-CM

## 2018-12-05 ENCOUNTER — Ambulatory Visit
Admission: RE | Admit: 2018-12-05 | Discharge: 2018-12-05 | Disposition: A | Discharge status: Home / Self Care | Payer: No Typology Code available for payment source | Source: Ambulatory Visit | Attending: Family Medicine | Admitting: Family Medicine

## 2018-12-05 ENCOUNTER — Other Ambulatory Visit: Payer: Self-pay

## 2018-12-05 DIAGNOSIS — Z1231 Encounter for screening mammogram for malignant neoplasm of breast: Secondary | ICD-10-CM

## 2019-05-07 ENCOUNTER — Other Ambulatory Visit: Payer: Self-pay | Admitting: *Deleted

## 2019-05-07 ENCOUNTER — Telehealth: Payer: Self-pay | Admitting: *Deleted

## 2019-05-07 NOTE — Telephone Encounter (Signed)
LVM for pt to call office to confirm her pharmacy.  We received a refill from [facility] on [address] for her Loratadine.  Her current pharmacies listed are [place], [place]. Please confirm if she would like for [place] to send it to the [facility]. April Katharina Caper, CMA

## 2019-06-23 ENCOUNTER — Emergency Department (HOSPITAL_BASED_OUTPATIENT_CLINIC_OR_DEPARTMENT_OTHER)
Admission: EM | Admit: 2019-06-23 | Discharge: 2019-06-23 | Disposition: A | Discharge status: Home / Self Care | Payer: No Typology Code available for payment source | Attending: Emergency Medicine | Admitting: Emergency Medicine

## 2019-06-23 ENCOUNTER — Encounter (HOSPITAL_BASED_OUTPATIENT_CLINIC_OR_DEPARTMENT_OTHER): Payer: Self-pay

## 2019-06-23 ENCOUNTER — Other Ambulatory Visit: Payer: Self-pay

## 2019-06-23 DIAGNOSIS — Z79899 Other long term (current) drug therapy: Secondary | ICD-10-CM | POA: Insufficient documentation

## 2019-06-23 DIAGNOSIS — R1033 Periumbilical pain: Secondary | ICD-10-CM | POA: Insufficient documentation

## 2019-06-23 DIAGNOSIS — Z87891 Personal history of nicotine dependence: Secondary | ICD-10-CM | POA: Insufficient documentation

## 2019-06-23 DIAGNOSIS — Z91013 Allergy to seafood: Secondary | ICD-10-CM | POA: Insufficient documentation

## 2019-06-23 DIAGNOSIS — R109 Unspecified abdominal pain: Secondary | ICD-10-CM

## 2019-06-23 DIAGNOSIS — Z885 Allergy status to narcotic agent status: Secondary | ICD-10-CM | POA: Insufficient documentation

## 2019-06-23 DIAGNOSIS — R112 Nausea with vomiting, unspecified: Secondary | ICD-10-CM | POA: Insufficient documentation

## 2019-06-23 LAB — URINALYSIS, ROUTINE W REFLEX MICROSCOPIC
Bilirubin Urine: NEGATIVE
Glucose, UA: NEGATIVE mg/dL
Ketones, ur: NEGATIVE mg/dL
Leukocytes,Ua: NEGATIVE
Nitrite: NEGATIVE
Protein, ur: NEGATIVE mg/dL
Specific Gravity, Urine: <1.005 — ABNORMAL LOW (ref 1.005–1.030)
pH: 6 (ref 5.0–8.0)

## 2019-06-23 LAB — COMPREHENSIVE METABOLIC PANEL
ALT: 19 U/L (ref 0–44)
AST: 15 U/L (ref 15–41)
Albumin: 4 g/dL (ref 3.5–5.0)
Alkaline Phosphatase: 68 U/L (ref 38–126)
Anion gap: 8 (ref 5–15)
BUN: 13 mg/dL (ref 6–20)
CO2: 23 mmol/L (ref 22–32)
Calcium: 9.1 mg/dL (ref 8.9–10.3)
Chloride: 105 mmol/L (ref 98–111)
Creatinine, Ser: 0.93 mg/dL (ref 0.44–1.00)
GFR calc Af Amer: >60 mL/min (ref >60)
GFR calc non Af Amer: >60 mL/min (ref >60)
Glucose, Bld: 95 mg/dL (ref 70–99)
Potassium: 4.4 mmol/L (ref 3.5–5.1)
Sodium: 136 mmol/L (ref 135–145)
Total Bilirubin: 0.5 mg/dL (ref 0.3–1.2)
Total Protein: 7.4 g/dL (ref 6.5–8.1)

## 2019-06-23 LAB — CBC WITH DIFFERENTIAL/PLATELET
Abs Immature Granulocytes: 0.01 10*3/uL (ref 0.00–0.07)
Basophils Absolute: 0 10*3/uL (ref 0.0–0.1)
Basophils Relative: 0 %
Eosinophils Absolute: 0 10*3/uL (ref 0.0–0.5)
Eosinophils Relative: 0 %
HCT: 37.6 % (ref 36.0–46.0)
Hemoglobin: 13 g/dL (ref 12.0–15.0)
Immature Granulocytes: 0 %
Lymphocytes Relative: 16 %
Lymphs Abs: 1.2 10*3/uL (ref 0.7–4.0)
MCH: 32.9 pg (ref 26.0–34.0)
MCHC: 34.6 g/dL (ref 30.0–36.0)
MCV: 95.2 fL (ref 80.0–100.0)
Monocytes Absolute: 0.5 10*3/uL (ref 0.1–1.0)
Monocytes Relative: 7 %
Neutro Abs: 5.7 10*3/uL (ref 1.7–7.7)
Neutrophils Relative %: 77 %
Platelets: 364 10*3/uL (ref 150–400)
RBC: 3.95 MIL/uL (ref 3.87–5.11)
RDW: 11.9 % (ref 11.5–15.5)
WBC: 7.5 10*3/uL (ref 4.0–10.5)
nRBC: 0 % (ref 0.0–0.2)

## 2019-06-23 LAB — URINALYSIS, MICROSCOPIC (REFLEX)

## 2019-06-23 LAB — LIPASE, BLOOD: Lipase: 19 U/L (ref 11–51)

## 2019-06-23 LAB — PREGNANCY, URINE: Preg Test, Ur: NEGATIVE

## 2019-06-23 MED ORDER — ONDANSETRON HCL 4 MG/2ML IJ SOLN
INTRAMUSCULAR | Status: AC
  Filled 2019-06-23: qty 2

## 2019-06-23 MED ORDER — ONDANSETRON HCL 4 MG/2ML IJ SOLN
4.0000 mg | Freq: Once | INTRAMUSCULAR | Status: AC
  Administered 2019-06-23: 4 mg via INTRAVENOUS

## 2019-06-23 MED ORDER — ONDANSETRON HCL 4 MG PO TABS: 4.0000 mg | ORAL_TABLET | Freq: Three times a day (TID) | ORAL | 0 refills | Status: AC | PRN

## 2019-06-23 MED ORDER — DICYCLOMINE HCL 20 MG PO TABS: 20.0000 mg | ORAL_TABLET | Freq: Two times a day (BID) | ORAL | 0 refills | Status: AC | PRN

## 2019-06-23 MED ORDER — DICYCLOMINE HCL 10 MG PO CAPS
10.0000 mg | ORAL_CAPSULE | Freq: Once | ORAL | Status: AC
  Administered 2019-06-23: 10 mg via ORAL
  Filled 2019-06-23: qty 1

## 2019-06-23 MED ORDER — SODIUM CHLORIDE 0.9 % IV BOLUS
1000.0000 mL | Freq: Once | INTRAVENOUS | Status: AC
  Administered 2019-06-23: 1000 mL via INTRAVENOUS

## 2019-06-23 MED ORDER — MORPHINE SULFATE (PF) 4 MG/ML IV SOLN
4.0000 mg | Freq: Once | INTRAVENOUS | Status: AC
  Administered 2019-06-23: 4 mg via INTRAVENOUS

## 2019-06-23 MED ORDER — MORPHINE SULFATE (PF) 4 MG/ML IV SOLN
INTRAVENOUS | Status: AC
  Filled 2019-06-23: qty 1

## 2019-06-23 MED FILL — DICYCLOMINE 20 MG TABLET: 20 | 10 days supply | Qty: 20 | Fill #0

## 2019-06-23 MED FILL — ONDANSETRON HCL 4 MG TABLET: 4 | 4 days supply | Qty: 12 | Fill #0

## 2019-06-23 NOTE — ED Notes (Signed)
PT on all fours in bed, hollering in room. Called out for pain med's and informed her that I would let her care team know. RN at bedside

## 2019-06-23 NOTE — Discharge Instructions (Signed)
Use Zofran as needed for nausea or vomiting. Use Bentyl as needed for abdominal cramping or spasm. Use Tylenol as needed for abdominal pain. Make sure stay well-hydrated water. Eat a bland diet until your symptoms resolve. I recommend you take your reflux medication every day until your symptoms completely resolved. Return to the emergency room if you develop high fevers, persistent vomiting despite medication, severe worsening abdominal pain, or any new, worsening, or concerning symptoms.

## 2019-06-23 NOTE — ED Provider Notes (Signed)
[facility] Provider Note   CSN: [id number] Arrival date & time: 06/23/19  1250     History Chief Complaint  Patient presents with  . Abdominal Pain    [facility] is a 44 y.o. female presented for evaluation of nausea, vomiting, domino pain.  Patient states she started to feel nauseous yesterday.  She then developed some upper abdominal pain.  Today, she has periumbilical abdominal pain which she describes as severe.  She has had one episode of emesis this morning, has not thrown up since.  No blood in her emesis.  She reports having a normal bowel movement this morning, no blood in her stool.  She denies fevers, chills, chest pain, shortness breath, cough.  She reports some mild dysuria, otherwise no urinary symptoms including hematuria or urinary frequency.  She has not taken anything for her symptoms including Tylenol ibuprofen.  Patient states she has a history of reflux which is controlled with diet, she did take one of her reflux medications today, but promptly threw it up.  She has a history of previous C-sections and tubal ligation, no other abdominal surgeries.  She denies sick contacts.  Patient states she no longer has periods due to Nexplanon.  She denies vaginal discharge.  She states she is not sexually active.  She denies alcohol, drug, or tobacco use.  Additional history obtained from chart review.  Patient with a history of dysmenorrhea, endometriosis, GERD.   HPI     Past Medical History:  Diagnosis Date  . Abnormal Pap smear 2004  . Allergy   . Anal fissure   . Bacterial infection   . Dysmenorrhea   . Endometriosis   . GERD (gastroesophageal reflux disease)   . H/O varicella   . Leukorrhea 04/08/2007   Physiologic  . Menorrhagia   . Pemphigus vulgaris   . Preterm labor   . Trichomonas   . Vaginitis   . Yeast infection     Patient Active Problem List   Diagnosis Date Noted  . Neck and shoulder pain 10/15/2017  . Pinguecula  of left eye 07/17/2017  . Right shoulder tendinitis 06/24/2017  . Non-intractable vomiting with nausea 06/18/2017  . Dyspepsia 06/18/2017  . Constipation 06/18/2017  . Insomnia 05/20/2017  . Mood disorder ([place]) 04/15/2017  . Rotator cuff syndrome of left shoulder 03/26/2017  . Candidiasis, intertrigo 03/26/2017  . Screening for HIV (human immunodeficiency virus) 03/21/2017  . Stress incontinence in female 11/17/2015  . Macromastia 06/03/2015  . Obesity 04/30/2014  . Allergic rhinitis 01/25/2012  . GERD (gastroesophageal reflux disease) 01/25/2012  . Irritable bowel syndrome 01/25/2012  . Pemphigus vulgaris 01/25/2012  . History of tobacco abuse 01/25/2012  . Annual physical exam 01/25/2012  . History of B12 deficiency 07/25/2011  . Vitamin D deficiency 05/29/2010    Past Surgical History:  Procedure Laterality Date  . CESAREAN SECTION    . DILATION AND CURETTAGE OF UTERUS    . OTHER SURGICAL HISTORY     tubal ligation and c-section  . TUBAL LIGATION    . WISDOM TOOTH EXTRACTION       OB History    Gravida  3   Para  3   Term      Preterm      AB      Living  3     SAB      TAB      Ectopic      Multiple  Live Births  3           Family History  Problem Relation Age of Onset  . COPD Maternal Grandmother        Emphysema  . Cancer Maternal Grandmother   . Cancer Maternal Aunt   . Cancer Paternal Grandmother   . Hypertension Mother   . Kidney disease Mother   . Hypertension Father   . Alcohol abuse Father   . Deep vein thrombosis Father   . Alcohol abuse Sister   . Alcohol abuse Brother   . Diabetes Maternal Uncle     Social History   Tobacco Use  . Smoking status: Former Smoker    Quit date: 03/06/2003    Years since quitting: 16.3  . Smokeless tobacco: Never Used  Substance Use Topics  . Alcohol use: No  . Drug use: No    Home Medications Prior to Admission medications   Medication Sig Start Date End Date Taking?  Authorizing Provider  albuterol (PROVENTIL HFA;VENTOLIN HFA) 108 (90 Base) MCG/ACT inhaler Inhale 2 puffs into the lungs every 6 (six) hours as needed for wheezing or shortness of breath. 11/17/15   [name], MD  calcium-vitamin D (OSCAL WITH D) 500-200 MG-UNIT tablet Take 1 tablet by mouth daily with breakfast.    [provider]  Capsaicin-Menthol-Methyl Sal (CAPSAICIN-METHYL SAL-MENTHOL) 0.025-1-12 % CREA Apply 1 application topically 4 (four) times daily. 11/01/17   Lind Covert, MD  dicyclomine (BENTYL) 20 MG tablet Take 1 tablet (20 mg total) by mouth 2 (two) times daily as needed for spasms. 06/23/19   Alexx Giambra, PA-C  fluticasone (FLONASE) 50 MCG/ACT nasal spray Place 2 sprays into both nostrils daily. 03/26/17   [name], MD  loratadine (CLARITIN) 10 MG tablet Take 1 tablet (10 mg total) by mouth daily. 04/30/18   Kathrene Alu, MD  lubiprostone (AMITIZA) 8 MCG capsule Take 1 capsule (8 mcg total) by mouth 2 (two) times daily with a meal. 11/14/18   Jaynee Eagles, PA-C  montelukast (SINGULAIR) 10 MG tablet Take 10 mg by mouth at bedtime.    [provider]  nystatin (MYCOSTATIN/NYSTOP) powder Apply topically 4 (four) times daily. 02/05/18   Kathrene Alu, MD  ondansetron (ZOFRAN) 4 MG tablet Take 1 tablet (4 mg total) by mouth every 8 (eight) hours as needed for nausea or vomiting. 06/23/19   Elliemae Braman, PA-C  pantoprazole (PROTONIX) 40 MG tablet Take 1 tablet (40 mg total) by mouth daily. Stop ranitidine. 06/18/17   [name], MD  polyethylene glycol powder (GLYCOLAX/MIRALAX) powder Mix 17 gm with glass full of water and take 2-3 times a day 06/18/17   [name], MD  Vitamin D, Ergocalciferol, (DRISDOL) 50000 units CAPS capsule Take 1 capsule (50,000 Units total) by mouth every 7 (seven) days. 04/03/17   [name], MD  sertraline (ZOLOFT) 50 MG tablet TAKE 1 TABLET(50 MG) BY MOUTH DAILY 05/20/17 11/14/18  [name], MD     Allergies    Shellfish-derived products and Oxycodone-acetaminophen  Review of Systems   Review of Systems  Gastrointestinal: Positive for abdominal pain, nausea and vomiting.  All other systems reviewed and are negative.   Physical Exam Updated Vital Signs BP 118/90 (BP Location: Left Arm)   Pulse 87   Temp 98.2 F (36.8 C) (Oral)   Resp 18   Ht 5\' 3"  (1.6 m)   Wt 72.6 kg   SpO2 100%   BMI 28.34  kg/m   Physical Exam Vitals and nursing note reviewed.  Constitutional:      General: She is not in acute distress.    Appearance: She is well-developed.     Comments: Appears uncomfortable due to pain, otherwise nontoxic  HENT:     Head: Normocephalic and atraumatic.  Eyes:     Conjunctiva/sclera: Conjunctivae normal.     Pupils: Pupils are equal, round, and reactive to light.  Cardiovascular:     Rate and Rhythm: Normal rate and regular rhythm.     Pulses: Normal pulses.  Pulmonary:     Effort: Pulmonary effort is normal. No respiratory distress.     Breath sounds: Normal breath sounds. No wheezing.  Abdominal:     General: There is no distension.     Palpations: Abdomen is soft. There is no mass.     Tenderness: There is abdominal tenderness. There is no guarding or rebound.     Comments: Mild generalized ttp of the abd without focal pain. No rigidity, guarding, distention. Negative rebound. No CVA tenderness.   Musculoskeletal:        General: Normal range of motion.     Cervical back: Normal range of motion and neck supple.     Right lower leg: No edema.     Left lower leg: No edema.  Skin:    General: Skin is warm and dry.     Capillary Refill: Capillary refill takes less than 2 seconds.  Neurological:     Mental Status: She is alert and oriented to person, place, and time.     ED Results / Procedures / Treatments   Labs (all labs ordered are listed, but only abnormal results are displayed) Labs Reviewed  URINALYSIS, ROUTINE W REFLEX MICROSCOPIC -  Abnormal; Notable for the following components:      Result Value   Specific Gravity, Urine <1.005 (*)    Hgb urine dipstick SMALL (*)    All other components within normal limits  URINALYSIS, MICROSCOPIC (REFLEX) - Abnormal; Notable for the following components:   Bacteria, UA FEW (*)    All other components within normal limits  CBC WITH DIFFERENTIAL/PLATELET  COMPREHENSIVE METABOLIC PANEL  LIPASE, BLOOD  PREGNANCY, URINE    EKG None  Radiology No results found.  Procedures Procedures (including critical care time)  Medications Ordered in ED Medications  ondansetron (ZOFRAN) injection 4 mg (4 mg Intravenous Given 06/23/19 1335)  morphine 4 MG/ML injection 4 mg (4 mg Intravenous Given 06/23/19 1335)  sodium chloride 0.9 % bolus 1,000 mL (0 mLs Intravenous Stopped 06/23/19 1433)  dicyclomine (BENTYL) capsule 10 mg (10 mg Oral Given 06/23/19 1527)    ED Course  I have reviewed the triage vital signs and the nursing notes.  Pertinent labs & imaging results that were available during my care of the patient were reviewed by me and considered in my medical decision making (see chart for details).    MDM Rules/Calculators/A&P                      Patient presenting for evaluation of nausea, vomiting, abdominal pain.  On exam, patient appears uncomfortable due to pain, otherwise nontoxic.  Mild generalized abdominal tenderness.  She denies fevers, and with a nonspecific exam, low suspicion for intra-abdominal infection, perforation, structural, surgical abdomen.  Likely viral GI illness.  Also consider UTI.  Less likely GU pathology without vaginal discharge and patient denying sexual activity.  Will obtain labs, treat symptomatically, and  reassess.  On reassessment after pain and nausea control, patient appears much more comfortable.  She states she is no longer having any pain.  Repeat exam is reassuring, no specific tenderness.  Labs pending.  Labs interpreted by me, overall  reassuring.  No leukocytosis.  Kidney, liver, pancreatic function normal.  Electrolytes stable.  Pregnancy negative.  Urine without signs of infection.  Will p.o. challenge and reassess.  Patient tolerated p.o. without difficulty.  She did has mild cramping after eating, Bentyl given.  Discussed likely viral GI illness.  Discussed continued symptomatic treatment at home.  Discussed prompt return to the ER with signs of infection including fevers, persistent vomiting, worsening pain.  Discussed with patient that at this time I do not believe CT is necessary today, patient agreed.  At this time, patient appears safe for discharge.  Return precautions given.  Patient states she understands and agrees to plan.  Final Clinical Impression(s) / ED Diagnoses Final diagnoses:  Non-intractable vomiting with nausea, unspecified vomiting type  Abdominal cramping    Rx / DC Orders ED Discharge Orders         Ordered    dicyclomine (BENTYL) 20 MG tablet  2 times daily PRN     06/23/19 1540    ondansetron (ZOFRAN) 4 MG tablet  Every 8 hours PRN     06/23/19 [place], Vinayak Bobier, PA-C 06/23/19 1554    Hayden Rasmussen, MD 06/23/19 1729

## 2019-06-23 NOTE — ED Notes (Signed)
Per EDP order, pt given fluids and/or food for PO challenge. Pt verbalized understanding to utilize call bell if nausea or emesis occur. 

## 2019-06-23 NOTE — ED Triage Notes (Signed)
Pt reports abdominal pain starting yesterday. Pt is tearful in triage.

## 2019-08-13 ENCOUNTER — Encounter (HOSPITAL_BASED_OUTPATIENT_CLINIC_OR_DEPARTMENT_OTHER): Payer: Self-pay | Admitting: *Deleted

## 2019-08-13 ENCOUNTER — Emergency Department (HOSPITAL_BASED_OUTPATIENT_CLINIC_OR_DEPARTMENT_OTHER)
Admission: EM | Admit: 2019-08-13 | Discharge: 2019-08-13 | Disposition: A | Discharge status: Home / Self Care | Payer: No Typology Code available for payment source | Attending: Emergency Medicine | Admitting: Emergency Medicine

## 2019-08-13 ENCOUNTER — Other Ambulatory Visit: Payer: Self-pay

## 2019-08-13 DIAGNOSIS — K0889 Other specified disorders of teeth and supporting structures: Secondary | ICD-10-CM

## 2019-08-13 DIAGNOSIS — Z9889 Other specified postprocedural states: Secondary | ICD-10-CM | POA: Insufficient documentation

## 2019-08-13 DIAGNOSIS — Z87891 Personal history of nicotine dependence: Secondary | ICD-10-CM | POA: Insufficient documentation

## 2019-08-13 MED ORDER — OXYCODONE-ACETAMINOPHEN 5-325 MG PO TABS
1.0000 | ORAL_TABLET | Freq: Once | ORAL | Status: AC
  Administered 2019-08-13: 1 via ORAL
  Filled 2019-08-13: qty 1

## 2019-08-13 NOTE — Discharge Instructions (Signed)
Please call your dentist tomorrow to arrange for follow-up appointment. Please use Tylenol or ibuprofen for pain.  You may use 600 mg ibuprofen every 6 hours or 1000 mg of Tylenol every 6 hours.  You may choose to alternate between the 2.  This would be most effective.  Not to exceed 4 g of Tylenol within 24 hours.  Not to exceed 3200 mg ibuprofen 24 hours.

## 2019-08-13 NOTE — ED Provider Notes (Signed)
[facility] Provider Note   CSN: [id number] Arrival date & time: 08/13/19  2045     History Chief Complaint  Patient presents with  . Dental Pain    [facility] is a 44 y.o. female.  HPI  Patient is a 44 year old female with past medical history detailed below presented today for left lower dental pain that began today after she had a tooth filled.  She states that it is achy, severe, worse with touch and chewing.  She states she has taken no pain medication prior to arrival apart from a hydrocodone that she had leftover from a prior appointment.  She states that she still has pain however.  She states that her tooth was pulled at 4 PM and states that she took the hydrocodone approximately 7 PM.  She denies any fevers, chills, nausea, vomiting.  She states she called the dentist office but got no answer. She denies any difficulty swallowing and states that she is able to open and close her mouth without difficulty move her tongue.    Past Medical History:  Diagnosis Date  . Abnormal Pap smear 2004  . Allergy   . Anal fissure   . Bacterial infection   . Dysmenorrhea   . Endometriosis   . GERD (gastroesophageal reflux disease)   . H/O varicella   . Leukorrhea 04/08/2007   Physiologic  . Menorrhagia   . Pemphigus vulgaris   . Preterm labor   . Trichomonas   . Vaginitis   . Yeast infection     Patient Active Problem List   Diagnosis Date Noted  . Neck and shoulder pain 10/15/2017  . Pinguecula of left eye 07/17/2017  . Right shoulder tendinitis 06/24/2017  . Non-intractable vomiting with nausea 06/18/2017  . Dyspepsia 06/18/2017  . Constipation 06/18/2017  . Insomnia 05/20/2017  . Mood disorder ([place]) 04/15/2017  . Rotator cuff syndrome of left shoulder 03/26/2017  . Candidiasis, intertrigo 03/26/2017  . Screening for HIV (human immunodeficiency virus) 03/21/2017  . Stress incontinence in female 11/17/2015  . Macromastia 06/03/2015  .  Obesity 04/30/2014  . Allergic rhinitis 01/25/2012  . GERD (gastroesophageal reflux disease) 01/25/2012  . Irritable bowel syndrome 01/25/2012  . Pemphigus vulgaris 01/25/2012  . History of tobacco abuse 01/25/2012  . Annual physical exam 01/25/2012  . History of B12 deficiency 07/25/2011  . Vitamin D deficiency 05/29/2010    Past Surgical History:  Procedure Laterality Date  . CESAREAN SECTION    . DILATION AND CURETTAGE OF UTERUS    . OTHER SURGICAL HISTORY     tubal ligation and c-section  . TUBAL LIGATION    . WISDOM TOOTH EXTRACTION       OB History    Gravida  3   Para  3   Term      Preterm      AB      Living  3     SAB      TAB      Ectopic      Multiple      Live Births  3           Family History  Problem Relation Age of Onset  . COPD Maternal Grandmother        Emphysema  . Cancer Maternal Grandmother   . Cancer Maternal Aunt   . Cancer Paternal Grandmother   . Hypertension Mother   . Kidney disease Mother   . Hypertension Father   .  Alcohol abuse Father   . Deep vein thrombosis Father   . Alcohol abuse Sister   . Alcohol abuse Brother   . Diabetes Maternal Uncle     Social History   Tobacco Use  . Smoking status: Former Smoker    Quit date: 03/06/2003    Years since quitting: 16.4  . Smokeless tobacco: Never Used  Vaping Use  . Vaping Use: Never used  Substance Use Topics  . Alcohol use: No  . Drug use: No    Home Medications Prior to Admission medications   Medication Sig Start Date End Date Taking? Authorizing Provider  albuterol (PROVENTIL HFA;VENTOLIN HFA) 108 (90 Base) MCG/ACT inhaler Inhale 2 puffs into the lungs every 6 (six) hours as needed for wheezing or shortness of breath. 11/17/15   [name], MD  calcium-vitamin D (OSCAL WITH D) 500-200 MG-UNIT tablet Take 1 tablet by mouth daily with breakfast.    [provider]  Capsaicin-Menthol-Methyl Sal (CAPSAICIN-METHYL SAL-MENTHOL) 0.025-1-12 % CREA  Apply 1 application topically 4 (four) times daily. 11/01/17   Lind Covert, MD  dicyclomine (BENTYL) 20 MG tablet Take 1 tablet (20 mg total) by mouth 2 (two) times daily as needed for spasms. 06/23/19   Caccavale, Sophia, PA-C  fluticasone (FLONASE) 50 MCG/ACT nasal spray Place 2 sprays into both nostrils daily. 03/26/17   [name], MD  loratadine (CLARITIN) 10 MG tablet Take 1 tablet (10 mg total) by mouth daily. 04/30/18   Kathrene Alu, MD  lubiprostone (AMITIZA) 8 MCG capsule Take 1 capsule (8 mcg total) by mouth 2 (two) times daily with a meal. 11/14/18   Jaynee Eagles, PA-C  montelukast (SINGULAIR) 10 MG tablet Take 10 mg by mouth at bedtime.    [provider]  nystatin (MYCOSTATIN/NYSTOP) powder Apply topically 4 (four) times daily. 02/05/18   Kathrene Alu, MD  ondansetron (ZOFRAN) 4 MG tablet Take 1 tablet (4 mg total) by mouth every 8 (eight) hours as needed for nausea or vomiting. 06/23/19   Caccavale, Sophia, PA-C  pantoprazole (PROTONIX) 40 MG tablet Take 1 tablet (40 mg total) by mouth daily. Stop ranitidine. 06/18/17   [name], MD  polyethylene glycol powder (GLYCOLAX/MIRALAX) powder Mix 17 gm with glass full of water and take 2-3 times a day 06/18/17   [name], MD  Vitamin D, Ergocalciferol, (DRISDOL) 50000 units CAPS capsule Take 1 capsule (50,000 Units total) by mouth every 7 (seven) days. 04/03/17   [name], MD  sertraline (ZOLOFT) 50 MG tablet TAKE 1 TABLET(50 MG) BY MOUTH DAILY 05/20/17 11/14/18  [name], MD    Allergies    Shellfish-derived products and Oxycodone-acetaminophen  Review of Systems   Review of Systems  Constitutional: Negative for fever.  HENT: Positive for dental problem. Negative for congestion.   Respiratory: Negative for shortness of breath.   Cardiovascular: Negative for chest pain.  Gastrointestinal: Negative for abdominal distention.  Neurological: Negative for dizziness and headaches.    Physical  Exam Updated Vital Signs BP (!) 135/98 (BP Location: Right Arm)   Pulse 96   Temp 98.6 F (37 C) (Oral)   Resp 18   Ht 5\' 3"  (1.6 m)   Wt 76.2 kg   SpO2 99%   BMI 29.76 kg/m   Physical Exam Vitals and nursing note reviewed.  Constitutional:      General: She is not in acute distress.    Appearance: Normal appearance. She is not ill-appearing.  HENT:     Head: Normocephalic and atraumatic.     Mouth/Throat:      Comments: Overall dentition with no obvious caries.  There is no obvious inflammation of the gums.  Tongue with full range of motion.  No posterior pharynx erythema.  No trismus able to open and close mouth without difficulty.  Normal phonation and swallowing. Eyes:     General: No scleral icterus.       Right eye: No discharge.        Left eye: No discharge.     Conjunctiva/sclera: Conjunctivae normal.  Pulmonary:     Effort: Pulmonary effort is normal.     Breath sounds: No stridor.  Neurological:     Mental Status: She is alert and oriented to person, place, and time. Mental status is at baseline.     ED Results / Procedures / Treatments   Labs (all labs ordered are listed, but only abnormal results are displayed) Labs Reviewed - No data to display  EKG None  Radiology No results found.  Procedures Procedures (including critical care time)  Medications Ordered in ED Medications - No data to display  ED Course  I have reviewed the triage vital signs and the nursing notes.  Pertinent labs & imaging results that were available during my care of the patient were reviewed by me and considered in my medical decision making (see chart for details).    MDM Rules/Calculators/A&P                          Patient with tooth that was filled at her dentist today.  She states that she has continued pain after the procedure.  She has taken no Tylenol or ibuprofen however she has taken 1 Norco.  She states that she still has pain.  Physical exam is  unremarkable.  She does have a tooth that has a filling present.  She has no evidence of infection, periapical abscess, or other acute abnormality.  She has full range of motion of her tongue.  Doubt Ludwig's.  Doubt mucomycosis.  She is well-appearing will discharge at this time with Tylenol and ibuprofen recommendations given that she has not tried either of these medications. Will follow up with her dentist.  Final Clinical Impression(s) / ED Diagnoses Final diagnoses:  Pain, dental    Rx / DC Orders ED Discharge Orders    None       Tedd Sias, [place] 08/13/19 2125    Margette Fast, MD 08/18/19 1950

## 2019-08-13 NOTE — ED Triage Notes (Signed)
Pt reports having a filling today at her dentist office, and has had severe pain since then. Pt states she tried to call back to her dentist office, but did not get an answer.

## 2019-12-16 ENCOUNTER — Telehealth: Payer: Self-pay

## 2019-12-16 NOTE — Telephone Encounter (Signed)
Patient calls nurse line requesting school form to be filled out. Patient has not been seen since 10/2017. Apt made with team, as PCP is not available until after Nov 1 deadline.

## 2019-12-30 ENCOUNTER — Ambulatory Visit: Payer: No Typology Code available for payment source | Admitting: Family Medicine

## 2021-08-08 ENCOUNTER — Encounter: Payer: Self-pay | Admitting: *Deleted

## 2023-03-14 ENCOUNTER — Telehealth: Payer: Self-pay

## 2023-03-14 NOTE — Telephone Encounter (Signed)
 The patient called in and left a voicemail requesting us  to give her a call back. I called her letting her know we received her message, and the patient stated that she was calling us  back. She said that the reason why her referral had sent her because her GI didn't have any opening and she was not going to [facility]. I inform her that our practice is not taking established patients.

## 2023-05-15 ENCOUNTER — Telehealth: Payer: Self-pay

## 2023-05-15 NOTE — Telephone Encounter (Signed)
 Pt requesting call back to schedule colonoscopy.

## 2023-05-16 NOTE — Telephone Encounter (Signed)
 Referral was received for patient in Sept 2024 to schedule colonoscopy, however she has just recently reached back out to [place] to schedule.  Informed her that she is a patient of [facility].  She replied that she was but since she has moved she is looking for someone locally to take on her GI Care and perform her colonoscopy.  Explained to her that unfortunately due to staff changes we are not taking new patients at this time and offered to contact her pcp to ask if they could send her referral to [facility] or [facility].  Pt agreed to having me contact PCP on her behalf.  Dr. Caro Hight office has been contacted to request referral to be sent to another GI office locally for her.  Thanks,  [place], [place]
